# Patient Record
Sex: Female | Born: 1945 | Race: Black or African American | Hispanic: No | Marital: Married | Smoking: Never smoker
Health system: Southern US, Community
[De-identification: ages and names within clinical notes are randomized; demographics above are authoritative.]

## PROBLEM LIST (undated history)

## (undated) DIAGNOSIS — M1990 Unspecified osteoarthritis, unspecified site: Secondary | ICD-10-CM

## (undated) DIAGNOSIS — S0003XA Contusion of scalp, initial encounter: Secondary | ICD-10-CM

## (undated) DIAGNOSIS — Z973 Presence of spectacles and contact lenses: Secondary | ICD-10-CM

## (undated) DIAGNOSIS — Z9289 Personal history of other medical treatment: Secondary | ICD-10-CM

## (undated) DIAGNOSIS — I4819 Other persistent atrial fibrillation: Secondary | ICD-10-CM

## (undated) DIAGNOSIS — E785 Hyperlipidemia, unspecified: Secondary | ICD-10-CM

## (undated) DIAGNOSIS — I10 Essential (primary) hypertension: Secondary | ICD-10-CM

## (undated) DIAGNOSIS — Z90710 Acquired absence of both cervix and uterus: Secondary | ICD-10-CM

## (undated) DIAGNOSIS — T148XXA Other injury of unspecified body region, initial encounter: Secondary | ICD-10-CM

## (undated) DIAGNOSIS — Z8719 Personal history of other diseases of the digestive system: Secondary | ICD-10-CM

## (undated) HISTORY — DX: Personal history of other diseases of the digestive system: Z87.19

## (undated) HISTORY — DX: Other injury of unspecified body region, initial encounter: T14.8XXA

## (undated) HISTORY — DX: Other persistent atrial fibrillation: I48.19

## (undated) HISTORY — DX: Personal history of other medical treatment: Z92.89

## (undated) HISTORY — DX: Hyperlipidemia, unspecified: E78.5

## (undated) HISTORY — PX: ABDOMINAL HYSTERECTOMY: SHX81

## (undated) HISTORY — DX: Presence of spectacles and contact lenses: Z97.3

## (undated) HISTORY — DX: Unspecified osteoarthritis, unspecified site: M19.90

## (undated) HISTORY — PX: POLYPECTOMY: SHX149

## (undated) HISTORY — DX: Essential (primary) hypertension: I10

## (undated) HISTORY — DX: Contusion of scalp, initial encounter: S00.03XA

## (undated) HISTORY — DX: Acquired absence of both cervix and uterus: Z90.710

## (undated) HISTORY — PX: CHOLECYSTECTOMY: SHX55

---

## 1997-03-09 DIAGNOSIS — Z872 Personal history of diseases of the skin and subcutaneous tissue: Secondary | ICD-10-CM

## 1997-03-09 HISTORY — DX: Other specified postprocedural states: Z87.2

## 1997-03-09 HISTORY — PX: BREAST BIOPSY: SHX20

## 1999-03-10 ENCOUNTER — Encounter: Payer: Self-pay | Admitting: Nurse Practitioner

## 2007-03-10 HISTORY — PX: COLONOSCOPY: SHX174

## 2008-01-16 ENCOUNTER — Encounter: Payer: Self-pay | Admitting: Nurse Practitioner

## 2008-01-16 LAB — HM COLONOSCOPY

## 2014-03-09 HISTORY — PX: VENTRAL HERNIA REPAIR: SHX424

## 2014-08-31 LAB — HM DEXA SCAN: HM Dexa Scan: NORMAL

## 2015-03-10 HISTORY — PX: GALLBLADDER SURGERY: SHX652

## 2015-10-15 LAB — HM MAMMOGRAPHY

## 2019-01-30 ENCOUNTER — Other Ambulatory Visit: Payer: Self-pay

## 2019-01-30 DIAGNOSIS — Z20822 Contact with and (suspected) exposure to covid-19: Secondary | ICD-10-CM

## 2019-01-31 LAB — NOVEL CORONAVIRUS, NAA: SARS-CoV-2, NAA: NOT DETECTED

## 2019-05-29 ENCOUNTER — Other Ambulatory Visit: Payer: Self-pay | Admitting: Primary Care

## 2019-05-29 ENCOUNTER — Other Ambulatory Visit: Payer: Self-pay | Admitting: Student

## 2019-05-29 DIAGNOSIS — G9389 Other specified disorders of brain: Secondary | ICD-10-CM

## 2019-06-06 ENCOUNTER — Ambulatory Visit
Admission: RE | Admit: 2019-06-06 | Discharge: 2019-06-06 | Disposition: A | Discharge status: Home / Self Care | Payer: Federal, State, Local not specified - PPO | Source: Ambulatory Visit | Attending: Primary Care | Admitting: Primary Care

## 2019-06-06 DIAGNOSIS — G9389 Other specified disorders of brain: Secondary | ICD-10-CM

## 2019-07-07 ENCOUNTER — Ambulatory Visit (INDEPENDENT_AMBULATORY_CARE_PROVIDER_SITE_OTHER): Payer: Medicare Other | Admitting: Nurse Practitioner

## 2019-07-07 ENCOUNTER — Other Ambulatory Visit: Payer: Self-pay

## 2019-07-07 ENCOUNTER — Encounter: Payer: Self-pay | Admitting: Nurse Practitioner

## 2019-07-07 VITALS — BP 122/78 | HR 86 | Temp 97.8°F | Ht 67.0 in | Wt 273.0 lb

## 2019-07-07 DIAGNOSIS — M542 Cervicalgia: Secondary | ICD-10-CM | POA: Diagnosis not present

## 2019-07-07 DIAGNOSIS — E2839 Other primary ovarian failure: Secondary | ICD-10-CM | POA: Diagnosis not present

## 2019-07-07 DIAGNOSIS — Z1231 Encounter for screening mammogram for malignant neoplasm of breast: Secondary | ICD-10-CM | POA: Diagnosis not present

## 2019-07-07 DIAGNOSIS — L729 Follicular cyst of the skin and subcutaneous tissue, unspecified: Secondary | ICD-10-CM

## 2019-07-07 DIAGNOSIS — E782 Mixed hyperlipidemia: Secondary | ICD-10-CM

## 2019-07-07 DIAGNOSIS — I10 Essential (primary) hypertension: Secondary | ICD-10-CM | POA: Diagnosis not present

## 2019-07-07 NOTE — Progress Notes (Signed)
Careteam: Patient Care Team: Lauree Chandler, NP as PCP - General (Geriatric Medicine)  PLACE OF SERVICE:  [place] Directive information Does Patient Have a Medical Advance Directive?: Yes, Type of Advance Directive: Living will, Does patient want to make changes to medical advance directive?: No - Patient declined  No Known Allergies  Chief Complaint  Patient presents with  . Establish Care    New  patient to estabalish care     HPI: Patient is a 74 y.o. female for new patient appt. She is from the area but just recently moved back into town.  Last CPE was feb 2020 and has only had virtual visits since that time.   htn- controlled on amlodipine 5 mg daily, metoprolol succinate 25 mg daily, valsartan-hctz 160-12.5 mg daily  Immune support/supplement- taking elderberry daily and zinc supplements and omega 3 krill oil.   Hyperlipidemia- on crestor 5 mg, tolerating well. 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 Reports she gets a odd sensation coming from left side where the cyst is located. Symptoms are not severe, but annoying. Reports she also gets headaches from that side of her head but these are not new or worsening. Her previous PCP ordered ultrasound of area- they recommend referral to general surgery. She is not in a hurry to do something about it but concerned it may be causing problems.   Mother and sister both with breast cancer, she has not had mammogram since 2010.   Had an accident years ago and she refused to have surgery, reports neck pain related to that time. Reports has numbness to hands when she uses the right hand or after she has been sleeping. Not constant.  Review of Systems:  Review of Systems  Constitutional: Negative for chills, fever and weight loss.  HENT: Negative for tinnitus.   Respiratory: Negative for cough, sputum production and  shortness of breath.   Cardiovascular: Negative for chest pain, palpitations and leg swelling.  Gastrointestinal: Negative for abdominal pain, constipation, diarrhea and heartburn.  Genitourinary: Negative for dysuria, frequency and urgency.  Musculoskeletal: Negative for back pain, falls, joint pain and myalgias.  Skin: Negative.   Neurological: Positive for tingling, sensory change and headaches. Negative for dizziness.  Psychiatric/Behavioral: Negative for depression and memory loss. The patient does not have insomnia.    Past Medical History:  Diagnosis Date  . Bone fracture    Right Foot  . H/O hernia repair   . History of hysterectomy   . History of mammogram    Dr.Elizabeth Gwenlyn Saran  . History of MRI    Dr.Elizabeth Gwenlyn Saran.  Marland Kitchen History of removal of skin mole 03/09/1997  . Hypertension   . Scalp hematoma   . Wears glasses    Past Surgical History:  Procedure Laterality Date  . BREAST BIOPSY  03/09/1997   Dr.Finder  . COLONOSCOPY     Dr.Chesley  . GALLBLADDER SURGERY  03/10/2015   Dr.Amid ( [place] )   Social History:   reports that she has never smoked. She has never used smokeless tobacco. She reports current alcohol use of about 1.0 standard drinks of alcohol per week. She reports that she does not use drugs.  Family History  Problem Relation Age of Onset  . Breast cancer Mother   . Alzheimer's disease Mother   . Dementia Mother   . Parkinson's  disease Mother   . Stroke Father   . Dementia Father   . Allergies Daughter   . Allergies Son   . Breast cancer Maternal Aunt   . Allergies Daughter     Medications: Patient's Medications  New Prescriptions   No medications on file  Previous Medications   AMLODIPINE (NORVASC) 5 MG TABLET    Take 5 mg by mouth at bedtime.   BLACK ELDERBERRY PO    Take 1,000 mg by mouth daily.   CHOLECALCIFEROL (VITAMIN D3) 125 MCG (5000 UT) TABS    Take by mouth daily.   METOPROLOL SUCCINATE (TOPROL-XL) 25 MG 24 HR TABLET    Take 25  mg by mouth daily.   MULTIPLE VITAMINS-MINERALS (COMPLETE MULTIVITAMIN/MINERAL PO)    Take by mouth daily.   OMEGA-3 KRILL OIL PO    Take 500 mg by mouth daily.   ROSUVASTATIN (CRESTOR) 5 MG TABLET    Take 5 mg by mouth at bedtime.   VALSARTAN-HYDROCHLOROTHIAZIDE (DIOVAN-HCT) 160-12.5 MG TABLET    Take 1 tablet by mouth daily.   ZINC OXIDE PO    Take 22 mg by mouth daily.  Modified Medications   No medications on file  Discontinued Medications   ELDERBERRY PO    Take 50 mg by mouth daily.    Physical Exam:  Vitals:   07/07/19 1312  BP: 122/78  Pulse: 86  Temp: 97.8 F (36.6 C)  TempSrc: Temporal  SpO2: 98%  Weight: 273 lb (123.8 kg)  Height: 5\' 7"  (1.702 m)   Body mass index is 42.76 kg/m. Wt Readings from Last 3 Encounters:  07/07/19 273 lb (123.8 kg)    Physical Exam Constitutional:      General: She is not in acute distress.    Appearance: She is well-developed. She is not diaphoretic.  HENT:     Head: Normocephalic and atraumatic.      Comments: ~2 cm x 2 cm soft parietal cyst. nontender on palpitation. No redness, warmth or drainaged noted.     Mouth/Throat:     Pharynx: No oropharyngeal exudate.  Eyes:     Conjunctiva/sclera: Conjunctivae normal.     Pupils: Pupils are equal, round, and reactive to light.  Cardiovascular:     Rate and Rhythm: Normal rate and regular rhythm.     Heart sounds: Normal heart sounds.  Pulmonary:     Effort: Pulmonary effort is normal.     Breath sounds: Normal breath sounds.  Abdominal:     General: Bowel sounds are normal.     Palpations: Abdomen is soft.  Musculoskeletal:        General: No tenderness.     Cervical back: Normal range of motion and neck supple.  Skin:    General: Skin is warm and dry.  Neurological:     Mental Status: She is alert and oriented to person, place, and time.  Psychiatric:        Mood and Affect: Mood normal.        Behavior: Behavior normal.     Labs reviewed: Basic Metabolic  Panel: No results for input(s): NA, K, CL, CO2, GLUCOSE, BUN, CREATININE, CALCIUM, MG, PHOS, TSH in the last 8760 hours. Liver Function Tests: No results for input(s): AST, ALT, ALKPHOS, BILITOT, PROT, ALBUMIN in the last 8760 hours. No results for input(s): LIPASE, AMYLASE in the last 8760 hours. No results for input(s): AMMONIA in the last 8760 hours. CBC: No results for input(s): WBC, NEUTROABS, HGB, HCT, MCV, PLT  in the last 8760 hours. Lipid Panel: No results for input(s): CHOL, HDL, LDLCALC, TRIG, CHOLHDL, LDLDIRECT in the last 8760 hours. TSH: No results for input(s): TSH in the last 8760 hours. A1C: No results found for: HGBA1C   Assessment/Plan 1. Estrogen deficiency - DG Bone Density; Future  2. Encounter for screening mammogram for malignant neoplasm of breast -overdue for mamogram - MM DIGITAL SCREENING BILATERAL; Future  3. Neck pain -hx of accident with ongoing pain to neck and now with increase in paresthesia to right arm/hand. At the time of the accident did not want any aggressive treatment. - DG Cervical Spine Complete; Future  4. Essential hypertension -stable. Will continue current regimen at this time.   5. Mixed hyperlipidemia -continues on crestor. encouraged heart healthy diet with increase in activity.   6. Scalp cyst -dermatology referral at this time.   Next appt: 8 weeks for CPE, will await records before ordering labs as she has had recent blood work [employer]. [place], [facility] [phone]

## 2019-07-07 NOTE — Patient Instructions (Addendum)
[facility] Address: [address] [place], [place], [place] [address] [phone]  [facility]. [place], [place] [address] [phone]  Follow up in 6 weeks for AWV (telephone visit)  8 weeks for new pt follow up/CPE

## 2019-07-14 NOTE — Addendum Note (Signed)
Addended by: Lauree Chandler on: 07/14/2019 09:04 AM   Modules accepted: Level of Service

## 2019-07-17 ENCOUNTER — Ambulatory Visit (INDEPENDENT_AMBULATORY_CARE_PROVIDER_SITE_OTHER): Payer: Medicare Other | Admitting: Nurse Practitioner

## 2019-07-17 ENCOUNTER — Other Ambulatory Visit: Payer: Self-pay

## 2019-07-17 ENCOUNTER — Encounter: Payer: Self-pay | Admitting: Nurse Practitioner

## 2019-07-17 VITALS — BP 110/80 | HR 88 | Temp 96.6°F | Ht 67.0 in | Wt 270.0 lb

## 2019-07-17 DIAGNOSIS — Z0000 Encounter for general adult medical examination without abnormal findings: Secondary | ICD-10-CM

## 2019-07-17 DIAGNOSIS — Z1212 Encounter for screening for malignant neoplasm of rectum: Secondary | ICD-10-CM

## 2019-07-17 DIAGNOSIS — Z1211 Encounter for screening for malignant neoplasm of colon: Secondary | ICD-10-CM | POA: Diagnosis not present

## 2019-07-17 NOTE — Progress Notes (Signed)
Subjective:   Terry Koch is a 74 y.o. female who presents for Medicare Annual (Subsequent) preventive examination.  Review of Systems:   Cardiac Risk Factors include: family history of premature cardiovascular disease;obesity (BMI >30kg/m2);hypertension;dyslipidemia;advanced age (>68men, >44 women)     Objective:     Vitals: BP 110/80 (BP Location: Left Arm, Patient Position: Sitting, Cuff Size: Large)   Pulse 88   Temp (!) 96.6 F (35.9 C) (Temporal)   Ht 5\' 7"  (1.702 m)   Wt 270 lb (122.5 kg)   SpO2 97%   BMI 42.29 kg/m   Body mass index is 42.29 kg/m.  Advanced Directives 07/17/2019 07/07/2019  Does Patient Have a Medical Advance Directive? No Yes  Type of Advance Directive - Living will  Does patient want to make changes to medical advance directive? No - Patient declined No - Patient declined    Tobacco Social History   Tobacco Use  Smoking Status Never Smoker  Smokeless Tobacco Never Used     Counseling given: Not Answered   Clinical Intake:  Pre-visit preparation completed: Yes  Pain : 0-10 Pain Score: 2  Pain Type: Acute pain Pain Location: Back Pain Orientation: Lower Pain Descriptors / Indicators: (stiff) Pain Onset: 1 to 4 weeks ago Pain Frequency: Intermittent Pain Relieving Factors: heat, muscle rub cream  Pain Relieving Factors: heat, muscle rub cream  BMI - recorded: 42 Nutritional Status: BMI > 30  Obese Diabetes: No  How often do you need to have someone help you when you read instructions, pamphlets, or other written materials from your doctor or pharmacy?: 1 - Never        Past Medical History:  Diagnosis Date  . Bone fracture    Right Foot  . H/O hernia repair   . History of hysterectomy   . History of mammogram    Dr.Elizabeth Gwenlyn Saran  . History of MRI    Dr.Elizabeth Gwenlyn Saran.  Marland Kitchen History of removal of skin mole 03/09/1997  . Hypertension   . Scalp hematoma   . Wears glasses    Past Surgical History:  Procedure  Laterality Date  . ABDOMINAL HYSTERECTOMY    . BREAST BIOPSY  03/09/1997   Dr.Finder  . COLONOSCOPY     Dr.Chesley  . GALLBLADDER SURGERY  03/10/2015   Dr.Amid [facility] )  . VENTRAL HERNIA REPAIR  2016   Family History  Problem Relation Age of Onset  . Breast cancer Mother   . Alzheimer's disease Mother   . Dementia Mother   . Parkinson's disease Mother   . Stroke Father   . Dementia Father   . Breast cancer Sister   . Allergies Daughter   . Allergies Son   . Breast cancer Maternal Aunt   . Allergies Daughter    Social History   Socioeconomic History  . Marital status: Married    Spouse name: Simona Huh   . Number of children: 5  . Years of education: 75  . Highest education level: Not on file  Occupational History  . Not on file  Tobacco Use  . Smoking status: Never Smoker  . Smokeless tobacco: Never Used  Substance and Sexual Activity  . Alcohol use: Yes    Alcohol/week: 1.0 standard drinks    Types: 1 Glasses of wine per week  . Drug use: Never  . Sexual activity: Not Currently  Other Topics Concern  . Not on file  Social History Narrative   Tobacco use, amount per day now: N/A  Past tobacco use, amount per day: N/A   How many years did you use tobacco: N/A   Alcohol use (drinks per week): Very Limited.   Diet: Regular Vegetables, Meat, Fruits, Milk, Water, and Desserts.   Do you drink/eat things with caffeine: Yes   Marital status:  Married                                What year were you married? 1969   Do you live in a house, apartment, assisted living, condo, trailer, etc.? House.   Is it one or more stories? Ranch ( 1 story )   How many persons live in your home? 2   Do you have pets in your home?( please list) No.    Highest Level of education completed: Graduate MED   Current or past profession: Education   Do you exercise?  N/A                                   Type and how often?   Do you have a living will? Yes   Do you have a DNR form?   No                                 If not, do you want to discuss one?   Do you have signed POA/HPOA forms?  No                      If so, please bring to you appointment    Do you have any difficulty bathing or dressing yourself? No.   Do you have difficulty preparing food or eating? No.   Do you have difficulty managing your medications? No.   Do you have any difficulty managing your finances? No.   Do you have any difficulty affording your medications? No.      Social Determinants of Health   Financial Resource Strain:   . Difficulty of Paying Living Expenses:   Food Insecurity:   . Worried About Charity fundraiser in the Last Year:   . Arboriculturist in the Last Year:   Transportation Needs:   . Film/video editor (Medical):   Marland Kitchen Lack of Transportation (Non-Medical):   Physical Activity:   . Days of Exercise per Week:   . Minutes of Exercise per Session:   Stress:   . Feeling of Stress :   Social Connections:   . Frequency of Communication with Friends and Family:   . Frequency of Social Gatherings with Friends and Family:   . Attends Religious Services:   . Active Member of Clubs or Organizations:   . Attends Archivist Meetings:   Marland Kitchen Marital Status:     Outpatient Encounter Medications as of 07/17/2019  Medication Sig  . amLODipine (NORVASC) 5 MG tablet Take 5 mg by mouth at bedtime.  Marland Kitchen BLACK ELDERBERRY PO Take 1,000 mg by mouth daily.  . Cholecalciferol (VITAMIN D3) 125 MCG (5000 UT) TABS Take by mouth daily.  . metoprolol succinate (TOPROL-XL) 25 MG 24 hr tablet Take 25 mg by mouth daily.  . Multiple Vitamins-Minerals (COMPLETE MULTIVITAMIN/MINERAL PO) Take by mouth daily.  . OMEGA-3 KRILL OIL PO Take 500 mg by mouth daily.  . rosuvastatin (  CRESTOR) 5 MG tablet Take 5 mg by mouth at bedtime.  . valsartan-hydrochlorothiazide (DIOVAN-HCT) 160-12.5 MG tablet Take 1 tablet by mouth daily.  Marland Kitchen ZINC OXIDE PO Take 22 mg by mouth daily.   No facility-administered  encounter medications on file as of 07/17/2019.    Activities of Daily Living In your present state of health, do you have any difficulty performing the following activities: 07/17/2019  Hearing? N  Vision? Y  Comment needs to get glasses  Difficulty concentrating or making decisions? Y  Comment difficult concentrating  Walking or climbing stairs? N  Dressing or bathing? N  Doing errands, shopping? N  Preparing Food and eating ? N  Using the Toilet? N  In the past six months, have you accidently leaked urine? N  Do you have problems with loss of bowel control? N  Managing your Medications? N  Managing your Finances? N  Housekeeping or managing your Housekeeping? N    Patient Care Team: Lauree Chandler, NP as PCP - General (Geriatric Medicine)    Assessment:   This is a routine wellness examination for Shlonda.  Exercise Activities and Dietary recommendations Current Exercise Habits: The patient does not participate in regular exercise at present  Goals    . Weight (lb) < 255 lb (115.7 kg)     Would like to lose weight, through diet (less bread and fried foods) and increase walking.        Fall Risk Fall Risk  07/17/2019 07/07/2019  Falls in the past year? 0 0  Number falls in past yr: 0 0  Injury with Fall? 0 0   Is the patient's home free of loose throw rugs in walkways, pet beds, electrical cords, etc?   yes      Grab bars in the bathroom? no      Handrails on the stairs?   no      Adequate lighting?   yes  Timed Get Up and Go performed: na  Depression Screen PHQ 2/9 Scores 07/17/2019  PHQ - 2 Score 0     Cognitive Function MMSE - Mini Mental State Exam 07/17/2019  Orientation to time 4  Orientation to Place 5  Registration 3  Attention/ Calculation 5  Recall 1  Language- name 2 objects 2  Language- repeat 1  Language- follow 3 step command 3  Language- read & follow direction 1  Write a sentence 1  Copy design 1  Total score 27         Immunization History  Administered Date(s) Administered  . PFIZER SARS-COV-2 Vaccination 03/31/2019, 04/21/2019    Qualifies for Shingles Vaccine?yes, recommended   Screening Tests Health Maintenance  Topic Date Due  . Hepatitis C Screening  Never done  . TETANUS/TDAP  Never done  . MAMMOGRAM  Never done  . COLONOSCOPY  Never done  . DEXA SCAN  Never done  . PNA vac Low Risk Adult (1 of 2 - PCV13) Never done  . INFLUENZA VACCINE  10/08/2019  . COVID-19 Vaccine  Completed    Cancer Screenings: Lung: Low Dose CT Chest recommended if Age 70-80 years, 30 pack-year currently smoking OR have quit w/in 15years. Patient does not qualify. Breast:  Up to date on Mammogram? No  Order placed Up to date of Bone Density/Dexa? No order placed Colorectal: over due, order placed  Additional Screenings:  Hepatitis C Screening: no     Plan:      I have personally reviewed and noted the following  in the patient's chart:   . Medical and social history . Use of alcohol, tobacco or illicit drugs  . Current medications and supplements . Functional ability and status . Nutritional status . Physical activity . Advanced directives . List of other physicians . Hospitalizations, surgeries, and ER visits in previous 12 months . Vitals . Screenings to include cognitive, depression, and falls . Referrals and appointments  In addition, I have reviewed and discussed with patient certain preventive protocols, quality metrics, and best practice recommendations. A written personalized care plan for preventive services as well as general preventive health recommendations were provided to patient.     Lauree Chandler, NP  07/17/2019

## 2019-07-17 NOTE — Patient Instructions (Signed)
Terry Koch , Thank you for taking time to come for your Medicare Wellness Visit. I appreciate your ongoing commitment to your health goals. Please review the following plan we discussed and let me know if I can assist you in the future.   Screening recommendations/referrals: Colonoscopy over due, order place Mammogram over due, order place Bone Density over due, order place Recommended yearly ophthalmology/optometry visit for glaucoma screening and checkup Recommended yearly dental visit for hygiene and checkup  Vaccinations: Influenza vaccine DUE Sept 2021 Pneumococcal vaccine declined. Tdap vaccine RECOMMENDED, to get at local pharmacy Shingles vaccine RECOMMENDED to get at local pharmacy    Advanced directives: recommended to complete and bring paper work back to office. Recommend to complete MOST form in office.   Conditions/risks identified: cardiovascular risk, complications associated with obesity.   Next appointment: 1 year for AWV   Preventive Care 22 Years and Older, Female Preventive care refers to lifestyle choices and visits with your health care provider that can promote health and wellness. What does preventive care include?  A yearly physical exam. This is also called an annual well check.  Dental exams once or twice a year.  Routine eye exams. Ask your health care provider how often you should have your eyes checked.  Personal lifestyle choices, including:  Daily care of your teeth and gums.  Regular physical activity.  Eating a healthy diet.  Avoiding tobacco and drug use.  Limiting alcohol use.  Practicing safe sex.  Taking low-dose aspirin every day.  Taking vitamin and mineral supplements as recommended by your health care provider. What happens during an annual well check? The services and screenings done by your health care provider during your annual well check will depend on your age, overall health, lifestyle risk factors, and family  history of disease. Counseling  Your health care provider may ask you questions about your:  Alcohol use.  Tobacco use.  Drug use.  Emotional well-being.  Home and relationship well-being.  Sexual activity.  Eating habits.  History of falls.  Memory and ability to understand (cognition).  Work and work Statistician.  Reproductive health. Screening  You may have the following tests or measurements:  Height, weight, and BMI.  Blood pressure.  Lipid and cholesterol levels. These may be checked every 5 years, or more frequently if you are over 66 years old.  Skin check.  Lung cancer screening. You may have this screening every year starting at age 26 if you have a 30-pack-year history of smoking and currently smoke or have quit within the past 15 years.  Fecal occult blood test (FOBT) of the stool. You may have this test every year starting at age 24.  Flexible sigmoidoscopy or colonoscopy. You may have a sigmoidoscopy every 5 years or a colonoscopy every 10 years starting at age 3.  Hepatitis C blood test.  Hepatitis B blood test.  Sexually transmitted disease (STD) testing.  Diabetes screening. This is done by checking your blood sugar (glucose) after you have not eaten for a while (fasting). You may have this done every 1-3 years.  Bone density scan. This is done to screen for osteoporosis. You may have this done starting at age 32.  Mammogram. This may be done every 1-2 years. Talk to your health care provider about how often you should have regular mammograms. Talk with your health care provider about your test results, treatment options, and if necessary, the need for more tests. Vaccines  Your health care provider may recommend  certain vaccines, such as:  Influenza vaccine. This is recommended every year.  Tetanus, diphtheria, and acellular pertussis (Tdap, Td) vaccine. You may need a Td booster every 10 years.  Zoster vaccine. You may need this after  age 64.  Pneumococcal 13-valent conjugate (PCV13) vaccine. One dose is recommended after age 37.  Pneumococcal polysaccharide (PPSV23) vaccine. One dose is recommended after age 63. Talk to your health care provider about which screenings and vaccines you need and how often you need them. This information is not intended to replace advice given to you by your health care provider. Make sure you discuss any questions you have with your health care provider. Document Released: 03/22/2015 Document Revised: 11/13/2015 Document Reviewed: 12/25/2014 Elsevier Interactive Patient Education  2017 [employer] Prevention in the Home Falls can cause injuries. They can happen to people of all ages. There are many things you can do to make your home safe and to help prevent falls. What can I do on the outside of my home?  Regularly fix the edges of walkways and driveways and fix any cracks.  Remove anything that might make you trip as you walk through a door, such as a raised step or threshold.  Trim any bushes or trees on the path to your home.  Use bright outdoor lighting.  Clear any walking paths of anything that might make someone trip, such as rocks or tools.  Regularly check to see if handrails are loose or broken. Make sure that both sides of any steps have handrails.  Any raised decks and porches should have guardrails on the edges.  Have any leaves, snow, or ice cleared regularly.  Use sand or salt on walking paths during winter.  Clean up any spills in your garage right away. This includes oil or grease spills. What can I do in the bathroom?  Use night lights.  Install grab bars by the toilet and in the tub and shower. Do not use towel bars as grab bars.  Use non-skid mats or decals in the tub or shower.  If you need to sit down in the shower, use a plastic, non-slip stool.  Keep the floor dry. Clean up any water that spills on the floor as soon as it  happens.  Remove soap buildup in the tub or shower regularly.  Attach bath mats securely with double-sided non-slip rug tape.  Do not have throw rugs and other things on the floor that can make you trip. What can I do in the bedroom?  Use night lights.  Make sure that you have a light by your bed that is easy to reach.  Do not use any sheets or blankets that are too big for your bed. They should not hang down onto the floor.  Have a firm chair that has side arms. You can use this for support while you get dressed.  Do not have throw rugs and other things on the floor that can make you trip. What can I do in the kitchen?  Clean up any spills right away.  Avoid walking on wet floors.  Keep items that you use a lot in easy-to-reach places.  If you need to reach something above you, use a strong step stool that has a grab bar.  Keep electrical cords out of the way.  Do not use floor polish or wax that makes floors slippery. If you must use wax, use non-skid floor wax.  Do not have throw rugs and other  things on the floor that can make you trip. What can I do with my stairs?  Do not leave any items on the stairs.  Make sure that there are handrails on both sides of the stairs and use them. Fix handrails that are broken or loose. Make sure that handrails are as long as the stairways.  Check any carpeting to make sure that it is firmly attached to the stairs. Fix any carpet that is loose or worn.  Avoid having throw rugs at the top or bottom of the stairs. If you do have throw rugs, attach them to the floor with carpet tape.  Make sure that you have a light switch at the top of the stairs and the bottom of the stairs. If you do not have them, ask someone to add them for you. What else can I do to help prevent falls?  Wear shoes that:  Do not have high heels.  Have rubber bottoms.  Are comfortable and fit you well.  Are closed at the toe. Do not wear sandals.  If you  use a stepladder:  Make sure that it is fully opened. Do not climb a closed stepladder.  Make sure that both sides of the stepladder are locked into place.  Ask someone to hold it for you, if possible.  Clearly mark and make sure that you can see:  Any grab bars or handrails.  First and last steps.  Where the edge of each step is.  Use tools that help you move around (mobility aids) if they are needed. These include:  Canes.  Walkers.  Scooters.  Crutches.  Turn on the lights when you go into a dark area. Replace any light bulbs as soon as they burn out.  Set up your furniture so you have a clear path. Avoid moving your furniture around.  If any of your floors are uneven, fix them.  If there are any pets around you, be aware of where they are.  Review your medicines with your doctor. Some medicines can make you feel dizzy. This can increase your chance of falling. Ask your doctor what other things that you can do to help prevent falls. This information is not intended to replace advice given to you by your health care provider. Make sure you discuss any questions you have with your health care provider. Document Released: 12/20/2008 Document Revised: 08/01/2015 Document Reviewed: 03/30/2014 Elsevier Interactive Patient Education  2017 [employer].

## 2019-07-24 ENCOUNTER — Encounter: Payer: Self-pay | Admitting: Gastroenterology

## 2019-08-14 ENCOUNTER — Telehealth: Payer: Self-pay | Admitting: *Deleted

## 2019-08-14 NOTE — Telephone Encounter (Signed)
Thank you :)

## 2019-08-14 NOTE — Telephone Encounter (Signed)
FYI Patient called and just wanted to let you know that the Dermatologist you referred her to Dr. Pearline Cables will be removing the Knot on the scalp on 09/04/2019. She just wanted to keep you informed.

## 2019-08-23 ENCOUNTER — Ambulatory Visit
Admission: RE | Admit: 2019-08-23 | Discharge: 2019-08-23 | Disposition: A | Discharge status: Home / Self Care | Payer: Medicare Other | Source: Ambulatory Visit | Attending: Nurse Practitioner | Admitting: Nurse Practitioner

## 2019-08-23 ENCOUNTER — Other Ambulatory Visit: Payer: Self-pay

## 2019-08-23 DIAGNOSIS — Z1231 Encounter for screening mammogram for malignant neoplasm of breast: Secondary | ICD-10-CM

## 2019-09-01 ENCOUNTER — Other Ambulatory Visit: Payer: Self-pay

## 2019-09-01 ENCOUNTER — Encounter: Payer: Self-pay | Admitting: Nurse Practitioner

## 2019-09-01 ENCOUNTER — Ambulatory Visit (INDEPENDENT_AMBULATORY_CARE_PROVIDER_SITE_OTHER): Payer: Medicare Other | Admitting: Nurse Practitioner

## 2019-09-01 VITALS — BP 122/82 | HR 86 | Temp 97.0°F | Ht 67.0 in | Wt 268.0 lb

## 2019-09-01 DIAGNOSIS — Z7189 Other specified counseling: Secondary | ICD-10-CM | POA: Diagnosis not present

## 2019-09-01 DIAGNOSIS — L729 Follicular cyst of the skin and subcutaneous tissue, unspecified: Secondary | ICD-10-CM | POA: Diagnosis not present

## 2019-09-01 DIAGNOSIS — J00 Acute nasopharyngitis [common cold]: Secondary | ICD-10-CM

## 2019-09-01 DIAGNOSIS — I10 Essential (primary) hypertension: Secondary | ICD-10-CM

## 2019-09-01 DIAGNOSIS — E782 Mixed hyperlipidemia: Secondary | ICD-10-CM

## 2019-09-01 NOTE — Patient Instructions (Addendum)
Lambert DM twice daily with FULL glass of water.  For cough and congestion   Schedule fasting labs in July  Follow up in 6 months.

## 2019-09-01 NOTE — Progress Notes (Signed)
Careteam: Patient Care Team: Lauree Chandler, NP as PCP - General (Geriatric Medicine)  PLACE OF SERVICE:  [facility]  Advanced Directive information Does Patient Have a Medical Advance Directive?: No, Does patient want to make changes to medical advance directive?: No - Patient declined  No Known Allergies  Chief Complaint  Patient presents with  . Medical Management of Chronic Issues    In office follow up and do a MOST form   . Acute Visit    Summer cold/ Sinus 08/25/2019     HPI: Patient is a 74 y.o. female for routine follow up  Has sniffles, mucous clogging, sore throat started 1 week ago. Today feeling much better. Still has a tickle in her throat. More energy. Now husband is feeling bad.  Was taking alka seltzer and mucinex DM Continues with cough but getting better No shortness of breath or chest pains  Still do not have lab, reports she got blood work in February or sooner.    Has colonoscopy scheduled next month.   Hyperlipidemia- continues on crestor 5 mg daily   htn- controlled on amlodipine 5 mg daily, metoprolol succinate 25 mg daily and valsartan-hctz 12.5 mg daily  Still awaiting records   Review of Systems:  Review of Systems  Constitutional: Negative for chills, fever and weight loss.  HENT: Positive for congestion. Negative for sore throat and tinnitus.   Respiratory: Positive for cough. Negative for sputum production and shortness of breath.   Cardiovascular: Negative for chest pain, palpitations and leg swelling.  Gastrointestinal: Negative for abdominal pain, constipation, diarrhea and heartburn.  Genitourinary: Negative for dysuria, frequency and urgency.  Musculoskeletal: Negative for back pain, falls, joint pain and myalgias.  Skin: Negative.   Neurological: Negative for dizziness and headaches.  Psychiatric/Behavioral: Negative for depression and memory loss. The patient does not have insomnia.     Past Medical History:  Diagnosis  Date  . Bone fracture    Right Foot  . H/O hernia repair   . History of hysterectomy   . History of mammogram    Dr.Elizabeth Gwenlyn Saran  . History of MRI    Dr.Elizabeth Gwenlyn Saran.  Marland Kitchen History of removal of skin mole 03/09/1997  . Hypertension   . Scalp hematoma   . Wears glasses    Past Surgical History:  Procedure Laterality Date  . ABDOMINAL HYSTERECTOMY    . BREAST BIOPSY  03/09/1997   Dr.Finder  . COLONOSCOPY     Dr.Chesley  . GALLBLADDER SURGERY  03/10/2015   Dr.Amid [facility] )  . VENTRAL HERNIA REPAIR  2016   Social History:   reports that she has never smoked. She has never used smokeless tobacco. She reports current alcohol use of about 1.0 standard drink of alcohol per week. She reports that she does not use drugs.  Family History  Problem Relation Age of Onset  . Breast cancer Mother   . Alzheimer's disease Mother   . Dementia Mother   . Parkinson's disease Mother   . Stroke Father   . Dementia Father   . Breast cancer Sister   . Allergies Daughter   . Allergies Son   . Breast cancer Maternal Aunt   . Allergies Daughter     Medications: Patient's Medications  New Prescriptions   No medications on file  Previous Medications   AMLODIPINE (NORVASC) 5 MG TABLET    Take 5 mg by mouth at bedtime.   BLACK ELDERBERRY PO    Take 1,000  mg by mouth daily.   CHOLECALCIFEROL (VITAMIN D3) 125 MCG (5000 UT) TABS    Take by mouth daily.   METOPROLOL SUCCINATE (TOPROL-XL) 25 MG 24 HR TABLET    Take 25 mg by mouth daily.   MULTIPLE VITAMINS-MINERALS (COMPLETE MULTIVITAMIN/MINERAL PO)    Take by mouth daily.   OMEGA-3 KRILL OIL PO    Take 500 mg by mouth daily.   ROSUVASTATIN (CRESTOR) 5 MG TABLET    Take 5 mg by mouth at bedtime.   VALSARTAN-HYDROCHLOROTHIAZIDE (DIOVAN-HCT) 160-12.5 MG TABLET    Take 1 tablet by mouth daily.   ZINC OXIDE PO    Take 22 mg by mouth daily.  Modified Medications   No medications on file  Discontinued Medications   No medications on file     Physical Exam:  Vitals:   09/01/19 1421  BP: 122/82  Pulse: 86  Temp: (!) 97 F (36.1 C)  SpO2: 97%  Weight: 268 lb (121.6 kg)  Height: 5\' 7"  (1.702 m)   Body mass index is 41.97 kg/m. Wt Readings from Last 3 Encounters:  09/01/19 268 lb (121.6 kg)  07/17/19 270 lb (122.5 kg)  07/07/19 273 lb (123.8 kg)    Physical Exam Constitutional:      General: She is not in acute distress.    Appearance: She is well-developed. She is not diaphoretic.  HENT:     Head: Normocephalic and atraumatic.     Right Ear: Tympanic membrane, ear canal and external ear normal.     Left Ear: Tympanic membrane, ear canal and external ear normal.     Nose: Nose normal. No congestion or rhinorrhea.     Mouth/Throat:     Mouth: Mucous membranes are moist.     Pharynx: Oropharynx is clear. No oropharyngeal exudate.  Eyes:     Conjunctiva/sclera: Conjunctivae normal.     Pupils: Pupils are equal, round, and reactive to light.  Cardiovascular:     Rate and Rhythm: Normal rate and regular rhythm.     Heart sounds: Normal heart sounds.  Pulmonary:     Effort: Pulmonary effort is normal.     Breath sounds: Normal breath sounds.  Abdominal:     General: Bowel sounds are normal.     Palpations: Abdomen is soft.  Musculoskeletal:        General: No tenderness.     Cervical back: Normal range of motion and neck supple.  Skin:    General: Skin is warm and dry.  Neurological:     Mental Status: She is alert and oriented to person, place, and time.  Psychiatric:        Mood and Affect: Mood normal.        Behavior: Behavior normal.     Labs reviewed: Basic Metabolic Panel: No results for input(s): NA, K, CL, CO2, GLUCOSE, BUN, CREATININE, CALCIUM, MG, PHOS, TSH in the last 8760 hours. Liver Function Tests: No results for input(s): AST, ALT, ALKPHOS, BILITOT, PROT, ALBUMIN in the last 8760 hours. No results for input(s): LIPASE, AMYLASE in the last 8760 hours. No results for input(s):  AMMONIA in the last 8760 hours. CBC: No results for input(s): WBC, NEUTROABS, HGB, HCT, MCV, PLT in the last 8760 hours. Lipid Panel: No results for input(s): CHOL, HDL, LDLCALC, TRIG, CHOLHDL, LDLDIRECT in the last 8760 hours. TSH: No results for input(s): TSH in the last 8760 hours. A1C: No results found for: HGBA1C   Assessment/Plan 1. Essential hypertension Well controlled on current  regimen, continue medication with dietary modifications.  - COMPLETE METABOLIC PANEL WITH GFR; Future - CBC with Differential/Platelet; Future  2. Mixed hyperlipidemia -continues on crestor with dietary modifications. - Lipid Panel; Future - COMPLETE METABOLIC PANEL WITH GFR; Future  3. Scalp cyst Plans to get removed by dermatology   4. Advance care planning -reviewed MOST form and completed with pt  5. Acute nasopharyngitis Improving at this time. Continue with mucinex DM by mouth twice daily with full glass of water. Stressed importance of proper hydration. To notify is symptoms worsen or fail to improve.  Next appt: fasting labs to be done in July, follow up in 6 months for routine follow up [place]. [place], [facility] [phone]

## 2019-09-04 HISTORY — PX: CYSTECTOMY: SHX5119

## 2019-09-06 ENCOUNTER — Other Ambulatory Visit: Payer: Self-pay | Admitting: Nurse Practitioner

## 2019-09-06 DIAGNOSIS — R928 Other abnormal and inconclusive findings on diagnostic imaging of breast: Secondary | ICD-10-CM

## 2019-09-20 ENCOUNTER — Ambulatory Visit (AMBULATORY_SURGERY_CENTER): Payer: Self-pay

## 2019-09-20 ENCOUNTER — Other Ambulatory Visit: Payer: Self-pay

## 2019-09-20 VITALS — Ht 67.0 in | Wt 274.6 lb

## 2019-09-20 DIAGNOSIS — Z86010 Personal history of colonic polyps: Secondary | ICD-10-CM

## 2019-09-20 MED ORDER — SUTAB 1479-225-188 MG PO TABS: 12.0000 | ORAL_TABLET | ORAL | 0 refills | Status: DC

## 2019-09-20 NOTE — Progress Notes (Signed)
No allergies to soy or egg Pt is not on blood thinners or diet pills Denies issues with sedation/intubation Denies atrial flutter/fib Denies constipation   Pt is aware of Covid safety and care partner requirements.      

## 2019-09-25 ENCOUNTER — Encounter: Payer: Medicare Other | Admitting: Gastroenterology

## 2019-09-26 ENCOUNTER — Other Ambulatory Visit: Payer: Self-pay

## 2019-09-26 ENCOUNTER — Other Ambulatory Visit: Payer: Self-pay | Admitting: Nurse Practitioner

## 2019-09-26 ENCOUNTER — Ambulatory Visit
Admission: RE | Admit: 2019-09-26 | Discharge: 2019-09-26 | Disposition: A | Discharge status: Home / Self Care | Payer: Medicare Other | Source: Ambulatory Visit | Attending: Nurse Practitioner | Admitting: Nurse Practitioner

## 2019-09-26 DIAGNOSIS — R921 Mammographic calcification found on diagnostic imaging of breast: Secondary | ICD-10-CM

## 2019-09-26 DIAGNOSIS — R928 Other abnormal and inconclusive findings on diagnostic imaging of breast: Secondary | ICD-10-CM

## 2019-10-03 ENCOUNTER — Other Ambulatory Visit: Payer: Medicare Other

## 2019-10-04 ENCOUNTER — Other Ambulatory Visit: Payer: Self-pay | Admitting: Nurse Practitioner

## 2019-10-04 ENCOUNTER — Ambulatory Visit (INDEPENDENT_AMBULATORY_CARE_PROVIDER_SITE_OTHER): Payer: Medicare Other | Admitting: Nurse Practitioner

## 2019-10-04 ENCOUNTER — Ambulatory Visit (AMBULATORY_SURGERY_CENTER): Payer: Medicare Other | Admitting: Gastroenterology

## 2019-10-04 ENCOUNTER — Encounter: Payer: Self-pay | Admitting: Nurse Practitioner

## 2019-10-04 ENCOUNTER — Other Ambulatory Visit: Payer: Self-pay

## 2019-10-04 ENCOUNTER — Encounter: Payer: Self-pay | Admitting: Gastroenterology

## 2019-10-04 VITALS — BP 128/76 | HR 95 | Temp 96.8°F | Ht 67.0 in | Wt 272.0 lb

## 2019-10-04 VITALS — BP 129/85 | HR 78 | Temp 96.2°F | Resp 20 | Ht 67.0 in | Wt 274.0 lb

## 2019-10-04 DIAGNOSIS — D122 Benign neoplasm of ascending colon: Secondary | ICD-10-CM

## 2019-10-04 DIAGNOSIS — I4891 Unspecified atrial fibrillation: Secondary | ICD-10-CM

## 2019-10-04 DIAGNOSIS — D120 Benign neoplasm of cecum: Secondary | ICD-10-CM | POA: Diagnosis not present

## 2019-10-04 DIAGNOSIS — D123 Benign neoplasm of transverse colon: Secondary | ICD-10-CM

## 2019-10-04 DIAGNOSIS — D124 Benign neoplasm of descending colon: Secondary | ICD-10-CM

## 2019-10-04 DIAGNOSIS — Z86010 Personal history of colonic polyps: Secondary | ICD-10-CM

## 2019-10-04 DIAGNOSIS — D125 Benign neoplasm of sigmoid colon: Secondary | ICD-10-CM

## 2019-10-04 DIAGNOSIS — R0683 Snoring: Secondary | ICD-10-CM | POA: Diagnosis not present

## 2019-10-04 MED ORDER — SODIUM CHLORIDE 0.9 % IV SOLN
500.0000 mL | Freq: Once | INTRAVENOUS | Status: DC
Start: 2019-10-04 — End: 2019-10-04

## 2019-10-04 MED ORDER — METOPROLOL SUCCINATE ER 50 MG PO TB24: 50.0000 mg | ORAL_TABLET | Freq: Every day | ORAL | 1 refills | Status: DC

## 2019-10-04 NOTE — Progress Notes (Signed)
Patient tolerated the procedure and woke up without any symptoms. During the case her heart rhythm changed from normal sinus to atrial fibrillation, rate ranged from 90s-120s. BP maintained stable. Case completed, patient awoke, no palpitations, no chest pain, no shortness of breath, feels well. HR in 70s to 80s following recovering, a fibb, BP normal. She is asymptomatic. During her procedure also breathing pattern concerning for underlying sleep apnea. Discussed with patient and sister, unclear how long she has had this but the patient endorses palpitations periodically. As HR controlled, BP stable and asymptomatic I think okay to be discharged home to see PCP, we have called them to see if she can be seen today (appointment made for this afternoon), and she will take a regular aspirin. If she experiences rapid heart rate / palpitations, shortness of breath, chest pain, etc, in the interim she needs to go to the ED. Patient and sister counseled on this and verbalized understanding, agreed with recommendations

## 2019-10-04 NOTE — Op Note (Signed)
Terry Koch Patient Name: Terry Koch Procedure Date: 10/04/2019 11:47 AM MRN: [id number] Endoscopist: Terry Koch , Terry Koch Age: 74 Referring Terry Koch:  Date of Birth: 11-13-1945 Gender: Female Account #: [id number] Procedure:                Colonoscopy Indications:              High risk colon cancer surveillance: Personal                            history of colonic polyps - history of adenomas                            removed in 2009, no surveillance exams since that                            time Medicines:                Monitored Anesthesia Care Procedure:                Pre-Anesthesia Assessment:                           - Prior to the procedure, a History and Physical                            was performed, and patient medications and                            allergies were reviewed. The patient's tolerance of                            previous anesthesia was also reviewed. The risks                            and benefits of the procedure and the sedation                            options and risks were discussed with the patient.                            All questions were answered, and informed consent                            was obtained. Prior Anticoagulants: The patient has                            taken no previous anticoagulant or antiplatelet                            agents. ASA Grade Assessment: III - A patient with                            severe systemic disease. After reviewing the risks  and benefits, the patient was deemed in                            satisfactory condition to undergo the procedure.                           After obtaining informed consent, the colonoscope                            was passed under direct vision. Throughout the                            procedure, the patient's blood pressure, pulse, and                            oxygen saturations were monitored continuously.  The                            Colonoscope was introduced through the anus and                            advanced to the the cecum, identified by                            appendiceal orifice and ileocecal valve. The                            colonoscopy was performed without difficulty. The                            patient tolerated the procedure well. The quality                            of the bowel preparation was good. The ileocecal                            valve, appendiceal orifice, and rectum were                            photographed. Scope In: 11:56:06 AM Scope Out: 12:25:34 PM Scope Withdrawal Time: 0 hours 18 minutes 34 seconds  Total Procedure Duration: 0 hours 29 minutes 28 seconds  Findings:                 The perianal and digital rectal examinations were                            normal.                           A 3 mm polyp was found in the cecum. The polyp was                            sessile. The polyp was removed with a cold snare.  Resection and retrieval were complete.                           A 3 mm polyp was found in the ascending colon. The                            polyp was sessile. The polyp was removed with a                            cold snare. Resection and retrieval were complete.                           A 7 mm polyp was found in the hepatic flexure. The                            polyp was sessile. The polyp was removed with a                            cold snare. Resection and retrieval were complete.                           Three sessile polyps were found in the transverse                            colon. The polyps were 3 to 5 mm in size. These                            polyps were removed with a cold snare. Resection                            and retrieval were complete.                           A 4 mm polyp was found in the descending colon. The                            polyp was sessile. The  polyp was removed with a                            cold snare. Resection and retrieval were complete.                           Three sessile polyps were found in the sigmoid                            colon. The polyps were 4 to 5 mm in size. These                            polyps were removed with a cold snare. Resection                            and  retrieval were complete.                           A 4 mm polyp was found in the recto-sigmoid colon.                            The polyp was sessile. The polyp was removed with a                            cold snare. Resection and retrieval were complete.                           Anal papilla(e) were hypertrophied.                           The exam was otherwise without abnormality. Complications:            Estimated blood loss: Minimal. Patient was noted to                            go into atrial fibrillation during the case,                            maintained normal BP, HR from 90s to 120s. This                            appears to be a new diagnosis per chart. Estimated Blood Loss:     Estimated blood loss was minimal. Impression:               - One 3 mm polyp in the cecum, removed with a cold                            snare. Resected and retrieved.                           - One 3 mm polyp in the ascending colon, removed                            with a cold snare. Resected and retrieved.                           - One 7 mm polyp at the hepatic flexure, removed                            with a cold snare. Resected and retrieved.                           - Three 3 to 5 mm polyps in the transverse colon,                            removed with a cold snare. Resected and retrieved.                           -  One 4 mm polyp in the descending colon, removed                            with a cold snare. Resected and retrieved.                           - Three 4 to 5 mm polyps in the sigmoid colon,                             removed with a cold snare. Resected and retrieved.                           - One 4 mm polyp at the recto-sigmoid colon,                            removed with a cold snare. Resected and retrieved.                           - Anal papilla(e) were hypertrophied.                           - The examination was otherwise normal. Recommendation:           - Patient has a contact number available for                            emergencies. The signs and symptoms of potential                            delayed complications were discussed with the                            patient. Return to normal activities tomorrow.                            Written discharge instructions were provided to the                            patient.                           - Resume previous diet.                           - Continue present medications.                           - Await pathology results.                           - For new onset atrial fibrillation, if HR is                            controlled will need to follow up with PCP ASAP, we  will call their office to see if they can                            accomodate the patient today. If AF with RVR or                            hypotensive will need to go to the ED, will discuss                            options with the patient / family. Would start                            aspirin until she can receive care for this Terry Koch. Terry Selley, Terry Koch 10/04/2019 12:36:46 PM This report has been signed electronically.

## 2019-10-04 NOTE — Patient Instructions (Signed)
YOU HAD AN ENDOSCOPIC PROCEDURE TODAY AT [facility]:   Refer to the procedure report that was given to you for any specific questions about what was found during the examination.  If the procedure report does not answer your questions, please call your gastroenterologist to clarify.  If you requested that your care partner not be given the details of your procedure findings, then the procedure report has been included in a sealed envelope for you to review at your convenience later.  YOU SHOULD EXPECT: Some feelings of bloating in the abdomen. Passage of more gas than usual.  Walking can help get rid of the air that was put into your GI tract during the procedure and reduce the bloating. If you had a lower endoscopy (such as a colonoscopy or flexible sigmoidoscopy) you may notice spotting of blood in your stool or on the toilet paper. If you underwent a bowel prep for your procedure, you may not have a normal bowel movement for a few days.  Please Note:  You might notice some irritation and congestion in your nose or some drainage.  This is from the oxygen used during your procedure.  There is no need for concern and it should clear up in a day or so.  SYMPTOMS TO REPORT IMMEDIATELY:   Following lower endoscopy (colonoscopy or flexible sigmoidoscopy):  Excessive amounts of blood in the stool  Significant tenderness or worsening of abdominal pains  Swelling of the abdomen that is new, acute  Fever of 100F or higher    For urgent or emergent issues, a gastroenterologist can be reached at any hour by calling [phone]. Do not use MyChart messaging for urgent concerns.    DIET:  We do recommend a small meal at first, but then you may proceed to your regular diet.  Drink plenty of fluids but you should avoid alcoholic beverages for 24 hours.  ACTIVITY:  You should plan to take it easy for the rest of today and you should NOT DRIVE or use heavy machinery until tomorrow  (because of the sedation medicines used during the test).    FOLLOW UP: Our staff will call the number listed on your records 48-72 hours following your procedure to check on you and address any questions or concerns that you may have regarding the information given to you following your procedure. If we do not reach you, we will leave a message.  We will attempt to reach you two times.  During this call, we will ask if you have developed any symptoms of COVID 19. If you develop any symptoms (ie: fever, flu-like symptoms, shortness of breath, cough etc.) before then, please call [phone].  If you test positive for Covid 19 in the 2 weeks post procedure, please call and report this information to [place].    If any biopsies were taken you will be contacted by phone or by letter within the next 1-3 weeks.  Please call us at [phone] if you have not heard about the biopsies in 3 weeks.    SIGNATURES/CONFIDENTIALITY: You and/or your care partner have signed paperwork which will be entered into your electronic medical record.  These signatures attest to the fact that that the information above on your After Visit Summary has been reviewed and is understood.  Full responsibility of the confidentiality of this discharge information lies with you and/or your care-partner.   Resume medications. Information given on polyps. Pt. Instructed by doctor Armbruster to take aspirin when  she arrives home.

## 2019-10-04 NOTE — Progress Notes (Signed)
Careteam: Patient Care Team: Lauree Chandler, NP as PCP - General (Geriatric Medicine)  PLACE OF SERVICE:  [facility] information    No Known Allergies  Chief Complaint  Patient presents with  . Acute Visit    Afib, patient with EKG from GI doctor, patient needs referral to Cardiology (ASAP), Here with sister Tamela Oddi   . Head pain    Patient c/o throbbing feeling between left eye and scalp, onset 4 months ago. Patient has a pending appointment with eye doctor on Friday.      HPI: Patient is a 74 y.o. female for acute visit. GI called and reports she went into a fib while she was having her colonoscopy. Pt was instructed to go to the ED but requested not to go and was directed to PCP.  Her blood pressure has been stable and she has had rate control. Overall she feels well without chest pains, palpitations or swelling. No dizziness or headaches. Pt does report history of feeling of palpitations in her chest. She reports she has had this feeling for years and went to Dr in [place] but at the time they never found anything and heart was normal.  Not feeling of palpitation but "stress in chest" in the past No bleeding disorders  Colonoscopy done today with 11 polyps removed with cold snare.   Review of Systems:  Review of Systems  Constitutional: Negative for chills, fever and weight loss.  Respiratory: Negative for cough, sputum production and shortness of breath.   Cardiovascular: Negative for chest pain, palpitations, orthopnea, claudication and leg swelling.  Gastrointestinal: Negative for abdominal pain, constipation, diarrhea and heartburn.  Musculoskeletal: Negative for back pain, joint pain and myalgias.  Skin: Negative.   Neurological: Negative for dizziness and headaches.    Past Medical History:  Diagnosis Date  . Arthritis    self report  . Bone fracture    Right Foot  . H/O hernia repair   . History of hysterectomy   . History of  mammogram    Dr.Elizabeth Gwenlyn Saran  . History of MRI    Dr.Elizabeth Gwenlyn Saran.  Marland Kitchen History of removal of skin mole 03/09/1997  . Hyperlipidemia   . Hypertension   . Scalp hematoma   . Wears glasses    Past Surgical History:  Procedure Laterality Date  . ABDOMINAL HYSTERECTOMY    . BREAST BIOPSY  03/09/1997   Dr.Finder  . CHOLECYSTECTOMY     ~ 6 yrs ago  . COLONOSCOPY  2009   Dr.Chesley  . CYSTECTOMY  09/04/2019   Dr.Gray  . GALLBLADDER SURGERY  03/10/2015   Dr.Amid [facility] )  . POLYPECTOMY     12 polyps removed at [facility]  2016   Social History:   reports that she has never smoked. She has never used smokeless tobacco. She reports current alcohol use of about 1.0 standard drink of alcohol per week. She reports that she does not use drugs.  Family History  Problem Relation Age of Onset  . Breast cancer Mother   . Alzheimer's disease Mother   . Dementia Mother   . Parkinson's disease Mother   . Colon polyps Mother   . Stroke Father   . Dementia Father   . Breast cancer Sister   . Allergies Daughter   . Allergies Son   . Breast cancer Maternal Aunt   . Allergies Daughter   . Colon cancer Neg Hx   .  Esophageal cancer Neg Hx   . Stomach cancer Neg Hx   . Rectal cancer Neg Hx     Medications: Patient's Medications  New Prescriptions   No medications on file  Previous Medications   AMLODIPINE (NORVASC) 5 MG TABLET    Take 5 mg by mouth at bedtime.   BLACK ELDERBERRY PO    Take 1,000 mg by mouth daily.   CHOLECALCIFEROL (VITAMIN D3) 125 MCG (5000 UT) TABS    Take by mouth daily.   METOPROLOL SUCCINATE (TOPROL-XL) 25 MG 24 HR TABLET    Take 25 mg by mouth daily.   MULTIPLE VITAMINS-MINERALS (COMPLETE MULTIVITAMIN/MINERAL PO)    Take by mouth daily.   OMEGA-3 KRILL OIL PO    Take 500 mg by mouth daily.   ROSUVASTATIN (CRESTOR) 5 MG TABLET    Take 5 mg by mouth at bedtime.   VALSARTAN-HYDROCHLOROTHIAZIDE (DIOVAN-HCT) 160-12.5 MG TABLET    Take  1 tablet by mouth daily.   ZINC OXIDE PO    Take 22 mg by mouth daily.  Modified Medications   No medications on file  Discontinued Medications   No medications on file    Physical Exam:  Vitals:   10/04/19 1534  BP: 128/76  Pulse: 95  Temp: (!) 96.8 F (36 C)  TempSrc: Temporal  SpO2: 98%  Weight: (!) 272 lb (123.4 kg)  Height: 5\' 7"  (1.702 m)   Body mass index is 42.6 kg/m. Wt Readings from Last 3 Encounters:  10/04/19 (!) 272 lb (123.4 kg)  10/04/19 (!) 274 lb (124.3 kg)  09/20/19 274 lb 9.6 oz (124.6 kg)    Physical Exam Constitutional:      Appearance: Normal appearance.  HENT:     Head: Normocephalic and atraumatic.     Mouth/Throat:     Mouth: Mucous membranes are moist.     Pharynx: Oropharynx is clear.  Eyes:     Pupils: Pupils are equal, round, and reactive to light.  Cardiovascular:     Rate and Rhythm: Normal rate. Rhythm irregular.     Pulses: Normal pulses.  Pulmonary:     Effort: Pulmonary effort is normal. No respiratory distress.     Breath sounds: Normal breath sounds.  Abdominal:     General: Abdomen is flat.     Palpations: Abdomen is soft.  Musculoskeletal:     Cervical back: Normal range of motion and neck supple.  Neurological:     General: No focal deficit present.     Mental Status: She is alert and oriented to person, place, and time.  Psychiatric:        Mood and Affect: Mood normal.        Behavior: Behavior normal.     Labs reviewed: Basic Metabolic Panel: No results for input(s): NA, K, CL, CO2, GLUCOSE, BUN, CREATININE, CALCIUM, MG, PHOS, TSH in the last 8760 hours. Liver Function Tests: No results for input(s): AST, ALT, ALKPHOS, BILITOT, PROT, ALBUMIN in the last 8760 hours. No results for input(s): LIPASE, AMYLASE in the last 8760 hours. No results for input(s): AMMONIA in the last 8760 hours. CBC: No results for input(s): WBC, NEUTROABS, HGB, HCT, MCV, PLT in the last 8760 hours. Lipid Panel: No results for  input(s): CHOL, HDL, LDLCALC, TRIG, CHOLHDL, LDLDIRECT in the last 8760 hours. TSH: No results for input(s): TSH in the last 8760 hours. A1C: No results found for: HGBA1C   Assessment/Plan 1. New onset atrial fibrillation ([place]) -noted today during colonoscopy, rate has  been controlled.  CHA2DS2-VASc score of 3 based on age, gender and hypertension.  - EKG 12-Lead confirmed a fib, rate 82 - Ambulatory referral to Cardiology- appt made and given to pt - BASIC METABOLIC PANEL WITH GFR - CBC with Differential/Platelet - TSH - T3, Free - T4, Free - metoprolol succinate (TOPROL-XL) 50 MG 24 hr tablet; Take 1 tablet (50 mg total) by mouth daily.  Dispense: 30 tablet; Refill: 1 - Magnesium  2. Snores Pt with morbid obesity and snores, OSA needs to be evaluated as well. Will discuss this with cardiology (if needed can refer to pulmonary for further evaluation)   3. Morbid obesity ([place]) Discuss the need for weight loss, discussed healthy eating habits with exercise however will await for further evaluation for a fib before any new exercise programs.   Total time 50 mins:  time greater than 50% of total time spent doing pt counseling and coordination of care due to new onset a fib Consulted with Dr Linna Darner due to new onset a fib, in agreement with plan Next appt: 2 months. Sooner if needed  [employer]. [place], [facility] [phone]

## 2019-10-04 NOTE — Progress Notes (Signed)
Pt. Arrived into recovery in A-fib,verbal order received from Dr. Havery Moros for 12 lead EKG,Which was completed in recovery which verified A-fib. Sister was called back to recovery and Dr. Havery Moros spoke with her and patient extensively about heart status. Primary Dr. Hulen Skains spoke with Dani Gobble and informed her of pt. Current status to see if they were able to see patient today and she instructed me to tell patient to be at her office by 1530 today,and pt. And sister stated "ok". Copy of the 12 lead EKG sent with pt. To take to primary care. Pt. Was alert,oriented and verbally responsive,skin warm and dry and V/S stable upon discharge.

## 2019-10-04 NOTE — Progress Notes (Signed)
pt tolerated well. VSS. awake and to recovery. Report given to RN.  

## 2019-10-04 NOTE — Progress Notes (Signed)
Pt's states no medical or surgical changes since previsit or office visit.  Vitals- [name] 

## 2019-10-05 ENCOUNTER — Other Ambulatory Visit: Payer: Self-pay | Admitting: Nurse Practitioner

## 2019-10-05 DIAGNOSIS — I4891 Unspecified atrial fibrillation: Secondary | ICD-10-CM

## 2019-10-05 LAB — CBC WITH DIFFERENTIAL/PLATELET
Absolute Monocytes: 469 cells/uL (ref 200–950)
Basophils Absolute: 27 cells/uL (ref 0–200)
Basophils Relative: 0.4 %
Eosinophils Absolute: 41 cells/uL (ref 15–500)
Eosinophils Relative: 0.6 %
HCT: 40.6 % (ref 35.0–45.0)
Hemoglobin: 12.7 g/dL (ref 11.7–15.5)
Lymphs Abs: 1931 cells/uL (ref 850–3900)
MCH: 26.3 pg — ABNORMAL LOW (ref 27.0–33.0)
MCHC: 31.3 g/dL — ABNORMAL LOW (ref 32.0–36.0)
MCV: 84.1 fL (ref 80.0–100.0)
MPV: 11.3 fL (ref 7.5–12.5)
Monocytes Relative: 6.9 %
Neutro Abs: 4332 cells/uL (ref 1500–7800)
Neutrophils Relative %: 63.7 %
Platelets: 211 10*3/uL (ref 140–400)
RBC: 4.83 10*6/uL (ref 3.80–5.10)
RDW: 14.2 % (ref 11.0–15.0)
Total Lymphocyte: 28.4 %
WBC: 6.8 10*3/uL (ref 3.8–10.8)

## 2019-10-05 LAB — BASIC METABOLIC PANEL WITH GFR
BUN/Creatinine Ratio: 10 (calc) (ref 6–22)
BUN: 9 mg/dL (ref 7–25)
CO2: 28 mmol/L (ref 20–32)
Calcium: 9.1 mg/dL (ref 8.6–10.4)
Chloride: 105 mmol/L (ref 98–110)
Creat: 0.94 mg/dL — ABNORMAL HIGH (ref 0.60–0.93)
GFR, Est African American: 69 mL/min/{1.73_m2} (ref >=60)
GFR, Est Non African American: 60 mL/min/{1.73_m2} (ref >=60)
Glucose, Bld: 91 mg/dL (ref 65–139)
Potassium: 3.7 mmol/L (ref 3.5–5.3)
Sodium: 142 mmol/L (ref 135–146)

## 2019-10-05 LAB — MAGNESIUM: Magnesium: 1.9 mg/dL (ref 1.5–2.5)

## 2019-10-05 LAB — T3, FREE: T3, Free: 3.1 pg/mL (ref 2.3–4.2)

## 2019-10-05 LAB — T4, FREE: Free T4: 1 ng/dL (ref 0.8–1.8)

## 2019-10-05 LAB — TSH: TSH: 0.95 mIU/L (ref 0.40–4.50)

## 2019-10-05 MED ORDER — APIXABAN 5 MG PO TABS: 5.0000 mg | ORAL_TABLET | Freq: Two times a day (BID) | ORAL | 2 refills | Status: DC

## 2019-10-05 NOTE — Progress Notes (Signed)
el

## 2019-10-06 ENCOUNTER — Encounter: Payer: Self-pay | Admitting: Nurse Practitioner

## 2019-10-06 ENCOUNTER — Telehealth: Payer: Self-pay | Admitting: *Deleted

## 2019-10-06 NOTE — Telephone Encounter (Signed)
1. Have you developed a fever since your procedure? no  2.   Have you had an respiratory symptoms (SOB or cough) since your procedure? no  3.   Have you tested positive for COVID 19 since your procedure no  4.   Have you had any family members/close contacts diagnosed with the COVID 19 since your procedure?  no   If yes to any of these questions please route to Joylene John, RN and Erenest Rasher, RN Follow up Call-  Call back number 10/04/2019  Post procedure Call Back phone  # [phone]  Permission to leave phone message Yes  Some recent data might be hidden     Patient questions:  Do you have a fever, pain , or abdominal swelling? No. Pain Score  0 *  Have you tolerated food without any problems? Yes.    Have you been able to return to your normal activities? Yes.    Do you have any questions about your discharge instructions: Diet   No. Medications  No. Follow up visit  No.  Do you have questions or concerns about your Care? No.  Actions: * If pain score is 4 or above: No action needed, pain <4.

## 2019-10-10 ENCOUNTER — Encounter: Payer: Self-pay | Admitting: Gastroenterology

## 2019-10-10 ENCOUNTER — Other Ambulatory Visit: Payer: Self-pay

## 2019-10-10 ENCOUNTER — Ambulatory Visit: Payer: Medicare Other | Admitting: Cardiology

## 2019-10-10 ENCOUNTER — Encounter: Payer: Self-pay | Admitting: Cardiology

## 2019-10-10 VITALS — BP 116/80 | HR 77 | Resp 16 | Ht 67.0 in | Wt 274.0 lb

## 2019-10-10 DIAGNOSIS — R0609 Other forms of dyspnea: Secondary | ICD-10-CM

## 2019-10-10 DIAGNOSIS — Z20822 Contact with and (suspected) exposure to covid-19: Secondary | ICD-10-CM

## 2019-10-10 DIAGNOSIS — I10 Essential (primary) hypertension: Secondary | ICD-10-CM

## 2019-10-10 DIAGNOSIS — I4819 Other persistent atrial fibrillation: Secondary | ICD-10-CM

## 2019-10-10 DIAGNOSIS — E7800 Pure hypercholesterolemia, unspecified: Secondary | ICD-10-CM

## 2019-10-10 DIAGNOSIS — Z6841 Body Mass Index (BMI) 40.0 and over, adult: Secondary | ICD-10-CM

## 2019-10-10 DIAGNOSIS — R0600 Dyspnea, unspecified: Secondary | ICD-10-CM

## 2019-10-10 MED ORDER — DILTIAZEM HCL ER COATED BEADS 180 MG PO CP24: 180.0000 mg | ORAL_CAPSULE | Freq: Every day | ORAL | 2 refills | Status: DC

## 2019-10-10 NOTE — Progress Notes (Signed)
Primary Physician/Referring:  Lauree Chandler, NP  Patient ID: Terry Koch, female    DOB: 12/23/1945, 74 y.o.   MRN: [id number]  Chief Complaint  Patient presents with  . Atrial Fibrillation    New Onset  . New Patient (Initial Visit)    Referred by Sherrie Mustache   HPI:    Shalice Woodring  is a 74 y.o. African-American female with hypertension, hyperlipidemia, obesity, colonic polyps requiring serial surveillance colonoscopy, no prior history of GI bleed referred to me for evaluation of new onset atrial fibrillation noted during colonoscopy.  Except forshortness of breath and dyspnea on exertion and palpitations no other specific symptoms today and patient thinks she may be slightly more tired than usual.  Past Medical History:  Diagnosis Date  . Arthritis    self report  . Bone fracture    Right Foot  . H/O hernia repair   . History of hysterectomy   . History of mammogram    Dr.Elizabeth Gwenlyn Saran  . History of MRI    Dr.Elizabeth Gwenlyn Saran.  Marland Kitchen History of removal of skin mole 03/09/1997  . Hyperlipidemia   . Hypertension   . Scalp hematoma   . Wears glasses    Past Surgical History:  Procedure Laterality Date  . ABDOMINAL HYSTERECTOMY    . BREAST BIOPSY  03/09/1997   Dr.Finder  . CHOLECYSTECTOMY     ~ 6 yrs ago  . COLONOSCOPY  2009   Dr.Chesley  . CYSTECTOMY  09/04/2019   Dr.Gray  . GALLBLADDER SURGERY  03/10/2015   Dr.Amid [facility] )  . POLYPECTOMY     12 polyps removed at [facility]  2016   Family History  Problem Relation Age of Onset  . Breast cancer Mother   . Alzheimer's disease Mother   . Dementia Mother   . Parkinson's disease Mother   . Colon polyps Mother   . Stroke Father   . Dementia Father   . Breast cancer Sister   . Allergies Daughter   . Allergies Son   . Breast cancer Maternal Aunt   . Allergies Daughter   . Colon cancer Neg Hx   . Esophageal cancer Neg Hx   . Stomach cancer Neg Hx   . Rectal  cancer Neg Hx     Social History   Tobacco Use  . Smoking status: Never Smoker  . Smokeless tobacco: Never Used  Substance Use Topics  . Alcohol use: Yes    Alcohol/week: 1.0 standard drink    Types: 1 Glasses of wine per week   Marital Status: Married  ROS  Review of Systems  Cardiovascular: Positive for dyspnea on exertion. Negative for chest pain and leg swelling.  Gastrointestinal: Negative for melena.   Objective  Blood pressure 116/80, pulse 77, resp. rate 16, height 5\' 7"  (1.702 m), weight 274 lb (124.3 kg), SpO2 97 %.  Vitals with BMI 10/10/2019 10/04/2019 10/04/2019  Height 5\' 7"  5\' 7"  -  Weight 274 lbs 272 lbs -  BMI 94.1 74.08 -  Systolic 144 818 563  Diastolic 80 76 85  Pulse 77 95 78     Physical Exam Constitutional:      Comments: Morbidly obese in no acute distress.  Cardiovascular:     Rate and Rhythm: Normal rate. Rhythm irregular.     Pulses:          Carotid pulses are 2+ on the right side and 2+ on the left  side.      Dorsalis pedis pulses are 2+ on the right side and 2+ on the left side.       Posterior tibial pulses are 2+ on the right side and 2+ on the left side.     Heart sounds: Normal heart sounds. No murmur heard.  No gallop.      Comments: Femoral and popliteal pulse difficult to feel due to patient's body habitus.  No leg edema. JVD difficult to see due to short neck. Pulmonary:     Effort: Pulmonary effort is normal.     Breath sounds: Normal breath sounds.  Abdominal:     General: Bowel sounds are normal.     Palpations: Abdomen is soft.     Comments: Obese. Pannus present    Laboratory examination:   Recent Labs    10/04/19 1617  NA 142  K 3.7  CL 105  CO2 28  GLUCOSE 91  BUN 9  CREATININE 0.94*  CALCIUM 9.1  GFRNONAA 60  GFRAA 69   estimated creatinine clearance is 71.9 mL/min (A) (by C-G formula based on SCr of 0.94 mg/dL (H)).  CMP Latest Ref Rng & Units 10/04/2019  Glucose 65 - 139 mg/dL 91  BUN 7 - 25 mg/dL 9    Creatinine 0.60 - 0.93 mg/dL 0.94(H)  Sodium 135 - 146 mmol/L 142  Potassium 3.5 - 5.3 mmol/L 3.7  Chloride 98 - 110 mmol/L 105  CO2 20 - 32 mmol/L 28  Calcium 8.6 - 10.4 mg/dL 9.1   CBC Latest Ref Rng & Units 10/04/2019  WBC 3.8 - 10.8 Thousand/uL 6.8  Hemoglobin 11.7 - 15.5 g/dL 12.7  Hematocrit 35 - 45 % 40.6  Platelets 140 - 400 Thousand/uL 211    Lipid Panel No results for input(s): CHOL, TRIG, LDLCALC, VLDL, HDL, CHOLHDL, LDLDIRECT in the last 8760 hours.  HEMOGLOBIN A1C No results found for: HGBA1C, MPG TSH Recent Labs    10/04/19 1617  TSH 0.95   Medications and allergies  No Known Allergies   Outpatient Medications Prior to Visit  Medication Sig Dispense Refill  . apixaban (ELIQUIS) 5 MG TABS tablet Take 1 tablet (5 mg total) by mouth 2 (two) times daily. 60 tablet 2  . BLACK ELDERBERRY PO Take 1,000 mg by mouth daily.    . Cholecalciferol (VITAMIN D3) 125 MCG (5000 UT) TABS Take by mouth daily.    . metoprolol succinate (TOPROL-XL) 50 MG 24 hr tablet TAKE 1 TABLET(50 MG) BY MOUTH DAILY 90 tablet 0  . Multiple Vitamins-Minerals (COMPLETE MULTIVITAMIN/MINERAL PO) Take by mouth daily.    . OMEGA-3 KRILL OIL PO Take 500 mg by mouth daily.    . rosuvastatin (CRESTOR) 5 MG tablet Take 5 mg by mouth at bedtime.    . valsartan-hydrochlorothiazide (DIOVAN-HCT) 160-12.5 MG tablet Take 1 tablet by mouth daily.    Marland Kitchen ZINC OXIDE PO Take 22 mg by mouth daily.    Marland Kitchen amLODipine (NORVASC) 5 MG tablet Take 5 mg by mouth at bedtime.     No facility-administered medications prior to visit.     Radiology:   No results found.  Cardiac Studies:   None  EKG  EKG 10/10/2019: Atrial fibrillation with controlled ventricular response at the rate of 83 bpm, left axis deviation, left anterior fascicular block.  Poor R progression, cannot exclude anterolateral infarct old.  LVH.  Nonspecific T abnormality.      Assessment     ICD-10-CM   1. Persistent atrial fibrillation (  HCC)   I48.19 EKG 12-Lead    PCV ECHOCARDIOGRAM COMPLETE    PCV MYOCARDIAL PERFUSION WO LEXISCAN    diltiazem (CARDIZEM CD) 180 MG 24 hr capsule  2. Dyspnea on exertion  R06.00 PCV ECHOCARDIOGRAM COMPLETE    PCV MYOCARDIAL PERFUSION WO LEXISCAN  3. Primary hypertension  I10 diltiazem (CARDIZEM CD) 180 MG 24 hr capsule  4. Hypercholesteremia  E78.00   5. Class 3 severe obesity due to excess calories with serious comorbidity and body mass index (BMI) of 40.0 to 44.9 in adult (HCC)  E66.01    Z68.41      CHA2DS2-VASc Score is 3.  Yearly risk of stroke: 3.2% (A, F, HTN).  Score of 1=0.6; 2=2.2; 3=3.2; 4=4.8; 5=7.2; 6=9.8; 7=>9.8) -(CHF; HTN; vasc disease DM,  Female = 1; Age <65 =0; 65-74 = 1,  >75 =2; stroke/embolism= 2).  Medications Discontinued During This Encounter  Medication Reason  . amLODipine (NORVASC) 5 MG tablet Change in therapy    Meds ordered this encounter  Medications  . diltiazem (CARDIZEM CD) 180 MG 24 hr capsule    Sig: Take 1 capsule (180 mg total) by mouth daily.    Dispense:  30 capsule    Refill:  2    Discontinue Amlodipine    Recommendations:   Mechelle Fauth is a 74 y.o. African-American female with hypertension, hyperlipidemia, obesity, colonic polyps requiring serial surveillance colonoscopy, no prior history of GI bleed referred to me for evaluation of new onset atrial fibrillation noted during colonoscopy.  Except for shortness of breath and dyspnea on exertion, palpitations, no other specific symptoms today and patient thinks she may be slightly more tired than usual.  Although morbidly obese and is presently 74 years of age, fairly active person with essentially normal exam except for underlying A. fib, and obesity.  Her recent shortness of breath and fatigue may be related to new onset atrial fibrillation.  I will discontinue amlodipine and switch her to diltiazem CD for better rate control as she does feel palpitations.  However I need to be careful as  patient is also on metoprolol and due to her age do not want to cause significant heart block or marked bradycardia.  I will repeat EKG on her next office visit.  She was started on Eliquis on 10/05/2019, will consider cardioversion after she has been adequately anticoagulated for at least 3 to 4 weeks.  We may have to consider a sleep study as well however she denies any symptoms suggestive of sleep apnea.  To evaluate her dyspnea and new onset A. fib, I have recommended an echocardiogram and also exercise nuclear stress test on a 2-day protocol.  Continue Eliquis in view of cardioembolic risk, her risk will further increase next year as she done 74 years of age.  Although she has colonic polyps, no history of GI bleed.   Adrian Prows, MD, [facility] 10/11/2019, 8:06 AM Office: [phone]

## 2019-10-11 LAB — SARS-COV-2, NAA 2 DAY TAT

## 2019-10-11 LAB — NOVEL CORONAVIRUS, NAA: SARS-CoV-2, NAA: NOT DETECTED

## 2019-10-18 ENCOUNTER — Other Ambulatory Visit: Payer: Self-pay

## 2019-10-18 ENCOUNTER — Ambulatory Visit: Payer: Medicare Other

## 2019-10-18 DIAGNOSIS — R0609 Other forms of dyspnea: Secondary | ICD-10-CM

## 2019-10-18 DIAGNOSIS — I4819 Other persistent atrial fibrillation: Secondary | ICD-10-CM

## 2019-10-18 DIAGNOSIS — R0600 Dyspnea, unspecified: Secondary | ICD-10-CM

## 2019-10-23 ENCOUNTER — Other Ambulatory Visit: Payer: Medicare Other

## 2019-10-25 ENCOUNTER — Ambulatory Visit: Payer: Medicare Other

## 2019-10-25 ENCOUNTER — Other Ambulatory Visit: Payer: Self-pay

## 2019-10-25 ENCOUNTER — Ambulatory Visit: Payer: Medicare Other | Admitting: Cardiology

## 2019-10-25 DIAGNOSIS — R0609 Other forms of dyspnea: Secondary | ICD-10-CM

## 2019-10-25 DIAGNOSIS — R0600 Dyspnea, unspecified: Secondary | ICD-10-CM

## 2019-10-25 DIAGNOSIS — I4819 Other persistent atrial fibrillation: Secondary | ICD-10-CM

## 2019-11-03 ENCOUNTER — Telehealth: Payer: Self-pay

## 2019-11-03 NOTE — Telephone Encounter (Signed)
Patient dropped off a 3 page letter for Lauree Chandler, NP to review and advise on multiple concerns.   Below is a summary of concerns:  1.) Unrest in chest area, per Lauree Chandler, NP patient needs to follow-up with Dr.Gangi  2.) Numbness in right thigh and right hand- per Lauree Chandler, NP need in office visit  3.) Questions if medications could be the cause of unrest in chest area. Per Lauree Chandler, NP to be evaluated by [facility] and/or reviewed at in office visit here.  4.) Questions if bleeding is a possibility from scheduled breast biopsy pending for Tuesday 11/07/2019. Per Lauree Chandler, NP anytime there is a disruption in the skin bleeding is a possibility  I called patient and advised according to responses from Lauree Chandler, NP   Patient scheduled an in office visit with Marlowe Sax, NP on Tuesday @ 2:15 pm. 3 page letter from patient placed in Dinah's review and sign folder.  Message will be routed to Lauree Chandler, NP to confirm that I did not misquote her and to [place] as a FYI.

## 2019-11-05 NOTE — Telephone Encounter (Signed)
If having any chest pain will need evaluation in the urgent care or ED but if not will see her as scheduled.

## 2019-11-06 NOTE — Telephone Encounter (Signed)
Patient was informed to see her cardiologist regarding chest related issues as previously stated in original message per Lauree Chandler, NP.

## 2019-11-06 NOTE — Telephone Encounter (Signed)
Left message on voicemail for patient to return call when available    Reason for call: Inform patient of Dinah's response to message.   I also sent patient a mychart message with Dinah's response and asked that she return call if she has any questions

## 2019-11-07 ENCOUNTER — Ambulatory Visit: Payer: Medicare Other | Admitting: Family

## 2019-11-07 ENCOUNTER — Encounter: Payer: Self-pay | Admitting: Family

## 2019-11-07 ENCOUNTER — Ambulatory Visit
Admission: RE | Admit: 2019-11-07 | Discharge: 2019-11-07 | Disposition: A | Discharge status: Home / Self Care | Payer: Medicare Other | Source: Ambulatory Visit | Attending: Nurse Practitioner | Admitting: Nurse Practitioner

## 2019-11-07 ENCOUNTER — Other Ambulatory Visit: Payer: Self-pay

## 2019-11-07 ENCOUNTER — Ambulatory Visit (INDEPENDENT_AMBULATORY_CARE_PROVIDER_SITE_OTHER): Payer: Medicare Other | Admitting: Family

## 2019-11-07 VITALS — BP 136/82 | HR 77 | Temp 96.9°F | Resp 18 | Ht 67.0 in | Wt 277.0 lb

## 2019-11-07 DIAGNOSIS — R921 Mammographic calcification found on diagnostic imaging of breast: Secondary | ICD-10-CM

## 2019-11-07 DIAGNOSIS — R0789 Other chest pain: Secondary | ICD-10-CM | POA: Diagnosis not present

## 2019-11-07 DIAGNOSIS — I4891 Unspecified atrial fibrillation: Secondary | ICD-10-CM | POA: Diagnosis not present

## 2019-11-07 HISTORY — PX: BREAST BIOPSY: SHX20

## 2019-11-07 NOTE — Patient Instructions (Signed)
-   check blood pressure and Heart daily prior to taking diltiazem and Metoprolol Hold if B/p is less than 90/60 or Heart rate less than 60 b/min   - follow up with Cardiologist as scheduled  - Call provider's office or go to ED if symptoms worsen

## 2019-11-07 NOTE — Progress Notes (Signed)
Provider: Dinah Ngetich FNP-C  Lauree Chandler, NP  Patient Care Team: Lauree Chandler, NP as PCP - General (Geriatric Medicine)  Extended Emergency Contact Information Primary Emergency Contact: [place] Phone: [phone] Relation: Spouse Secondary Emergency Contact: [place] Phone: [phone] Relation: Daughter  Code Status:  Full Code  Goals of care: Advanced Directive information Advanced Directives 11/07/2019  Does Patient Have a Medical Advance Directive? Yes  Type of Advance Directive -  Does patient want to make changes to medical advance directive? No - Patient declined     Chief Complaint  Patient presents with  . Acute Visit    Numbness in various areas/ Right thigh and Right hand x one/off 10+years. & Chest tightness.    HPI:  Pt is a 74 y.o. female seen today for an acute visit for evaluation of numbness in various areas/ Right thigh and Right hand  Has been on/off 10+years.No weakness noted. Also complains of chest pressure which is worst at night when she lies down.Pressure gets better as the hours goee.she is scared to sleep. No cough,or wheezing reported.States gets shortnes of breath even with talking.    Past Medical History:  Diagnosis Date  . Arthritis    self report  . Bone fracture    Right Foot  . H/O hernia repair   . History of hysterectomy   . History of mammogram    Dr.Elizabeth Gwenlyn Saran  . History of MRI    Dr.Elizabeth Gwenlyn Saran.  Marland Kitchen History of removal of skin mole 03/09/1997  . Hyperlipidemia   . Hypertension   . Scalp hematoma   . Wears glasses    Past Surgical History:  Procedure Laterality Date  . ABDOMINAL HYSTERECTOMY    . BREAST BIOPSY  03/09/1997   Dr.Finder  . CHOLECYSTECTOMY     ~ 6 yrs ago  . COLONOSCOPY  2009   Dr.Chesley  . CYSTECTOMY  09/04/2019   Dr.Gray  . GALLBLADDER SURGERY  03/10/2015   Dr.Amid [facility] )  . POLYPECTOMY     12 polyps removed at [place]  2016    No Known Allergies  Outpatient Encounter Medications as of 11/07/2019  Medication Sig  . apixaban (ELIQUIS) 5 MG TABS tablet Take 1 tablet (5 mg total) by mouth 2 (two) times daily.  . Aspirin 81 MG CAPS Take 1 capsule by mouth daily.  Marland Kitchen BLACK ELDERBERRY PO Take 1,000 mg by mouth daily.  . Cholecalciferol (VITAMIN D3) 125 MCG (5000 UT) TABS Take by mouth daily.  Marland Kitchen diltiazem (CARDIZEM CD) 180 MG 24 hr capsule Take 1 capsule (180 mg total) by mouth daily.  . metoprolol succinate (TOPROL-XL) 50 MG 24 hr tablet TAKE 1 TABLET(50 MG) BY MOUTH DAILY  . Multiple Vitamins-Minerals (COMPLETE MULTIVITAMIN/MINERAL PO) Take by mouth daily.  . OMEGA-3 KRILL OIL PO Take 500 mg by mouth daily.  . rosuvastatin (CRESTOR) 5 MG tablet Take 5 mg by mouth at bedtime.  . valsartan-hydrochlorothiazide (DIOVAN-HCT) 160-12.5 MG tablet Take 1 tablet by mouth daily.  Marland Kitchen ZINC OXIDE PO Take 22 mg by mouth daily.   No facility-administered encounter medications on file as of 11/07/2019.    Review of Systems  Constitutional: Positive for fatigue. Negative for appetite change, chills and fever.  HENT: Negative for congestion, rhinorrhea, sinus pressure, sinus pain, sneezing and sore throat.   Respiratory: Negative for cough, chest tightness and wheezing.        Shortness of breath when talking  Cardiovascular: Negative for palpitations and leg swelling.  Neurological: Negative for dizziness, speech difficulty, light-headedness and headaches.    Immunization History  Administered Date(s) Administered  . PFIZER SARS-COV-2 Vaccination 03/31/2019, 04/21/2019   Pertinent  Health Maintenance Due  Topic Date Due  . PNA vac Low Risk Adult (1 of 2 - PCV13) Never done  . INFLUENZA VACCINE  10/08/2019  . MAMMOGRAM  09/25/2021  . COLONOSCOPY  10/04/2022  . DEXA SCAN  Completed   Fall Risk  11/07/2019 09/01/2019 07/17/2019 07/07/2019  Falls in the past year? 0 0 0 0  Number falls in past yr: 0 0 0 0   Injury with Fall? 0 0 0 0    Vitals:   11/07/19 1335  BP: 136/82  Pulse: 77  Resp: 18  Temp: (!) 96.9 F (36.1 C)  SpO2: 97%  Weight: 277 lb (125.6 kg)  Height: 5\' 7"  (1.702 m)   Body mass index is 43.38 kg/m. Physical Exam Constitutional:      General: She is not in acute distress.    Appearance: She is not ill-appearing.  HENT:     Head: Normocephalic.     Nose: Nose normal. No congestion or rhinorrhea.     Mouth/Throat:     Mouth: Mucous membranes are moist.     Pharynx: Oropharynx is clear. No oropharyngeal exudate or posterior oropharyngeal erythema.  Eyes:     General: No scleral icterus.       Right eye: No discharge.        Left eye: No discharge.     Conjunctiva/sclera: Conjunctivae normal.     Pupils: Pupils are equal, round, and reactive to light.  Cardiovascular:     Rate and Rhythm: Normal rate. Rhythm irregular.     Pulses: Normal pulses.     Heart sounds: Normal heart sounds. No murmur heard.  No friction rub. No gallop.   Pulmonary:     Effort: Pulmonary effort is normal. No respiratory distress.     Breath sounds: Normal breath sounds. No wheezing, rhonchi or rales.  Chest:     Chest wall: No tenderness.  Abdominal:     General: Bowel sounds are normal. There is no distension.     Palpations: Abdomen is soft. There is no mass.     Tenderness: There is no abdominal tenderness. There is no right CVA tenderness, left CVA tenderness, guarding or rebound.  Musculoskeletal:        General: No swelling or tenderness.     Right lower leg: No edema.     Left lower leg: No edema.  Skin:    General: Skin is warm.     Coloration: Skin is not jaundiced or pale.     Findings: No bruising, erythema or rash.  Neurological:     Mental Status: She is alert and oriented to person, place, and time.     Cranial Nerves: No cranial nerve deficit.     Motor: No weakness.  Psychiatric:        Mood and Affect: Mood normal.        Behavior: Behavior normal.         Thought Content: Thought content normal.        Judgment: Judgment normal.    Labs reviewed: Recent Labs    10/04/19 1617  NA 142  K 3.7  CL 105  CO2 28  GLUCOSE 91  BUN 9  CREATININE 0.94*  CALCIUM 9.1  MG 1.9    Recent  Labs    10/04/19 1617  WBC 6.8  NEUTROABS 4,332  HGB 12.7  HCT 40.6  MCV 84.1  PLT 211   Lab Results  Component Value Date   TSH 0.95 10/04/2019    Significant Diagnostic Results in last 30 days:  MM CLIP PLACEMENT LEFT  Result Date: 11/07/2019 CLINICAL DATA:  74 year old female presenting for biopsy of bilateral breast calcifications. EXAM: DIAGNOSTIC BILATERAL MAMMOGRAM POST STEREOTACTIC BIOPSY COMPARISON:  Previous exam(s). FINDINGS: Mammographic images were obtained following stereotactic guided biopsy of calcifications in the upper inner right breast. The coil biopsy marking clip is in expected position at the site of biopsy. Mammographic images were obtained following stereotactic guided biopsy of calcifications in the medial left breast. The coil biopsy marking clip is in expected position at the site of biopsy. IMPRESSION: Appropriate positioning of the coil shaped biopsy marking clip at the site of biopsy in the upper inner right breast. Appropriate positioning of the coil shaped biopsy marking clip at the site of biopsy in the medial left breast. Final Assessment: Post Procedure Mammograms for Marker Placement Electronically Signed   By: Audie Pinto M.D.   On: 11/07/2019 11:31   MM CLIP PLACEMENT RIGHT  Result Date: 11/07/2019 CLINICAL DATA:  74 year old female presenting for biopsy of bilateral breast calcifications. EXAM: DIAGNOSTIC BILATERAL MAMMOGRAM POST STEREOTACTIC BIOPSY COMPARISON:  Previous exam(s). FINDINGS: Mammographic images were obtained following stereotactic guided biopsy of calcifications in the upper inner right breast. The coil biopsy marking clip is in expected position at the site of biopsy. Mammographic images were  obtained following stereotactic guided biopsy of calcifications in the medial left breast. The coil biopsy marking clip is in expected position at the site of biopsy. IMPRESSION: Appropriate positioning of the coil shaped biopsy marking clip at the site of biopsy in the upper inner right breast. Appropriate positioning of the coil shaped biopsy marking clip at the site of biopsy in the medial left breast. Final Assessment: Post Procedure Mammograms for Marker Placement Electronically Signed   By: Audie Pinto M.D.   On: 11/07/2019 11:31   MM LT BREAST BX W LOC DEV 1ST LESION IMAGE BX SPEC STEREO GUIDE  Result Date: 11/07/2019 CLINICAL DATA:  74 year old female presenting for biopsy of bilateral breast calcifications. EXAM: RIGHT BREAST STEREOTACTIC CORE NEEDLE BIOPSY LEFT BREAST STEREOTACTIC CORE NEEDLE BIOPSY COMPARISON:  Previous exams. FINDINGS: The patient and I discussed the procedure of stereotactic-guided biopsy including benefits and alternatives. We discussed the high likelihood of a successful procedure. We discussed the risks of the procedure including infection, bleeding, tissue injury, clip migration, and inadequate sampling. Informed written consent was given. The usual time out protocol was performed immediately prior to the procedure. 1. Using sterile technique and 1% Lidocaine as local anesthetic, under stereotactic guidance, a 9 gauge vacuum assisted device was used to perform core needle biopsy of calcifications in the upper inner right breast using a superior approach. Specimen radiograph was performed showing at least 5 specimens with calcifications. Specimens with calcifications are identified for pathology. Lesion quadrant: Upper inner quadrant At the conclusion of the procedure, a coil tissue marker clip was deployed into the biopsy cavity. Follow-up 2-view mammogram was performed and dictated separately. 2. Using sterile technique and 1% Lidocaine as local anesthetic, under  stereotactic guidance, a 9 gauge vacuum assisted device was used to perform core needle biopsy of calcifications in the medial left breast using a superior approach. Specimen radiograph was performed showing at least 2 specimens with calcifications. Specimens  with calcifications are identified for pathology. Lesion quadrant: Lower inner quadrant At the conclusion of the procedure, a coil tissue marker clip was deployed into the biopsy cavity. Follow-up 2-view mammogram was performed and dictated separately. IMPRESSION: Stereotactic-guided biopsy of calcifications in the upper inner right breast and of calcifications in the medial left breast. There is a small left post biopsy hematoma. Electronically Signed   By: Audie Pinto M.D.   On: 11/07/2019 11:33   MM RT BREAST BX W LOC DEV 1ST LESION IMAGE BX SPEC STEREO GUIDE  Result Date: 11/07/2019 CLINICAL DATA:  74 year old female presenting for biopsy of bilateral breast calcifications. EXAM: RIGHT BREAST STEREOTACTIC CORE NEEDLE BIOPSY LEFT BREAST STEREOTACTIC CORE NEEDLE BIOPSY COMPARISON:  Previous exams. FINDINGS: The patient and I discussed the procedure of stereotactic-guided biopsy including benefits and alternatives. We discussed the high likelihood of a successful procedure. We discussed the risks of the procedure including infection, bleeding, tissue injury, clip migration, and inadequate sampling. Informed written consent was given. The usual time out protocol was performed immediately prior to the procedure. 1. Using sterile technique and 1% Lidocaine as local anesthetic, under stereotactic guidance, a 9 gauge vacuum assisted device was used to perform core needle biopsy of calcifications in the upper inner right breast using a superior approach. Specimen radiograph was performed showing at least 5 specimens with calcifications. Specimens with calcifications are identified for pathology. Lesion quadrant: Upper inner quadrant At the conclusion of  the procedure, a coil tissue marker clip was deployed into the biopsy cavity. Follow-up 2-view mammogram was performed and dictated separately. 2. Using sterile technique and 1% Lidocaine as local anesthetic, under stereotactic guidance, a 9 gauge vacuum assisted device was used to perform core needle biopsy of calcifications in the medial left breast using a superior approach. Specimen radiograph was performed showing at least 2 specimens with calcifications. Specimens with calcifications are identified for pathology. Lesion quadrant: Lower inner quadrant At the conclusion of the procedure, a coil tissue marker clip was deployed into the biopsy cavity. Follow-up 2-view mammogram was performed and dictated separately. IMPRESSION: Stereotactic-guided biopsy of calcifications in the upper inner right breast and of calcifications in the medial left breast. There is a small left post biopsy hematoma. Electronically Signed   By: Audie Pinto M.D.   On: 11/07/2019 11:33   PCV ECHOCARDIOGRAM COMPLETE  Result Date: 10/29/2019 Echocardiogram 10/25/2019: Left ventricle cavity is normal in size. Mild concentric hypertrophy of the left ventricle. Normal global wall motion. Normal LV systolic function with EF 55%. Doppler evidence of grade II (pseudonormal) diastolic dysfunction. Left atrial cavity is severely dilated. Integrity of interatrial septum could not be adequately assessed to limited visualization. Consider alternate imaging stud, if clinically indicated. Trileaflet aortic valve.  Trace aortic regurgitation. Mild tricuspid regurgitation. No evidence of pulmonary hypertension.  PCV MYOCARDIAL PERFUSION WO LEXISCAN  Result Date: 10/21/2019 Exercise Myoview stress test 10/18/2019: Exercise nuclear stress test was performed using Bruce protocol. Patient reached 5.1 METS, and 98% of age predicted maximum heart rate. Exercise capacity was low. No chest pain reported. Heart rate and hemodynamic response were  normal. Stress EKG revealed no ischemic changes. Normal myocardial perfusion. Stress LVEF calculated 49%, although visually appears normal. Low risk study.   Assessment/Plan 1. Chest pressure Reports pressure worst at night when lying down.  - EKG 12-Lead showed Afib with non-specific T abnormality HR 79 b/min similar compared to EKG done 10/10/2019 had Afib with controlled ventricular response nonspecific T abnormality HR 83 b/min  Unclear  etiology but suspect Afib related.follow up with cardiology as scheduled  - check blood pressure and Heart daily prior to taking diltiazem and Metoprolol Hold if B/p is less than 90/60 or Heart rate less than 60 b/min - Call provider's office or go to ED if symptoms worsen  2. New onset atrial fibrillation (HCC) - EKG 12-Lead as above - continue on apixaban 5 mg tablet twice daily  - Follow up with Cardiologist as scheduled   - Call provider's office or go to ED if symptoms worsen Family/ staff Communication: Reviewed plan of care with patient  Labs/tests ordered: - EKG 12-Lead  Next Appointment: As needed if symptoms worsen or fail to improve   Sandrea Hughs, NP

## 2019-11-23 ENCOUNTER — Ambulatory Visit: Payer: Medicare Other | Admitting: Cardiology

## 2019-11-23 ENCOUNTER — Encounter: Payer: Self-pay | Admitting: Cardiology

## 2019-11-23 ENCOUNTER — Other Ambulatory Visit: Payer: Self-pay

## 2019-11-23 VITALS — BP 138/76 | HR 71 | Resp 15 | Ht 67.0 in | Wt 277.0 lb

## 2019-11-23 DIAGNOSIS — R0600 Dyspnea, unspecified: Secondary | ICD-10-CM

## 2019-11-23 DIAGNOSIS — I10 Essential (primary) hypertension: Secondary | ICD-10-CM

## 2019-11-23 DIAGNOSIS — Z6841 Body Mass Index (BMI) 40.0 and over, adult: Secondary | ICD-10-CM

## 2019-11-23 DIAGNOSIS — I4819 Other persistent atrial fibrillation: Secondary | ICD-10-CM

## 2019-11-23 DIAGNOSIS — R0609 Other forms of dyspnea: Secondary | ICD-10-CM

## 2019-11-23 MED ORDER — DRONEDARONE HCL 400 MG PO TABS: 400.0000 mg | ORAL_TABLET | Freq: Two times a day (BID) | ORAL | 2 refills | Status: DC

## 2019-11-23 NOTE — Progress Notes (Signed)
Primary Physician/Referring:  Lauree Chandler, NP  Patient ID: Terry Koch, female    DOB: [birth year], 74 y.o.   MRN: [id number]  Chief Complaint  Patient presents with  . Follow-up    6 weeks  . Atrial Fibrillation  . Results   HPI:    Terry Koch  is a 74 y.o. African-American female with hypertension, hyperlipidemia, obesity, colonic polyps requiring serial surveillance colonoscopy, no prior history of GI bleed in by me about 4 weeks ago for new onset atrial fibrillation during colonoscopy.  She has noticed dyspnea on exertion and also decreased exercise tolerance and fatigue since then.  She is accompanied by her husband.  No specific new symptoms.  She is tolerating all her medications well.  Past Medical History:  Diagnosis Date  . Arthritis    self report  . Bone fracture    Right Foot  . H/O hernia repair   . History of hysterectomy   . History of mammogram    Dr.Elizabeth Gwenlyn Saran  . History of MRI    Dr.Elizabeth Gwenlyn Saran.  Marland Kitchen History of removal of skin mole 03/09/1997  . Hyperlipidemia   . Hypertension   . Scalp hematoma   . Wears glasses    Past Surgical History:  Procedure Laterality Date  . ABDOMINAL HYSTERECTOMY    . BREAST BIOPSY  03/09/1997   Dr.Finder  . CHOLECYSTECTOMY     ~ 6 yrs ago  . COLONOSCOPY  2009   Dr.Chesley  . CYSTECTOMY  09/04/2019   Dr.Gray  . GALLBLADDER SURGERY  03/10/2015   Dr.Amid [facility] )  . POLYPECTOMY     12 polyps removed at [place]  2016   Family History  Problem Relation Age of Onset  . Breast cancer Mother   . Alzheimer's disease Mother   . Dementia Mother   . Parkinson's disease Mother   . Colon polyps Mother   . Stroke Father   . Dementia Father   . Breast cancer Sister   . Allergies Daughter   . Allergies Son   . Breast cancer Maternal Aunt   . Allergies Daughter   . Colon cancer Neg Hx   . Esophageal cancer Neg Hx   . Stomach cancer Neg Hx   . Rectal cancer  Neg Hx     Social History   Tobacco Use  . Smoking status: Never Smoker  . Smokeless tobacco: Never Used  Substance Use Topics  . Alcohol use: Yes    Alcohol/week: 1.0 standard drink    Types: 1 Glasses of wine per week    Comment: occa   Marital Status: Married  ROS  Review of Systems  Constitutional: Positive for malaise/fatigue.  Cardiovascular: Positive for dyspnea on exertion. Negative for chest pain and leg swelling.  Gastrointestinal: Negative for melena.   Objective  Blood pressure 138/76, pulse 71, resp. rate 15, height 5\' 7"  (1.702 m), weight 277 lb (125.6 kg), SpO2 96 %.  Vitals with BMI 11/23/2019 11/07/2019 10/10/2019  Height 5\' 7"  5\' 7"  5\' 7"   Weight 277 lbs 277 lbs 274 lbs  BMI 43.37 48.54 62.7  Systolic 035 009 381  Diastolic 76 82 80  Pulse 71 77 77     Physical Exam Constitutional:      Comments: Morbidly obese in no acute distress.  Cardiovascular:     Rate and Rhythm: Normal rate. Rhythm irregular.     Pulses:  Carotid pulses are 2+ on the right side and 2+ on the left side.      Dorsalis pedis pulses are 2+ on the right side and 2+ on the left side.       Posterior tibial pulses are 2+ on the right side and 2+ on the left side.     Heart sounds: Normal heart sounds. No murmur heard.  No gallop.      Comments: Femoral and popliteal pulse difficult to feel due to patient's body habitus.  No leg edema. JVD difficult to see due to short neck. Pulmonary:     Effort: Pulmonary effort is normal.     Breath sounds: Normal breath sounds.  Abdominal:     General: Bowel sounds are normal.     Palpations: Abdomen is soft.     Comments: Obese. Pannus present    Laboratory examination:   Recent Labs    10/04/19 1617  NA 142  K 3.7  CL 105  CO2 28  GLUCOSE 91  BUN 9  CREATININE 0.94*  CALCIUM 9.1  GFRNONAA 60  GFRAA 69   CrCl cannot be calculated (Patient's most recent lab result is older than the maximum 21 days allowed.).  CMP Latest  Ref Rng & Units 10/04/2019  Glucose 65 - 139 mg/dL 91  BUN 7 - 25 mg/dL 9  Creatinine 0.60 - 0.93 mg/dL 0.94(H)  Sodium 135 - 146 mmol/L 142  Potassium 3.5 - 5.3 mmol/L 3.7  Chloride 98 - 110 mmol/L 105  CO2 20 - 32 mmol/L 28  Calcium 8.6 - 10.4 mg/dL 9.1   CBC Latest Ref Rng & Units 10/04/2019  WBC 3.8 - 10.8 Thousand/uL 6.8  Hemoglobin 11.7 - 15.5 g/dL 12.7  Hematocrit 35 - 45 % 40.6  Platelets 140 - 400 Thousand/uL 211    Lipid Panel No results for input(s): CHOL, TRIG, LDLCALC, VLDL, HDL, CHOLHDL, LDLDIRECT in the last 8760 hours.  HEMOGLOBIN A1C No results found for: HGBA1C, MPG TSH Recent Labs    10/04/19 1617  TSH 0.95   Medications and allergies  No Known Allergies   Outpatient Medications Prior to Visit  Medication Sig Dispense Refill  . apixaban (ELIQUIS) 5 MG TABS tablet Take 1 tablet (5 mg total) by mouth 2 (two) times daily. 60 tablet 2  . Cholecalciferol (VITAMIN D3) 125 MCG (5000 UT) TABS Take by mouth daily.    . metoprolol succinate (TOPROL-XL) 50 MG 24 hr tablet TAKE 1 TABLET(50 MG) BY MOUTH DAILY 90 tablet 0  . Multiple Vitamins-Minerals (COMPLETE MULTIVITAMIN/MINERAL PO) Take by mouth daily.    . OMEGA-3 KRILL OIL PO Take 500 mg by mouth daily.    . rosuvastatin (CRESTOR) 5 MG tablet Take 5 mg by mouth at bedtime.    . valsartan-hydrochlorothiazide (DIOVAN-HCT) 160-12.5 MG tablet Take 1 tablet by mouth daily.    Marland Kitchen ZINC OXIDE PO Take 22 mg by mouth daily.    Marland Kitchen diltiazem (CARDIZEM CD) 180 MG 24 hr capsule Take 1 capsule (180 mg total) by mouth daily. 30 capsule 2  . Aspirin 81 MG CAPS Take 1 capsule by mouth daily. (Patient not taking: Reported on 11/23/2019)    . BLACK ELDERBERRY PO Take 1,000 mg by mouth daily. (Patient not taking: Reported on 11/23/2019)     No facility-administered medications prior to visit.     Radiology:   No results found.  Cardiac Studies:   Exercise Myoview stress test 10/18/2019: Exercise nuclear stress test was  performed using  Bruce protocol. Patient reached 5.1 METS, and 98% of age predicted maximum heart rate. Exercise capacity was low. No chest pain reported. Heart rate and hemodynamic response were normal. Stress EKG revealed no ischemic changes. Normal myocardial perfusion. Stress LVEF calculated 49%, although visually appears normal. Low risk study.  Echocardiogram 10/25/2019:  Left ventricle cavity is normal in size. Mild concentric hypertrophy of  the left ventricle. Normal global wall motion. Normal LV systolic function with EF 55%. Doppler evidence of grade II (pseudonormal) diastolic dysfunction.  Left atrial cavity is severely dilated. Integrity of interatrial septum could not be adequately assessed to limited visualization. Consider  alternate imaging study, if clinically indicated.  Trileaflet aortic valve. Trace aortic regurgitation.  Mild tricuspid regurgitation. No evidence of pulmonary hypertension.  EKG  EKG 11/23/2019: Atrial fibrillation with controlled ventricular response at rate of 76 bpm, left axis deviation, poor R wave progression, cannot exclude anteroseptal infarct old.  Nonspecific T abnormality.  No significant change from 10/10/2019.   Assessment     ICD-10-CM   1. Persistent atrial fibrillation (HCC)  I48.19 EKG 02-HENI    Basic metabolic panel    CBC    flecainide (TAMBOCOR) 50 MG tablet    DISCONTINUED: dronedarone (MULTAQ) 400 MG tablet  2. Dyspnea on exertion  R06.00   3. Primary hypertension  I10   4. Class 3 severe obesity due to excess calories with serious comorbidity and body mass index (BMI) of 40.0 to 44.9 in adult (HCC)  E66.01    Z68.41      CHA2DS2-VASc Score is 3.  Yearly risk of stroke: 3.2% (A, F, HTN).  Score of 1=0.6; 2=2.2; 3=3.2; 4=4.8; 5=7.2; 6=9.8; 7=>9.8) -(CHF; HTN; vasc disease DM,  Female = 1; Age <65 =0; 65-74 = 1,  >75 =2; stroke/embolism= 2).  Medications Discontinued During This Encounter  Medication Reason  . Aspirin 81 MG CAPS  Change in therapy  . BLACK ELDERBERRY PO Patient Preference  . diltiazem (CARDIZEM CD) 180 MG 24 hr capsule Change in therapy  . dronedarone (MULTAQ) 400 MG tablet Cost of medication    Meds ordered this encounter  Medications  . DISCONTD: dronedarone (MULTAQ) 400 MG tablet    Sig: Take 1 tablet (400 mg total) by mouth 2 (two) times daily with a meal.    Dispense:  60 tablet    Refill:  2  . flecainide (TAMBOCOR) 50 MG tablet    Sig: Take 1 tablet (50 mg total) by mouth 2 (two) times daily.    Dispense:  60 tablet    Refill:  2    Recommendations:   Terry Koch is a 74 y.o. African-American female with hypertension, hyperlipidemia, obesity, colonic polyps requiring serial surveillance colonoscopy, no prior history of GI bleed in by me about 4 weeks ago for new onset atrial fibrillation during colonoscopy.  She has noticed dyspnea on exertion and also decreased exercise tolerance and fatigue since then.  I reviewed her stress test and also echocardiogram.  Nonischemic stress test and LVEF is preserved.  In view of obesity and her age, I suspect cardioversion may be temporary, I would like to use antiarrhythmic therapy.  I will prescribe her Multaq 400 mg p.o. twice daily and set her up for direct-current cardioversion.  I will go ahead and discontinue diltiazem CD as she is also on metoprolol and now she will be on antiarrhythmic therapy to reduce the risk of heart block.  If Multaq is very expensive, we could certainly try flecainide as  well at 50 mg p.o. twice daily.  I would like to see her back after the cardioversion and make further recommendations.  (Multaq not covered by insurance, will Rx Flecainide)   Schedule for Direct current cardioversion. I have discussed regarding risks benefits rate control vs rhythm control with the patient. Patient understands cardiac arrest and need for CPR, aspiration pneumonia, but not limited to these. Patient is willing.  This was a level 5  visit with discussions regarding management of complex atrial arrhythmias, discussions regarding medication effects.   Adrian Prows, MD, [facility] 11/24/2019, 3:15 PM Office: [phone]

## 2019-11-24 MED ORDER — FLECAINIDE ACETATE 50 MG PO TABS: 50.0000 mg | ORAL_TABLET | Freq: Two times a day (BID) | ORAL | 2 refills | Status: DC

## 2019-12-06 ENCOUNTER — Other Ambulatory Visit: Payer: Self-pay

## 2019-12-06 ENCOUNTER — Ambulatory Visit (INDEPENDENT_AMBULATORY_CARE_PROVIDER_SITE_OTHER): Payer: Medicare Other | Admitting: Nurse Practitioner

## 2019-12-06 ENCOUNTER — Encounter: Payer: Self-pay | Admitting: Nurse Practitioner

## 2019-12-06 VITALS — BP 138/84 | HR 97 | Temp 98.0°F | Ht 67.0 in | Wt 280.2 lb

## 2019-12-06 DIAGNOSIS — Z23 Encounter for immunization: Secondary | ICD-10-CM | POA: Diagnosis not present

## 2019-12-06 DIAGNOSIS — I4819 Other persistent atrial fibrillation: Secondary | ICD-10-CM | POA: Diagnosis not present

## 2019-12-06 DIAGNOSIS — E782 Mixed hyperlipidemia: Secondary | ICD-10-CM

## 2019-12-06 DIAGNOSIS — R0683 Snoring: Secondary | ICD-10-CM

## 2019-12-06 DIAGNOSIS — I10 Essential (primary) hypertension: Secondary | ICD-10-CM

## 2019-12-06 DIAGNOSIS — Z1159 Encounter for screening for other viral diseases: Secondary | ICD-10-CM

## 2019-12-06 NOTE — Patient Instructions (Signed)
To make appt for fasting blood work within the next week

## 2019-12-06 NOTE — Progress Notes (Signed)
Careteam: Patient Care Team: Lauree Chandler, NP as PCP - General (Geriatric Medicine)  PLACE OF SERVICE:  [place] Directive information    No Known Allergies  Chief Complaint  Patient presents with  . Medical Management of Chronic Issues    2 Month follow up. Wants to discuss taking flu Vaccine with Janett Billow. Has CardioVersion on 12/25/19 with Dr. Einar Gip     HPI: Patient is a 74 y.o. female for routine follow up In the last year since she has moved she has had 9-11 polyps removed, cyst removed, a fib and now cardiologist  recommending cardioversion, mammogram s/p breast biopsies.   Reports increase in fatigue. Easily short of breath.   Not happy about her weight.   A fib- continues on flecainide for rate control with metoprolol. On eliquis 5 mg BID for anticoagulation.   htn- controlled on valsartan-hctz with metoprolol.   Obesity "weight is out of control" does a lot of cooking and visit a lot of cuisine. She is not as active as she use to be.  Knows what she needs to do but just has not done it.   Reports she is stressed over her son who will not take the COVID vaccine.    Hyperlipidemia- on crestor 5 mg daily for cholesterol.   Never had flu shot. No reason, just did not get it.   Review of Systems:  Review of Systems  Constitutional: Positive for malaise/fatigue. Negative for chills, fever and weight loss.  HENT: Negative for tinnitus.   Respiratory: Negative for cough, sputum production and shortness of breath.        Shortness of breath on exertion   Cardiovascular: Negative for chest pain, palpitations and leg swelling.  Gastrointestinal: Negative for abdominal pain, constipation, diarrhea and heartburn.  Genitourinary: Negative for dysuria, frequency and urgency.  Musculoskeletal: Negative for back pain, falls, joint pain and myalgias.  Skin: Negative.   Neurological: Negative for dizziness and headaches.  Psychiatric/Behavioral: Negative  for depression and memory loss. The patient does not have insomnia.     Past Medical History:  Diagnosis Date  . Arthritis    self report  . Bone fracture    Right Foot  . H/O hernia repair   . History of hysterectomy   . History of mammogram    Dr.Elizabeth Gwenlyn Saran  . History of MRI    Dr.Elizabeth Gwenlyn Saran.  Marland Kitchen History of removal of skin mole 03/09/1997  . Hyperlipidemia   . Hypertension   . Scalp hematoma   . Wears glasses    Past Surgical History:  Procedure Laterality Date  . ABDOMINAL HYSTERECTOMY    . BREAST BIOPSY  03/09/1997   Dr.Finder  . CHOLECYSTECTOMY     ~ 6 yrs ago  . COLONOSCOPY  2009   Dr.Chesley  . CYSTECTOMY  09/04/2019   Dr.Gray  . GALLBLADDER SURGERY  03/10/2015   Dr.Amid [facility] )  . POLYPECTOMY     12 polyps removed at [place]  2016   Social History:   reports that she has never smoked. She has never used smokeless tobacco. She reports current alcohol use of about 1.0 standard drink of alcohol per week. She reports that she does not use drugs.  Family History  Problem Relation Age of Onset  . Breast cancer Mother   . Alzheimer's disease Mother   . Dementia Mother   . Parkinson's disease Mother   . Colon polyps  Mother   . Stroke Father   . Dementia Father   . Breast cancer Sister   . Allergies Daughter   . Allergies Son   . Breast cancer Maternal Aunt   . Allergies Daughter   . Colon cancer Neg Hx   . Esophageal cancer Neg Hx   . Stomach cancer Neg Hx   . Rectal cancer Neg Hx     Medications: Patient's Medications  New Prescriptions   No medications on file  Previous Medications   APIXABAN (ELIQUIS) 5 MG TABS TABLET    Take 1 tablet (5 mg total) by mouth 2 (two) times daily.   CHOLECALCIFEROL (VITAMIN D3) 125 MCG (5000 UT) TABS    Take by mouth daily.   FLECAINIDE (TAMBOCOR) 50 MG TABLET    Take 1 tablet (50 mg total) by mouth 2 (two) times daily.   METOPROLOL SUCCINATE (TOPROL-XL) 50 MG 24 HR TABLET     TAKE 1 TABLET(50 MG) BY MOUTH DAILY   MULTIPLE VITAMINS-MINERALS (COMPLETE MULTIVITAMIN/MINERAL PO)    Take by mouth daily.   OMEGA-3 KRILL OIL PO    Take 500 mg by mouth daily.   ROSUVASTATIN (CRESTOR) 5 MG TABLET    Take 5 mg by mouth at bedtime.   VALSARTAN-HYDROCHLOROTHIAZIDE (DIOVAN-HCT) 160-12.5 MG TABLET    Take 1 tablet by mouth daily.   ZINC OXIDE PO    Take 22 mg by mouth daily.  Modified Medications   No medications on file  Discontinued Medications   No medications on file    Physical Exam:  Vitals:   12/06/19 1304  BP: 138/84  Pulse: 97  Temp: 98 F (36.7 C)  TempSrc: Oral  SpO2: 99%  Weight: 280 lb 3.2 oz (127.1 kg)  Height: 5\' 7"  (1.702 m)   Body mass index is 43.89 kg/m. Wt Readings from Last 3 Encounters:  12/06/19 280 lb 3.2 oz (127.1 kg)  11/23/19 277 lb (125.6 kg)  11/07/19 277 lb (125.6 kg)    Physical Exam Constitutional:      General: She is not in acute distress.    Appearance: She is well-developed. She is not diaphoretic.  HENT:     Head: Normocephalic and atraumatic.     Mouth/Throat:     Pharynx: No oropharyngeal exudate.  Eyes:     Conjunctiva/sclera: Conjunctivae normal.     Pupils: Pupils are equal, round, and reactive to light.  Cardiovascular:     Rate and Rhythm: Normal rate. Rhythm irregular.     Heart sounds: Normal heart sounds.  Pulmonary:     Effort: Pulmonary effort is normal.     Breath sounds: Normal breath sounds.  Abdominal:     General: Bowel sounds are normal.     Palpations: Abdomen is soft.  Musculoskeletal:        General: No tenderness.     Cervical back: Normal range of motion and neck supple.  Skin:    General: Skin is warm and dry.  Neurological:     Mental Status: She is alert and oriented to person, place, and time.  Psychiatric:        Mood and Affect: Mood normal.        Behavior: Behavior normal.     Labs reviewed: Basic Metabolic Panel: Recent Labs    10/04/19 1617  NA 142  K 3.7  CL  105  CO2 28  GLUCOSE 91  BUN 9  CREATININE 0.94*  CALCIUM 9.1  MG 1.9  TSH 0.95  Liver Function Tests: No results for input(s): AST, ALT, ALKPHOS, BILITOT, PROT, ALBUMIN in the last 8760 hours. No results for input(s): LIPASE, AMYLASE in the last 8760 hours. No results for input(s): AMMONIA in the last 8760 hours. CBC: Recent Labs    10/04/19 1617  WBC 6.8  NEUTROABS 4,332  HGB 12.7  HCT 40.6  MCV 84.1  PLT 211   Lipid Panel: No results for input(s): CHOL, HDL, LDLCALC, TRIG, CHOLHDL, LDLDIRECT in the last 8760 hours. TSH: Recent Labs    10/04/19 1617  TSH 0.95   A1C: No results found for: HGBA1C   Assessment/Plan 1. Essential hypertension -stable on valsartan-hctz and metoprolol.  - COMPLETE METABOLIC PANEL WITH GFR; Future - CBC with Differential/Platelet; Future  2. Mixed hyperlipidemia Continues on crestor 5 mg daily, encouraged dietary modifications.  - Lipid panel; Future - COMPLETE METABOLIC PANEL WITH GFR; Future  3. Morbid obesity ([place]) Encouraged weight loss, reports she knows that she needs to make dietary modifications and the changes that need to be made but just had to implement them.   4. Persistent atrial fibrillation ([place]) -followed by cardiology, plans for cardioversion. Continues on flecainide, metoprolol for rate control and eliquis for a fib   5. Need for hepatitis C screening test - Hepatitis C antibody; Future  6. Need for influenza vaccination - Flu Vaccine QUAD High Dose(Fluad)  7. Snores -husband has noticed that she snores and reports irregular breathing pattern, with her increase in fatigue will have her evaluated with OSA.  - Ambulatory referral to Pulmonology  Next appt: 6 months, needs fasting blood work this week.  Carlos American. [place], [facility] [phone]

## 2019-12-07 ENCOUNTER — Other Ambulatory Visit: Payer: Self-pay

## 2019-12-07 ENCOUNTER — Other Ambulatory Visit: Payer: Medicare Other

## 2019-12-08 ENCOUNTER — Other Ambulatory Visit: Payer: Medicare Other

## 2019-12-08 LAB — CBC WITH DIFFERENTIAL/PLATELET
Absolute Monocytes: 428 cells/uL (ref 200–950)
Basophils Absolute: 29 cells/uL (ref 0–200)
Basophils Relative: 0.5 %
Eosinophils Absolute: 68 cells/uL (ref 15–500)
Eosinophils Relative: 1.2 %
HCT: 40.5 % (ref 35.0–45.0)
Hemoglobin: 13 g/dL (ref 11.7–15.5)
Lymphs Abs: 1442 cells/uL (ref 850–3900)
MCH: 26.9 pg — ABNORMAL LOW (ref 27.0–33.0)
MCHC: 32.1 g/dL (ref 32.0–36.0)
MCV: 83.9 fL (ref 80.0–100.0)
MPV: 10.7 fL (ref 7.5–12.5)
Monocytes Relative: 7.5 %
Neutro Abs: 3734 cells/uL (ref 1500–7800)
Neutrophils Relative %: 65.5 %
Platelets: 215 10*3/uL (ref 140–400)
RBC: 4.83 10*6/uL (ref 3.80–5.10)
RDW: 14.8 % (ref 11.0–15.0)
Total Lymphocyte: 25.3 %
WBC: 5.7 10*3/uL (ref 3.8–10.8)

## 2019-12-08 LAB — COMPLETE METABOLIC PANEL WITH GFR
AG Ratio: 1.6 (calc) (ref 1.0–2.5)
ALT: 43 U/L — ABNORMAL HIGH (ref 6–29)
AST: 41 U/L — ABNORMAL HIGH (ref 10–35)
Albumin: 4.2 g/dL (ref 3.6–5.1)
Alkaline phosphatase (APISO): 67 U/L (ref 37–153)
BUN/Creatinine Ratio: 13 (calc) (ref 6–22)
BUN: 13 mg/dL (ref 7–25)
CO2: 29 mmol/L (ref 20–32)
Calcium: 9.7 mg/dL (ref 8.6–10.4)
Chloride: 104 mmol/L (ref 98–110)
Creat: 0.98 mg/dL — ABNORMAL HIGH (ref 0.60–0.93)
GFR, Est African American: 66 mL/min/{1.73_m2} (ref >=60)
GFR, Est Non African American: 57 mL/min/{1.73_m2} — ABNORMAL LOW (ref >=60)
Globulin: 2.6 g/dL (calc) (ref 1.9–3.7)
Glucose, Bld: 96 mg/dL (ref 65–99)
Potassium: 4 mmol/L (ref 3.5–5.3)
Sodium: 141 mmol/L (ref 135–146)
Total Bilirubin: 0.7 mg/dL (ref 0.2–1.2)
Total Protein: 6.8 g/dL (ref 6.1–8.1)

## 2019-12-08 LAB — LIPID PANEL
Cholesterol: 163 mg/dL (ref <200)
HDL: 47 mg/dL — ABNORMAL LOW (ref >=50)
LDL Cholesterol (Calc): 94 mg/dL (calc)
Non-HDL Cholesterol (Calc): 116 mg/dL (calc) (ref <130)
Total CHOL/HDL Ratio: 3.5 (calc) (ref <5.0)
Triglycerides: 123 mg/dL (ref <150)

## 2019-12-08 LAB — HEPATITIS C ANTIBODY
Hepatitis C Ab: NONREACTIVE
SIGNAL TO CUT-OFF: 0.02 (ref <1.00)

## 2019-12-21 ENCOUNTER — Other Ambulatory Visit (HOSPITAL_COMMUNITY): Payer: Medicare Other

## 2019-12-22 ENCOUNTER — Other Ambulatory Visit (HOSPITAL_COMMUNITY)
Admission: RE | Admit: 2019-12-22 | Discharge: 2019-12-22 | Disposition: A | Discharge status: Home / Self Care | Payer: Medicare Other | Source: Ambulatory Visit | Attending: Cardiology | Admitting: Cardiology

## 2019-12-22 DIAGNOSIS — Z20822 Contact with and (suspected) exposure to covid-19: Secondary | ICD-10-CM | POA: Diagnosis not present

## 2019-12-22 DIAGNOSIS — Z01812 Encounter for preprocedural laboratory examination: Secondary | ICD-10-CM | POA: Diagnosis present

## 2019-12-22 LAB — SARS CORONAVIRUS 2 (TAT 6-24 HRS): SARS Coronavirus 2: NEGATIVE

## 2019-12-25 ENCOUNTER — Other Ambulatory Visit: Payer: Self-pay

## 2019-12-25 ENCOUNTER — Encounter (HOSPITAL_COMMUNITY): Payer: Self-pay | Admitting: Cardiology

## 2019-12-25 ENCOUNTER — Ambulatory Visit (HOSPITAL_COMMUNITY): Payer: Medicare Other | Admitting: Certified Registered"

## 2019-12-25 ENCOUNTER — Ambulatory Visit (HOSPITAL_COMMUNITY)
Admission: RE | Admit: 2019-12-25 | Discharge: 2019-12-25 | Disposition: A | Discharge status: Home / Self Care | Payer: Medicare Other | Attending: Cardiology | Admitting: Cardiology

## 2019-12-25 ENCOUNTER — Encounter (HOSPITAL_COMMUNITY)
Admission: RE | Disposition: A | Discharge status: Home / Self Care | Payer: Self-pay | Source: Home / Self Care | Attending: Cardiology

## 2019-12-25 DIAGNOSIS — I10 Essential (primary) hypertension: Secondary | ICD-10-CM | POA: Diagnosis not present

## 2019-12-25 DIAGNOSIS — I4819 Other persistent atrial fibrillation: Secondary | ICD-10-CM

## 2019-12-25 DIAGNOSIS — R0609 Other forms of dyspnea: Secondary | ICD-10-CM | POA: Insufficient documentation

## 2019-12-25 DIAGNOSIS — E785 Hyperlipidemia, unspecified: Secondary | ICD-10-CM | POA: Insufficient documentation

## 2019-12-25 DIAGNOSIS — Z6841 Body Mass Index (BMI) 40.0 and over, adult: Secondary | ICD-10-CM | POA: Diagnosis not present

## 2019-12-25 DIAGNOSIS — I4891 Unspecified atrial fibrillation: Secondary | ICD-10-CM

## 2019-12-25 HISTORY — PX: CARDIOVERSION: SHX1299

## 2019-12-25 SURGERY — CARDIOVERSION
Anesthesia: General

## 2019-12-25 MED ORDER — APIXABAN 5 MG PO TABS: 5.0000 mg | ORAL_TABLET | Freq: Two times a day (BID) | ORAL | 1 refills | Status: DC

## 2019-12-25 MED ORDER — LIDOCAINE 2% (20 MG/ML) 5 ML SYRINGE
INTRAMUSCULAR | Status: DC | PRN
  Administered 2019-12-25: 40 mg via INTRAVENOUS
  Administered 2019-12-25: 60 mg via INTRAVENOUS

## 2019-12-25 MED ORDER — SODIUM CHLORIDE 0.9 % IV SOLN: INTRAVENOUS | Status: DC | PRN

## 2019-12-25 MED ORDER — PROPOFOL 10 MG/ML IV BOLUS
INTRAVENOUS | Status: DC | PRN
  Administered 2019-12-25: 60 mg via INTRAVENOUS
  Administered 2019-12-25: 30 mg via INTRAVENOUS

## 2019-12-25 NOTE — Anesthesia Preprocedure Evaluation (Addendum)
Anesthesia Evaluation  Patient identified by MRN, date of birth, ID band Patient awake    Reviewed: Allergy & Precautions, NPO status , Patient's Chart, lab work & pertinent test results  History of Anesthesia Complications Negative for: history of anesthetic complications  Airway Mallampati: II  TM Distance: >3 FB Neck ROM: Full    Dental   Pulmonary neg pulmonary ROS,    Pulmonary exam normal        Cardiovascular hypertension, + dysrhythmias Atrial Fibrillation  Rhythm:Irregular     Neuro/Psych negative neurological ROS  negative psych ROS   GI/Hepatic negative GI ROS, Neg liver ROS,   Endo/Other  Morbid obesity  Renal/GU negative Renal ROS  negative genitourinary   Musculoskeletal negative musculoskeletal ROS (+)   Abdominal   Peds  Hematology negative hematology ROS (+)   Anesthesia Other Findings  EF 55%, gr2dd, severe LAE, mild TR  Low risk myoview 10/25/19  Reproductive/Obstetrics                            Anesthesia Physical Anesthesia Plan  ASA: III  Anesthesia Plan: General   Post-op Pain Management:    Induction: Intravenous  PONV Risk Score and Plan: 3 and TIVA and Treatment may vary due to age or medical condition  Airway Management Planned: Mask  Additional Equipment: None  Intra-op Plan:   Post-operative Plan:   Informed Consent: I have reviewed the patients History and Physical, chart, labs and discussed the procedure including the risks, benefits and alternatives for the proposed anesthesia with the patient or authorized representative who has indicated his/her understanding and acceptance.       Plan Discussed with:   Anesthesia Plan Comments:        Anesthesia Quick Evaluation

## 2019-12-25 NOTE — H&P (View-Only) (Signed)
Primary Physician/Referring:  Lauree Chandler, NP  Patient ID: Terry Koch, female    DOB: 08/19/1945, 74 y.o.   MRN: [id number]  No chief complaint on file.  HPI:    Terry Koch  is a 74 y.o. African-American female with hypertension, hyperlipidemia, obesity, colonic polyps requiring serial surveillance colonoscopy, no prior history of GI bleed in by me about 4 weeks ago for new onset atrial fibrillation during colonoscopy.  She has noticed dyspnea on exertion and also decreased exercise tolerance and fatigue since then.  Today she is scheduled for direct-current cardioversion due to persistent atrial fibrillation.  Past Medical History:  Diagnosis Date  . Arthritis    self report  . Bone fracture    Right Foot  . H/O hernia repair   . History of hysterectomy   . History of mammogram    Dr.Elizabeth Gwenlyn Saran  . History of MRI    Dr.Elizabeth Gwenlyn Saran.  Marland Kitchen History of removal of skin mole 03/09/1997  . Hyperlipidemia   . Hypertension   . Scalp hematoma   . Wears glasses    Past Surgical History:  Procedure Laterality Date  . ABDOMINAL HYSTERECTOMY    . BREAST BIOPSY  03/09/1997   Dr.Finder  . CHOLECYSTECTOMY     ~ 6 yrs ago  . COLONOSCOPY  2009   Dr.Chesley  . CYSTECTOMY  09/04/2019   Dr.Gray  . GALLBLADDER SURGERY  03/10/2015   Dr.Amid [facility] )  . POLYPECTOMY     12 polyps removed at [place]  2016   Family History  Problem Relation Age of Onset  . Breast cancer Mother   . Alzheimer's disease Mother   . Dementia Mother   . Parkinson's disease Mother   . Colon polyps Mother   . Stroke Father   . Dementia Father   . Breast cancer Sister   . Allergies Daughter   . Allergies Son   . Breast cancer Maternal Aunt   . Allergies Daughter   . Colon cancer Neg Hx   . Esophageal cancer Neg Hx   . Stomach cancer Neg Hx   . Rectal cancer Neg Hx     Social History   Tobacco Use  . Smoking status: Never Smoker  .  Smokeless tobacco: Never Used  Substance Use Topics  . Alcohol use: Yes    Alcohol/week: 1.0 standard drink    Types: 1 Glasses of wine per week    Comment: occa   Marital Status: Married  ROS  Review of Systems  Constitutional: Positive for malaise/fatigue.  Cardiovascular: Positive for dyspnea on exertion. Negative for chest pain and leg swelling.  Gastrointestinal: Negative for melena.   Objective  Blood pressure (!) 151/82, pulse 79, temperature 98.4 F (36.9 C), temperature source Oral, resp. rate 20, height 5\' 7"  (1.702 m), weight 127 kg, SpO2 97 %.  Vitals with BMI 12/25/2019 12/06/2019 11/23/2019  Height 5\' 7"  5\' 7"  5\' 7"   Weight 280 lbs 280 lbs 3 oz 277 lbs  BMI 43.84 53.61 44.31  Systolic 540 086 761  Diastolic 82 84 76  Pulse 79 97 71     Physical Exam Constitutional:      Comments: Morbidly obese in no acute distress.  Cardiovascular:     Rate and Rhythm: Normal rate. Rhythm irregular.     Pulses:          Carotid pulses are 2+ on the right side and 2+ on the left  side.      Dorsalis pedis pulses are 2+ on the right side and 2+ on the left side.       Posterior tibial pulses are 2+ on the right side and 2+ on the left side.     Heart sounds: Normal heart sounds. No murmur heard.  No gallop.      Comments: Femoral and popliteal pulse difficult to feel due to patient's body habitus.  No leg edema. JVD difficult to see due to short neck. Pulmonary:     Effort: Pulmonary effort is normal.     Breath sounds: Normal breath sounds.  Abdominal:     General: Bowel sounds are normal.     Palpations: Abdomen is soft.     Comments: Obese. Pannus present    Laboratory examination:   Recent Labs    10/04/19 1617 12/07/19 1424  NA 142 141  K 3.7 4.0  CL 105 104  CO2 28 29  GLUCOSE 91 96  BUN 9 13  CREATININE 0.94* 0.98*  CALCIUM 9.1 9.7  GFRNONAA 60 57*  GFRAA 69 66   estimated creatinine clearance is 69.8 mL/min (A) (by C-G formula based on SCr of 0.98  mg/dL (H)).  CMP Latest Ref Rng & Units 12/07/2019 10/04/2019  Glucose 65 - 99 mg/dL 96 91  BUN 7 - 25 mg/dL 13 9  Creatinine 0.60 - 0.93 mg/dL 0.98(H) 0.94(H)  Sodium 135 - 146 mmol/L 141 142  Potassium 3.5 - 5.3 mmol/L 4.0 3.7  Chloride 98 - 110 mmol/L 104 105  CO2 20 - 32 mmol/L 29 28  Calcium 8.6 - 10.4 mg/dL 9.7 9.1  Total Protein 6.1 - 8.1 g/dL 6.8 -  Total Bilirubin 0.2 - 1.2 mg/dL 0.7 -  AST 10 - 35 U/L 41(H) -  ALT 6 - 29 U/L 43(H) -   CBC Latest Ref Rng & Units 12/07/2019 10/04/2019  WBC 3.8 - 10.8 Thousand/uL 5.7 6.8  Hemoglobin 11.7 - 15.5 g/dL 13.0 12.7  Hematocrit 35 - 45 % 40.5 40.6  Platelets 140 - 400 Thousand/uL 215 211    Lipid Panel Recent Labs    12/07/19 1424  CHOL 163  TRIG 123  LDLCALC 94  HDL 47*  CHOLHDL 3.5    HEMOGLOBIN A1C No results found for: HGBA1C, MPG TSH Recent Labs    10/04/19 1617  TSH 0.95   Medications and allergies  No Known Allergies   (Not in an outpatient encounter)    Radiology:   No results found.  Cardiac Studies:   Exercise Myoview stress test 10/18/2019: Exercise nuclear stress test was performed using Bruce protocol. Patient reached 5.1 METS, and 98% of age predicted maximum heart rate. Exercise capacity was low. No chest pain reported. Heart rate and hemodynamic response were normal. Stress EKG revealed no ischemic changes. Normal myocardial perfusion. Stress LVEF calculated 49%, although visually appears normal. Low risk study.  Echocardiogram 10/25/2019:  Left ventricle cavity is normal in size. Mild concentric hypertrophy of  the left ventricle. Normal global wall motion. Normal LV systolic function with EF 55%. Doppler evidence of grade II (pseudonormal) diastolic dysfunction.  Left atrial cavity is severely dilated. Integrity of interatrial septum could not be adequately assessed to limited visualization. Consider  alternate imaging study, if clinically indicated.  Trileaflet aortic valve. Trace aortic  regurgitation.  Mild tricuspid regurgitation. No evidence of pulmonary hypertension.  EKG  EKG 11/23/2019: Atrial fibrillation with controlled ventricular response at rate of 76 bpm, left axis deviation, poor  R wave progression, cannot exclude anteroseptal infarct old.  Nonspecific T abnormality.  No significant change from 10/10/2019.   Assessment   No diagnosis found.   CHA2DS2-VASc Score is 3.  Yearly risk of stroke: 3.2% (A, F, HTN).  Score of 1=0.6; 2=2.2; 3=3.2; 4=4.8; 5=7.2; 6=9.8; 7=>9.8) -(CHF; HTN; vasc disease DM,  Female = 1; Age <65 =0; 65-74 = 1,  >75 =2; stroke/embolism= 2).  Medications Discontinued During This Encounter  Medication Reason  . ZINC OXIDE PO Patient Preference    No orders of the defined types were placed in this encounter.   Recommendations:   Terry Koch is a 74 y.o. African-American female with hypertension, hyperlipidemia, obesity, colonic polyps requiring serial surveillance colonoscopy, no prior history of GI bleed and had new onset atrial fibrillation during colonoscopy.  She has noticed dyspnea on exertion and also decreased exercise tolerance and fatigue since then.  No new symptoms, she is tolerating all her medications well.  She has been compliant with taking her anticoagulation.  Schedule for Direct current cardioversion. I have discussed regarding risks benefits rate control vs rhythm control with the patient. Patient understands cardiac arrest and need for CPR, aspiration pneumonia, but not limited to these. Patient is willing.    Adrian Prows, MD, [facility] 12/25/2019, 9:41 AM Office: [phone]

## 2019-12-25 NOTE — Discharge Instructions (Signed)
CARDIOVERSION  STOP: DILTIAZEM AND FLECANIDE   YOU HAD AN CARDIAC PROCEDURE TODAY: Refer to the procedure report and other information in the discharge instructions given to you for any specific questions about what was found during the examination. If this information does not answer your questions, please call Dr. Irven Shelling office at [phone] to clarify.   DIET: Your first meal following the procedure should be a light meal and then it is ok to progress to your normal diet. A half-sandwich or bowl of soup is an example of a good first meal. Heavy or fried foods are harder to digest and may make you feel nauseous or bloated. Drink plenty of fluids but you should avoid alcoholic beverages for 24 hours. If you had a esophageal dilation, please see attached instructions for diet.   ACTIVITY: Your care partner should take you home directly after the procedure. You should plan to take it easy, moving slowly for the rest of the day. You can resume normal activity the day after the procedure however YOU SHOULD NOT DRIVE, use power tools, machinery or perform tasks that involve climbing or major physical exertion for 24 hours (because of the sedation medicines used during the test).   SYMPTOMS TO REPORT IMMEDIATELY: A cardiologist can be reached at any hour. Please call [phone] for any of the following symptoms:    FOLLOW UP:  Please also call with any specific questions about appointments or follow up tests.

## 2019-12-25 NOTE — Progress Notes (Signed)
Primary Physician/Referring:  Lauree Chandler, NP  Patient ID: Terry Koch, female    DOB: 1945-03-22, 74 y.o.   MRN: [id number]  No chief complaint on file.  HPI:    Terry Koch  is a 74 y.o. African-American female with hypertension, hyperlipidemia, obesity, colonic polyps requiring serial surveillance colonoscopy, no prior history of GI bleed in by me about 4 weeks ago for new onset atrial fibrillation during colonoscopy.  She has noticed dyspnea on exertion and also decreased exercise tolerance and fatigue since then.  Today she is scheduled for direct-current cardioversion due to persistent atrial fibrillation.  Past Medical History:  Diagnosis Date  . Arthritis    self report  . Bone fracture    Right Foot  . H/O hernia repair   . History of hysterectomy   . History of mammogram    Dr.Elizabeth Gwenlyn Saran  . History of MRI    Dr.Elizabeth Gwenlyn Saran.  Marland Kitchen History of removal of skin mole 03/09/1997  . Hyperlipidemia   . Hypertension   . Scalp hematoma   . Wears glasses    Past Surgical History:  Procedure Laterality Date  . ABDOMINAL HYSTERECTOMY    . BREAST BIOPSY  03/09/1997   Dr.Finder  . CHOLECYSTECTOMY     ~ 6 yrs ago  . COLONOSCOPY  2009   Dr.Chesley  . CYSTECTOMY  09/04/2019   Dr.Gray  . GALLBLADDER SURGERY  03/10/2015   Dr.Amid [facility] )  . POLYPECTOMY     12 polyps removed at [facility]  2016   Family History  Problem Relation Age of Onset  . Breast cancer Mother   . Alzheimer's disease Mother   . Dementia Mother   . Parkinson's disease Mother   . Colon polyps Mother   . Stroke Father   . Dementia Father   . Breast cancer Sister   . Allergies Daughter   . Allergies Son   . Breast cancer Maternal Aunt   . Allergies Daughter   . Colon cancer Neg Hx   . Esophageal cancer Neg Hx   . Stomach cancer Neg Hx   . Rectal cancer Neg Hx     Social History   Tobacco Use  . Smoking status: Never Smoker  .  Smokeless tobacco: Never Used  Substance Use Topics  . Alcohol use: Yes    Alcohol/week: 1.0 standard drink    Types: 1 Glasses of wine per week    Comment: occa   Marital Status: Married  ROS  Review of Systems  Constitutional: Positive for malaise/fatigue.  Cardiovascular: Positive for dyspnea on exertion. Negative for chest pain and leg swelling.  Gastrointestinal: Negative for melena.   Objective  Blood pressure (!) 151/82, pulse 79, temperature 98.4 F (36.9 C), temperature source Oral, resp. rate 20, height 5\' 7"  (1.702 m), weight 127 kg, SpO2 97 %.  Vitals with BMI 12/25/2019 12/06/2019 11/23/2019  Height 5\' 7"  5\' 7"  5\' 7"   Weight 280 lbs 280 lbs 3 oz 277 lbs  BMI 43.84 74.12 87.86  Systolic 767 209 470  Diastolic 82 84 76  Pulse 79 97 71     Physical Exam Constitutional:      Comments: Morbidly obese in no acute distress.  Cardiovascular:     Rate and Rhythm: Normal rate. Rhythm irregular.     Pulses:          Carotid pulses are 2+ on the right side and 2+ on the left  side.      Dorsalis pedis pulses are 2+ on the right side and 2+ on the left side.       Posterior tibial pulses are 2+ on the right side and 2+ on the left side.     Heart sounds: Normal heart sounds. No murmur heard.  No gallop.      Comments: Femoral and popliteal pulse difficult to feel due to patient's body habitus.  No leg edema. JVD difficult to see due to short neck. Pulmonary:     Effort: Pulmonary effort is normal.     Breath sounds: Normal breath sounds.  Abdominal:     General: Bowel sounds are normal.     Palpations: Abdomen is soft.     Comments: Obese. Pannus present    Laboratory examination:   Recent Labs    10/04/19 1617 12/07/19 1424  NA 142 141  K 3.7 4.0  CL 105 104  CO2 28 29  GLUCOSE 91 96  BUN 9 13  CREATININE 0.94* 0.98*  CALCIUM 9.1 9.7  GFRNONAA 60 57*  GFRAA 69 66   estimated creatinine clearance is 69.8 mL/min (A) (by C-G formula based on SCr of 0.98  mg/dL (H)).  CMP Latest Ref Rng & Units 12/07/2019 10/04/2019  Glucose 65 - 99 mg/dL 96 91  BUN 7 - 25 mg/dL 13 9  Creatinine 0.60 - 0.93 mg/dL 0.98(H) 0.94(H)  Sodium 135 - 146 mmol/L 141 142  Potassium 3.5 - 5.3 mmol/L 4.0 3.7  Chloride 98 - 110 mmol/L 104 105  CO2 20 - 32 mmol/L 29 28  Calcium 8.6 - 10.4 mg/dL 9.7 9.1  Total Protein 6.1 - 8.1 g/dL 6.8 -  Total Bilirubin 0.2 - 1.2 mg/dL 0.7 -  AST 10 - 35 U/L 41(H) -  ALT 6 - 29 U/L 43(H) -   CBC Latest Ref Rng & Units 12/07/2019 10/04/2019  WBC 3.8 - 10.8 Thousand/uL 5.7 6.8  Hemoglobin 11.7 - 15.5 g/dL 13.0 12.7  Hematocrit 35 - 45 % 40.5 40.6  Platelets 140 - 400 Thousand/uL 215 211    Lipid Panel Recent Labs    12/07/19 1424  CHOL 163  TRIG 123  LDLCALC 94  HDL 47*  CHOLHDL 3.5    HEMOGLOBIN A1C No results found for: HGBA1C, MPG TSH Recent Labs    10/04/19 1617  TSH 0.95   Medications and allergies  No Known Allergies   (Not in an outpatient encounter)    Radiology:   No results found.  Cardiac Studies:   Exercise Myoview stress test 10/18/2019: Exercise nuclear stress test was performed using Bruce protocol. Patient reached 5.1 METS, and 98% of age predicted maximum heart rate. Exercise capacity was low. No chest pain reported. Heart rate and hemodynamic response were normal. Stress EKG revealed no ischemic changes. Normal myocardial perfusion. Stress LVEF calculated 49%, although visually appears normal. Low risk study.  Echocardiogram 10/25/2019:  Left ventricle cavity is normal in size. Mild concentric hypertrophy of  the left ventricle. Normal global wall motion. Normal LV systolic function with EF 55%. Doppler evidence of grade II (pseudonormal) diastolic dysfunction.  Left atrial cavity is severely dilated. Integrity of interatrial septum could not be adequately assessed to limited visualization. Consider  alternate imaging study, if clinically indicated.  Trileaflet aortic valve. Trace aortic  regurgitation.  Mild tricuspid regurgitation. No evidence of pulmonary hypertension.  EKG  EKG 11/23/2019: Atrial fibrillation with controlled ventricular response at rate of 76 bpm, left axis deviation, poor  R wave progression, cannot exclude anteroseptal infarct old.  Nonspecific T abnormality.  No significant change from 10/10/2019.   Assessment   No diagnosis found.   CHA2DS2-VASc Score is 3.  Yearly risk of stroke: 3.2% (A, F, HTN).  Score of 1=0.6; 2=2.2; 3=3.2; 4=4.8; 5=7.2; 6=9.8; 7=>9.8) -(CHF; HTN; vasc disease DM,  Female = 1; Age <65 =0; 65-74 = 1,  >75 =2; stroke/embolism= 2).  Medications Discontinued During This Encounter  Medication Reason  . ZINC OXIDE PO Patient Preference    No orders of the defined types were placed in this encounter.   Recommendations:   Terry Koch is a 74 y.o. African-American female with hypertension, hyperlipidemia, obesity, colonic polyps requiring serial surveillance colonoscopy, no prior history of GI bleed and had new onset atrial fibrillation during colonoscopy.  She has noticed dyspnea on exertion and also decreased exercise tolerance and fatigue since then.  No new symptoms, she is tolerating all her medications well.  She has been compliant with taking her anticoagulation.  Schedule for Direct current cardioversion. I have discussed regarding risks benefits rate control vs rhythm control with the patient. Patient understands cardiac arrest and need for CPR, aspiration pneumonia, but not limited to these. Patient is willing.    Adrian Prows, MD, [facility] 12/25/2019, 9:41 AM Office: [phone]

## 2019-12-25 NOTE — Anesthesia Postprocedure Evaluation (Signed)
Anesthesia Post Note  Patient: Oncologist  Procedure(s) Performed: CARDIOVERSION (N/A )     Patient location during evaluation: Endoscopy Anesthesia Type: General Level of consciousness: awake and alert Pain management: pain level controlled Vital Signs Assessment: post-procedure vital signs reviewed and stable Respiratory status: spontaneous breathing, nonlabored ventilation and respiratory function stable Cardiovascular status: blood pressure returned to baseline and stable Postop Assessment: no apparent nausea or vomiting Anesthetic complications: no   No complications documented.  Last Vitals:  Vitals:   12/25/19 1003 12/25/19 1016  BP: (!) 159/98 (!) 151/85  Pulse: 83 66  Resp: (!) 32 18  Temp: 36.6 C   SpO2: 96% 98%    Last Pain:  Vitals:   12/25/19 1016  TempSrc: Oral  PainSc: 0-No pain                 Lidia Collum

## 2019-12-25 NOTE — CV Procedure (Signed)
Direct current cardioversion:  Indication symptomatic A. Fibrillation.  Procedure: Using 90 mg of IV Propofol and 100 IV Lidocaine (for reducing venous pain) for achieving deep sedation, synchronized direct current cardioversion performed. Patient was delivered with 150 x 1 then 200 Joules of electricity X 3 without success. Patient tolerated the procedure well. No immediate complication noted.     Adrian Prows, MD, [facility] 12/25/2019, 10:06 AM Office: [phone]

## 2019-12-25 NOTE — Interval H&P Note (Signed)
History and Physical Interval Note:  12/25/2019 9:43 AM  Terry Koch  has presented today for surgery, with the diagnosis of A-FIB.  The various methods of treatment have been discussed with the patient and family. After consideration of risks, benefits and other options for treatment, the patient has consented to  Procedure(s): CARDIOVERSION (N/A) as a surgical intervention.  The patient's history has been reviewed, patient examined, no change in status, stable for surgery.  I have reviewed the patient's chart and labs.  Questions were answered to the patient's satisfaction.     Adrian Prows

## 2019-12-25 NOTE — Transfer of Care (Signed)
Immediate Anesthesia Transfer of Care Note  Patient: Terry Koch  Procedure(s) Performed: CARDIOVERSION (N/A )  Patient Location: Endoscopy Unit  Anesthesia Type:General  Level of Consciousness: drowsy and patient cooperative  Airway & Oxygen Therapy: Patient Spontanous Breathing  Post-op Assessment: Report given to RN  Post vital signs: Reviewed and stable  Last Vitals:  Vitals Value Taken Time  BP    Temp    Pulse    Resp    SpO2      Last Pain:  Vitals:   12/25/19 0929  TempSrc: Oral  PainSc: 0-No pain         Complications: No complications documented.

## 2019-12-26 ENCOUNTER — Other Ambulatory Visit: Payer: Medicare Other

## 2019-12-27 ENCOUNTER — Encounter (HOSPITAL_COMMUNITY): Payer: Self-pay | Admitting: Cardiology

## 2019-12-28 ENCOUNTER — Telehealth: Payer: Self-pay | Admitting: Student

## 2019-12-28 NOTE — Telephone Encounter (Signed)
Patient's home health nurse called the office to notify us that patient's blood pressure today was 170/81.  Patient is asymptomatic.  Requested that home health nurse recheck patient's blood pressure tomorrow and to notify the office if it is still greater than 140/90.  Patient is currently scheduled for follow-up appointment in the office 01/02/2020.  Instructed patient to take antihypertensive medications as prescribed, will consider adjustments if needed.

## 2020-01-02 ENCOUNTER — Other Ambulatory Visit: Payer: Self-pay

## 2020-01-02 ENCOUNTER — Ambulatory Visit: Payer: Medicare Other | Admitting: Cardiology

## 2020-01-02 ENCOUNTER — Encounter: Payer: Self-pay | Admitting: Cardiology

## 2020-01-02 VITALS — BP 136/90 | HR 85 | Ht 67.0 in | Wt 278.0 lb

## 2020-01-02 DIAGNOSIS — Z6841 Body Mass Index (BMI) 40.0 and over, adult: Secondary | ICD-10-CM

## 2020-01-02 DIAGNOSIS — I4819 Other persistent atrial fibrillation: Secondary | ICD-10-CM

## 2020-01-02 DIAGNOSIS — I10 Essential (primary) hypertension: Secondary | ICD-10-CM

## 2020-01-02 MED ORDER — AMLODIPINE BESYLATE 5 MG PO TABS: 5.0000 mg | ORAL_TABLET | Freq: Every day | ORAL | 2 refills | Status: DC

## 2020-01-02 MED ORDER — AMIODARONE HCL 200 MG PO TABS: 200.0000 mg | ORAL_TABLET | Freq: Two times a day (BID) | ORAL | 2 refills | Status: DC

## 2020-01-02 NOTE — Progress Notes (Signed)
Primary Physician/Referring:  Lauree Chandler, NP  Patient ID: Terry Koch, female    DOB: [birth year], 74 y.o.   MRN: [id number]  Chief Complaint  Patient presents with  . Atrial Fibrillation    post cardioversion  . Follow-up  . Hypertension   HPI:    Terry Koch  is a 74 y.o. African-American female with hypertension, hyperlipidemia, obesity, colonic polyps requiring serial surveillance colonoscopy, no prior history of GI bleed.  Referred to our office for new onset atrial fibrillation during colonoscopy.  She also complained of dyspnea on exertion, fatigue, and decreased exercise tolerance.  Cardioversion 12/25/2019 was unsuccessful.  Patient presents for 1 week follow-up of direct-current cardioversion 12/25/2019 for persistent atrial fibrillation.  Patient was delivered 150x1 and 200 J of electricity x3 without success. Continues to have decreased exercise tolerance.  She is disappointed that she did not have successful cardioversion.  Flecainide was discontinued and diltiazem was also discontinued due to bradycardia.  Husband is present.  Past Medical History:  Diagnosis Date  . Arthritis    self report  . Bone fracture    Right Foot  . H/O hernia repair   . History of hysterectomy   . History of mammogram    Dr.Elizabeth Gwenlyn Saran  . History of MRI    Dr.Elizabeth Gwenlyn Saran.  Marland Kitchen History of removal of skin mole 03/09/1997  . Hyperlipidemia   . Hypertension   . Scalp hematoma   . Wears glasses    Past Surgical History:  Procedure Laterality Date  . ABDOMINAL HYSTERECTOMY    . BREAST BIOPSY  03/09/1997   Dr.Finder  . CARDIOVERSION N/A 12/25/2019   Procedure: CARDIOVERSION;  Surgeon: Adrian Prows, MD;  Location: [facility];  Service: Cardiovascular;  Laterality: N/A;  . CHOLECYSTECTOMY     ~ 6 yrs ago  . COLONOSCOPY  2009   Dr.Chesley  . CYSTECTOMY  09/04/2019   Dr.Gray  . GALLBLADDER SURGERY  03/10/2015   Dr.Amid [facility] )  . POLYPECTOMY     12  polyps removed at [facility]  2016   Family History  Problem Relation Age of Onset  . Breast cancer Mother   . Alzheimer's disease Mother   . Dementia Mother   . Parkinson's disease Mother   . Colon polyps Mother   . Stroke Father   . Dementia Father   . Breast cancer Sister   . Allergies Daughter   . Allergies Son   . Breast cancer Maternal Aunt   . Allergies Daughter   . Colon cancer Neg Hx   . Esophageal cancer Neg Hx   . Stomach cancer Neg Hx   . Rectal cancer Neg Hx     Social History   Tobacco Use  . Smoking status: Never Smoker  . Smokeless tobacco: Never Used  Substance Use Topics  . Alcohol use: Yes    Alcohol/week: 1.0 standard drink    Types: 1 Glasses of wine per week    Comment: occa   Marital Status: Married  ROS  Review of Systems  Constitutional: Positive for malaise/fatigue.  Cardiovascular: Positive for dyspnea on exertion. Negative for chest pain and leg swelling.  Gastrointestinal: Negative for melena.   Objective  Blood pressure 136/90, pulse 85, height 5\' 7"  (1.702 m), weight 278 lb (126.1 kg), SpO2 97 %.  Vitals with BMI 01/02/2020 12/25/2019 12/25/2019  Height 5\' 7"  - -  Weight 278 lbs - -  BMI 43.53 - -  Systolic 607 371 062  Diastolic 90 85 98  Pulse 85 66 83     Physical Exam Constitutional:      Comments: Morbidly obese in no acute distress.  Cardiovascular:     Rate and Rhythm: Normal rate. Rhythm irregular.     Pulses:          Carotid pulses are 2+ on the right side and 2+ on the left side.      Dorsalis pedis pulses are 2+ on the right side and 2+ on the left side.       Posterior tibial pulses are 2+ on the right side and 2+ on the left side.     Heart sounds: Normal heart sounds. No murmur heard.  No gallop.      Comments: Femoral and popliteal pulse difficult to feel due to patient's body habitus.  No leg edema. JVD difficult to see due to short neck. Pulmonary:     Effort: Pulmonary effort is  normal.     Breath sounds: Normal breath sounds.  Abdominal:     General: Bowel sounds are normal.     Palpations: Abdomen is soft.     Comments: Obese. Pannus present    Laboratory examination:   Recent Labs    10/04/19 1617 12/07/19 1424  NA 142 141  K 3.7 4.0  CL 105 104  CO2 28 29  GLUCOSE 91 96  BUN 9 13  CREATININE 0.94* 0.98*  CALCIUM 9.1 9.7  GFRNONAA 60 57*  GFRAA 69 66   CrCl cannot be calculated (Patient's most recent lab result is older than the maximum 21 days allowed.).  CMP Latest Ref Rng & Units 12/07/2019 10/04/2019  Glucose 65 - 99 mg/dL 96 91  BUN 7 - 25 mg/dL 13 9  Creatinine 0.60 - 0.93 mg/dL 0.98(H) 0.94(H)  Sodium 135 - 146 mmol/L 141 142  Potassium 3.5 - 5.3 mmol/L 4.0 3.7  Chloride 98 - 110 mmol/L 104 105  CO2 20 - 32 mmol/L 29 28  Calcium 8.6 - 10.4 mg/dL 9.7 9.1  Total Protein 6.1 - 8.1 g/dL 6.8 -  Total Bilirubin 0.2 - 1.2 mg/dL 0.7 -  AST 10 - 35 U/L 41(H) -  ALT 6 - 29 U/L 43(H) -   CBC Latest Ref Rng & Units 12/07/2019 10/04/2019  WBC 3.8 - 10.8 Thousand/uL 5.7 6.8  Hemoglobin 11.7 - 15.5 g/dL 13.0 12.7  Hematocrit 35 - 45 % 40.5 40.6  Platelets 140 - 400 Thousand/uL 215 211    Lipid Panel Recent Labs    12/07/19 1424  CHOL 163  TRIG 123  LDLCALC 94  HDL 47*  CHOLHDL 3.5    HEMOGLOBIN A1C No results found for: HGBA1C, MPG TSH Recent Labs    10/04/19 1617  TSH 0.95   Medications and allergies  No Known Allergies   Outpatient Medications Prior to Visit  Medication Sig Dispense Refill  . apixaban (ELIQUIS) 5 MG TABS tablet Take 1 tablet (5 mg total) by mouth 2 (two) times daily. 180 tablet 1  . APPLE CIDER VINEGAR PO Take 1 tablet by mouth daily.    . Cholecalciferol (VITAMIN D3) 125 MCG (5000 UT) TABS Take 5,000 Units by mouth daily.     Marland Kitchen CRANBERRY PO Take 1 tablet by mouth daily at 2 PM. [employer]    . ELDERBERRY PO Take 50 mg by mouth daily.    . metoprolol succinate (TOPROL-XL) 50 MG 24 hr tablet TAKE 1  TABLET(50  MG) BY MOUTH DAILY (Patient taking differently: Take 50 mg by mouth daily. ) 90 tablet 0  . Multiple Vitamins-Minerals (COMPLETE MULTIVITAMIN/MINERAL PO) Take 1 tablet by mouth daily. [employer] Vitamin    . Multiple Vitamins-Minerals (EMERGEN-C IMMUNE PO) Take 1 tablet by mouth daily. Vit D  Zinc    . OMEGA-3 KRILL OIL PO Take 350 mg by mouth daily. Omega 3    . Psyllium (METAMUCIL FIBER PO) Take 10 mLs by mouth 3 (three) times a week. With water    . rosuvastatin (CRESTOR) 5 MG tablet Take 5 mg by mouth at bedtime.    . valsartan-hydrochlorothiazide (DIOVAN-HCT) 160-12.5 MG tablet Take 1 tablet by mouth daily.     No facility-administered medications prior to visit.     Radiology:   No results found.  Cardiac Studies:   Exercise Myoview stress test 10/18/2019: Exercise nuclear stress test was performed using Bruce protocol. Patient reached 5.1 METS, and 98% of age predicted maximum heart rate. Exercise capacity was low. No chest pain reported. Heart rate and hemodynamic response were normal. Stress EKG revealed no ischemic changes. Normal myocardial perfusion. Stress LVEF calculated 49%, although visually appears normal. Low risk study.  Echocardiogram 10/25/2019:  Left ventricle cavity is normal in size. Mild concentric hypertrophy of  the left ventricle. Normal global wall motion. Normal LV systolic function with EF 55%. Doppler evidence of grade II (pseudonormal) diastolic dysfunction.  Left atrial cavity is severely dilated. Integrity of interatrial septum could not be adequately assessed to limited visualization. Consider  alternate imaging study, if clinically indicated.  Trileaflet aortic valve. Trace aortic regurgitation.  Mild tricuspid regurgitation. No evidence of pulmonary hypertension.  Cardioversion 12/25/2019:  Indication symptomatic A. Fibrillation. Procedure: Using 90 mg of IV Propofol and 100 IV Lidocaine (for reducing venous pain) for achieving deep  sedation, synchronized direct current cardioversion performed. Patient was delivered with 150 x 1 then 200 Joules of electricity X 3 without success. Patient tolerated the procedure well. No immediate complication noted.     EKG:   EKG 01/02/2020: Atrial fibrillation with controlled ventricular response at the rate of 74 bpm, leftward axis, poor R wave progression, cannot exclude anteroseptal infarct old.  Nonspecific T abnormality.  No significant change from 11/23/2019.  Assessment     ICD-10-CM   1. Persistent atrial fibrillation (HCC)  I48.19 EKG 12-Lead    amiodarone (PACERONE) 200 MG tablet  2. Primary hypertension  I10 amLODipine (NORVASC) 5 MG tablet  3. Class 3 severe obesity due to excess calories with serious comorbidity and body mass index (BMI) of 40.0 to 44.9 in adult (HCC)  E66.01    Z68.41      CHA2DS2-VASc Score is 3.  Yearly risk of stroke: 3.2% (A, F, HTN).  Score of 1=0.6; 2=2.2; 3=3.2; 4=4.8; 5=7.2; 6=9.8; 7=>9.8) -(CHF; HTN; vasc disease DM,  Female = 1; Age <65 =0; 65-74 = 1,  >75 =2; stroke/embolism= 2).  There are no discontinued medications.  Meds ordered this encounter  Medications  . amLODipine (NORVASC) 5 MG tablet    Sig: Take 1 tablet (5 mg total) by mouth daily.    Dispense:  30 tablet    Refill:  2  . amiodarone (PACERONE) 200 MG tablet    Sig: Take 1 tablet (200 mg total) by mouth 2 (two) times daily.    Dispense:  60 tablet    Refill:  2    Recommendations:   Terry Koch is a 74 y.o. African-American female with hypertension,  hyperlipidemia, obesity, colonic polyps requiring serial surveillance colonoscopy, no prior history of GI bleed.  Referred to our office for new onset atrial fibrillation during colonoscopy.  She also complained of dyspnea on exertion, fatigue, and decreased exercise tolerance.  Cardioversion 12/25/2019 was unsuccessful.   Patient presents for 1 week follow-up of direct-current cardioversion 12/25/2019 for persistent  atrial fibrillation.  Patient was delivered 150x1 and 200 J of electricity x3 without success. Continues to have decreased exercise tolerance.  During hospitalization flecainide was discontinued along with metoprolol due to bradycardia.  I will start the patient on amiodarone 200 mg p.o. twice daily and reattempt cardioversion in 2 weeks.  For hypertension I have added amlodipine 5 mg daily.  I would like to see her back in 4 weeks for follow-up.  She has been scheduled for sleep study and advised her to continue to try to lose weight and to keep the sleep study appointment.   Adrian Prows, MD, [facility] 01/02/2020, 3:51 PM Office: [phone] Pager: [phone]

## 2020-01-03 ENCOUNTER — Other Ambulatory Visit: Payer: Self-pay | Admitting: Cardiology

## 2020-01-03 ENCOUNTER — Other Ambulatory Visit: Payer: Self-pay | Admitting: Nurse Practitioner

## 2020-01-03 DIAGNOSIS — I4891 Unspecified atrial fibrillation: Secondary | ICD-10-CM

## 2020-01-03 DIAGNOSIS — I4819 Other persistent atrial fibrillation: Secondary | ICD-10-CM

## 2020-01-03 DIAGNOSIS — I10 Essential (primary) hypertension: Secondary | ICD-10-CM

## 2020-01-05 ENCOUNTER — Telehealth: Payer: Self-pay | Admitting: Cardiology

## 2020-01-05 DIAGNOSIS — R0600 Dyspnea, unspecified: Secondary | ICD-10-CM

## 2020-01-05 DIAGNOSIS — R0609 Other forms of dyspnea: Secondary | ICD-10-CM

## 2020-01-05 DIAGNOSIS — I10 Essential (primary) hypertension: Secondary | ICD-10-CM

## 2020-01-05 DIAGNOSIS — I4819 Other persistent atrial fibrillation: Secondary | ICD-10-CM

## 2020-01-05 DIAGNOSIS — Z6841 Body Mass Index (BMI) 40.0 and over, adult: Secondary | ICD-10-CM

## 2020-01-05 NOTE — Telephone Encounter (Signed)
I have reviewed home health plans of care and signed off    ICD-10-CM   1. Persistent atrial fibrillation (HCC)  I48.19   2. Primary hypertension  I10   3. Class 3 severe obesity due to excess calories with serious comorbidity and body mass index (BMI) of 40.0 to 44.9 in adult (HCC)  E66.01    Z68.41   4. Dyspnea on exertion  R06.00       Adrian Prows, MD, [facility] 01/05/2020, 7:35 AM Office: [phone] Pager: [phone]

## 2020-01-12 ENCOUNTER — Other Ambulatory Visit (HOSPITAL_COMMUNITY): Payer: Medicare Other

## 2020-02-02 ENCOUNTER — Other Ambulatory Visit (HOSPITAL_COMMUNITY)
Admission: RE | Admit: 2020-02-02 | Discharge: 2020-02-02 | Disposition: A | Discharge status: Home / Self Care | Payer: Medicare Other | Source: Ambulatory Visit | Attending: Cardiology | Admitting: Cardiology

## 2020-02-02 DIAGNOSIS — Z20822 Contact with and (suspected) exposure to covid-19: Secondary | ICD-10-CM | POA: Diagnosis not present

## 2020-02-02 DIAGNOSIS — Z01812 Encounter for preprocedural laboratory examination: Secondary | ICD-10-CM | POA: Diagnosis present

## 2020-02-02 LAB — SARS CORONAVIRUS 2 (TAT 6-24 HRS): SARS Coronavirus 2: NEGATIVE

## 2020-02-06 ENCOUNTER — Ambulatory Visit (HOSPITAL_COMMUNITY): Payer: Medicare Other | Admitting: Certified Registered Nurse Anesthetist

## 2020-02-06 ENCOUNTER — Ambulatory Visit (HOSPITAL_COMMUNITY)
Admission: RE | Admit: 2020-02-06 | Discharge: 2020-02-06 | Disposition: A | Discharge status: Home / Self Care | Payer: Medicare Other | Attending: Cardiology | Admitting: Cardiology

## 2020-02-06 ENCOUNTER — Encounter (HOSPITAL_COMMUNITY)
Admission: RE | Disposition: A | Discharge status: Home / Self Care | Payer: Self-pay | Source: Home / Self Care | Attending: Cardiology

## 2020-02-06 ENCOUNTER — Encounter (HOSPITAL_COMMUNITY): Payer: Self-pay | Admitting: Cardiology

## 2020-02-06 DIAGNOSIS — R0609 Other forms of dyspnea: Secondary | ICD-10-CM | POA: Diagnosis not present

## 2020-02-06 DIAGNOSIS — I4819 Other persistent atrial fibrillation: Secondary | ICD-10-CM | POA: Diagnosis not present

## 2020-02-06 DIAGNOSIS — E669 Obesity, unspecified: Secondary | ICD-10-CM | POA: Diagnosis not present

## 2020-02-06 DIAGNOSIS — I10 Essential (primary) hypertension: Secondary | ICD-10-CM | POA: Diagnosis not present

## 2020-02-06 DIAGNOSIS — E785 Hyperlipidemia, unspecified: Secondary | ICD-10-CM | POA: Diagnosis not present

## 2020-02-06 DIAGNOSIS — Z6841 Body Mass Index (BMI) 40.0 and over, adult: Secondary | ICD-10-CM | POA: Insufficient documentation

## 2020-02-06 HISTORY — PX: CARDIOVERSION: SHX1299

## 2020-02-06 LAB — POCT I-STAT, CHEM 8
BUN: 9 mg/dL (ref 8–23)
Calcium, Ion: 1.15 mmol/L (ref 1.15–1.40)
Chloride: 104 mmol/L (ref 98–111)
Creatinine, Ser: 0.9 mg/dL (ref 0.44–1.00)
Glucose, Bld: 104 mg/dL — ABNORMAL HIGH (ref 70–99)
HCT: 40 % (ref 36.0–46.0)
Hemoglobin: 13.6 g/dL (ref 12.0–15.0)
Potassium: 3.5 mmol/L (ref 3.5–5.1)
Sodium: 141 mmol/L (ref 135–145)
TCO2: 26 mmol/L (ref 22–32)

## 2020-02-06 SURGERY — CARDIOVERSION
Anesthesia: General

## 2020-02-06 MED ORDER — PROPOFOL 10 MG/ML IV BOLUS
INTRAVENOUS | Status: DC | PRN
  Administered 2020-02-06: 100 mg via INTRAVENOUS

## 2020-02-06 MED ORDER — AMIODARONE HCL 200 MG PO TABS: 200.0000 mg | ORAL_TABLET | Freq: Every day | ORAL | 2 refills | Status: DC

## 2020-02-06 MED ORDER — LIDOCAINE 2% (20 MG/ML) 5 ML SYRINGE
INTRAMUSCULAR | Status: DC | PRN
  Administered 2020-02-06: 40 mg via INTRAVENOUS

## 2020-02-06 NOTE — Transfer of Care (Signed)
Immediate Anesthesia Transfer of Care Note  Patient: Terry Koch  Procedure(s) Performed: CARDIOVERSION (N/A )  Patient Location: Endoscopy Unit  Anesthesia Type:General  Level of Consciousness: awake, alert  and oriented  Airway & Oxygen Therapy: Patient Spontanous Breathing  Post-op Assessment: Report given to RN and Post -op Vital signs reviewed and stable  Post vital signs: Reviewed and stable  Last Vitals:  Vitals Value Taken Time  BP    Temp    Pulse    Resp    SpO2      Last Pain:  Vitals:   02/06/20 1014  TempSrc: Oral  PainSc: 0-No pain         Complications: No complications documented.

## 2020-02-06 NOTE — Progress Notes (Signed)
Primary Physician/Referring:  Lauree Chandler, NP  Patient ID: Terry Koch, female    DOB: [birth year], 74 y.o.   MRN: [id number]  No chief complaint on file.  HPI:    Terry Koch  is a 74 y.o. African-American female with hypertension, hyperlipidemia, obesity, colonic polyps requiring serial surveillance colonoscopy, no prior history of GI bleed.  Referred to our office for new onset atrial fibrillation during colonoscopy.  She also complained of dyspnea on exertion, fatigue, and decreased exercise tolerance.  Cardioversion 12/25/2019 was unsuccessful.  Continues to have decreased exercise tolerance.  She is now on Amiodarone and tolerating this well. Still has occasional palpitations. No change in dyspnea on exertion and reduced exercise tolerance.   Past Medical History:  Diagnosis Date  . Arthritis    self report  . Bone fracture    Right Foot  . H/O hernia repair   . History of hysterectomy   . History of mammogram    Dr.Elizabeth Gwenlyn Saran  . History of MRI    Dr.Elizabeth Gwenlyn Saran.  Marland Kitchen History of removal of skin mole 03/09/1997  . Hyperlipidemia   . Hypertension   . Scalp hematoma   . Wears glasses    Past Surgical History:  Procedure Laterality Date  . ABDOMINAL HYSTERECTOMY    . BREAST BIOPSY  03/09/1997   Dr.Finder  . CARDIOVERSION N/A 12/25/2019   Procedure: CARDIOVERSION;  Surgeon: Adrian Prows, MD;  Location: [facility];  Service: Cardiovascular;  Laterality: N/A;  . CHOLECYSTECTOMY     ~ 6 yrs ago  . COLONOSCOPY  2009   Dr.Chesley  . CYSTECTOMY  09/04/2019   Dr.Gray  . GALLBLADDER SURGERY  03/10/2015   Dr.Amid [facility] )  . POLYPECTOMY     12 polyps removed at [place]  2016   Family History  Problem Relation Age of Onset  . Breast cancer Mother   . Alzheimer's disease Mother   . Dementia Mother   . Parkinson's disease Mother   . Colon polyps Mother   . Stroke Father   . Dementia Father   . Breast cancer  Sister   . Allergies Daughter   . Allergies Son   . Breast cancer Maternal Aunt   . Allergies Daughter   . Colon cancer Neg Hx   . Esophageal cancer Neg Hx   . Stomach cancer Neg Hx   . Rectal cancer Neg Hx     Social History   Tobacco Use  . Smoking status: Never Smoker  . Smokeless tobacco: Never Used  Substance Use Topics  . Alcohol use: Yes    Alcohol/week: 1.0 standard drink    Types: 1 Glasses of wine per week    Comment: occa   Marital Status: Married  ROS  Review of Systems  Constitutional: Positive for malaise/fatigue.  Cardiovascular: Positive for dyspnea on exertion. Negative for chest pain and leg swelling.  Gastrointestinal: Negative for melena.   Objective  Blood pressure (!) 159/89, pulse 77, temperature 98.1 F (36.7 C), temperature source Oral, resp. rate (!) 25, height 5\' 7"  (1.702 m), weight 122.5 kg, SpO2 97 %.  Vitals with BMI 02/06/2020 01/02/2020 12/25/2019  Height 5\' 7"  5\' 7"  -  Weight 270 lbs 278 lbs -  BMI 54.49 20.10 -  Systolic 071 219 758  Diastolic 89 90 85  Pulse 77 85 66     Physical Exam Constitutional:      Comments: Morbidly obese in no acute distress.  Cardiovascular:     Rate and Rhythm: Normal rate. Rhythm irregular.     Pulses:          Carotid pulses are 2+ on the right side and 2+ on the left side.      Dorsalis pedis pulses are 2+ on the right side and 2+ on the left side.       Posterior tibial pulses are 2+ on the right side and 2+ on the left side.     Heart sounds: Normal heart sounds. No murmur heard.  No gallop.      Comments: Femoral and popliteal pulse difficult to feel due to patient's body habitus.  No leg edema. JVD difficult to see due to short neck. Pulmonary:     Effort: Pulmonary effort is normal.     Breath sounds: Normal breath sounds.  Abdominal:     General: Bowel sounds are normal.     Palpations: Abdomen is soft.     Comments: Obese. Pannus present    Laboratory examination:   Recent Labs     10/04/19 1617 12/07/19 1424 02/06/20 1038  NA 142 141 141  K 3.7 4.0 3.5  CL 105 104 104  CO2 28 29  --   GLUCOSE 91 96 104*  BUN 9 13 9   CREATININE 0.94* 0.98* 0.90  CALCIUM 9.1 9.7  --   GFRNONAA 60 57*  --   GFRAA 69 66  --    estimated creatinine clearance is 74.5 mL/min (by C-G formula based on SCr of 0.9 mg/dL).  CMP Latest Ref Rng & Units 02/06/2020 12/07/2019 10/04/2019  Glucose 70 - 99 mg/dL 104(H) 96 91  BUN 8 - 23 mg/dL 9 13 9   Creatinine 0.44 - 1.00 mg/dL 0.90 0.98(H) 0.94(H)  Sodium 135 - 145 mmol/L 141 141 142  Potassium 3.5 - 5.1 mmol/L 3.5 4.0 3.7  Chloride 98 - 111 mmol/L 104 104 105  CO2 20 - 32 mmol/L - 29 28  Calcium 8.6 - 10.4 mg/dL - 9.7 9.1  Total Protein 6.1 - 8.1 g/dL - 6.8 -  Total Bilirubin 0.2 - 1.2 mg/dL - 0.7 -  AST 10 - 35 U/L - 41(H) -  ALT 6 - 29 U/L - 43(H) -   CBC Latest Ref Rng & Units 02/06/2020 12/07/2019 10/04/2019  WBC 3.8 - 10.8 Thousand/uL - 5.7 6.8  Hemoglobin 12.0 - 15.0 g/dL 13.6 13.0 12.7  Hematocrit 36 - 46 % 40.0 40.5 40.6  Platelets 140 - 400 Thousand/uL - 215 211    Lipid Panel Recent Labs    12/07/19 1424  CHOL 163  TRIG 123  LDLCALC 94  HDL 47*  CHOLHDL 3.5    HEMOGLOBIN A1C No results found for: HGBA1C, MPG TSH Recent Labs    10/04/19 1617  TSH 0.95   Medications and allergies  No Known Allergies   (Not in an outpatient encounter)    Radiology:   No results found.  Cardiac Studies:   Exercise Myoview stress test 10/18/2019: Exercise nuclear stress test was performed using Bruce protocol. Patient reached 5.1 METS, and 98% of age predicted maximum heart rate. Exercise capacity was low. No chest pain reported. Heart rate and hemodynamic response were normal. Stress EKG revealed no ischemic changes. Normal myocardial perfusion. Stress LVEF calculated 49%, although visually appears normal. Low risk study.  Echocardiogram 10/25/2019:  Left ventricle cavity is normal in size. Mild concentric hypertrophy  of  the left ventricle. Normal global wall motion. Normal LV systolic  function with EF 55%. Doppler evidence of grade II (pseudonormal) diastolic dysfunction.  Left atrial cavity is severely dilated. Integrity of interatrial septum could not be adequately assessed to limited visualization. Consider  alternate imaging study, if clinically indicated.  Trileaflet aortic valve. Trace aortic regurgitation.  Mild tricuspid regurgitation. No evidence of pulmonary hypertension.  Cardioversion 12/25/2019:  Indication symptomatic A. Fibrillation. Procedure: Using 90 mg of IV Propofol and 100 IV Lidocaine (for reducing venous pain) for achieving deep sedation, synchronized direct current cardioversion performed. Patient was delivered with 150 x 1 then 200 Joules of electricity X 3 without success. Patient tolerated the procedure well. No immediate complication noted.     EKG:   EKG 01/02/2020: Atrial fibrillation with controlled ventricular response at the rate of 74 bpm, leftward axis, poor R wave progression, cannot exclude anteroseptal infarct old.  Nonspecific T abnormality.  No significant change from 11/23/2019.  Assessment   Persistent atrial fibrillation.  CHA2DS2-VASc Score is 3.  Yearly risk of stroke: 3.2% (A, F, HTN).  Score of 1=0.6; 2=2.2; 3=3.2; 4=4.8; 5=7.2; 6=9.8; 7=>9.8) -(CHF; HTN; vasc disease DM,  Female = 1; Age <65 =0; 65-74 = 1,  >75 =2; stroke/embolism= 2).   Recommendations:   Terry Koch is a 74 y.o. African-American female with hypertension, hyperlipidemia, obesity, colonic polyps requiring serial surveillance colonoscopy, no prior history of GI bleed.  Referred to our office for new onset atrial fibrillation during colonoscopy.  She also complained of dyspnea on exertion, fatigue, and decreased exercise tolerance.  Cardioversion 12/25/2019 was unsuccessful. (Patient presents for 1 week follow-up of direct-current cardioversion 12/25/2019 for persistent atrial  fibrillation.  Patient was delivered 150x1 and 200 J of electricity x3 without success.)  She is well anticoagulated and tolerating Amiodarone and will proceed with cardioversion. All questions answered.   Adrian Prows, MD, [facility] 02/06/2020, 10:49 AM Office: [phone] Pager: [phone]

## 2020-02-06 NOTE — H&P (View-Only) (Signed)
Primary Physician/Referring:  Lauree Chandler, NP  Patient ID: Terry Koch, female    DOB: [birth year], 74 y.o.   MRN: [id number]  No chief complaint on file.  HPI:    Terry Koch  is a 74 y.o. African-American female with hypertension, hyperlipidemia, obesity, colonic polyps requiring serial surveillance colonoscopy, no prior history of GI bleed.  Referred to our office for new onset atrial fibrillation during colonoscopy.  She also complained of dyspnea on exertion, fatigue, and decreased exercise tolerance.  Cardioversion 12/25/2019 was unsuccessful.  Continues to have decreased exercise tolerance.  She is now on Amiodarone and tolerating this well. Still has occasional palpitations. No change in dyspnea on exertion and reduced exercise tolerance.   Past Medical History:  Diagnosis Date  . Arthritis    self report  . Bone fracture    Right Foot  . H/O hernia repair   . History of hysterectomy   . History of mammogram    Dr.Elizabeth Gwenlyn Saran  . History of MRI    Dr.Elizabeth Gwenlyn Saran.  Marland Kitchen History of removal of skin mole 03/09/1997  . Hyperlipidemia   . Hypertension   . Scalp hematoma   . Wears glasses    Past Surgical History:  Procedure Laterality Date  . ABDOMINAL HYSTERECTOMY    . BREAST BIOPSY  03/09/1997   Dr.Finder  . CARDIOVERSION N/A 12/25/2019   Procedure: CARDIOVERSION;  Surgeon: Adrian Prows, MD;  Location: [place];  Service: Cardiovascular;  Laterality: N/A;  . CHOLECYSTECTOMY     ~ 6 yrs ago  . COLONOSCOPY  2009   Dr.Chesley  . CYSTECTOMY  09/04/2019   Dr.Gray  . GALLBLADDER SURGERY  03/10/2015   Dr.Amid [facility] )  . POLYPECTOMY     12 polyps removed at [facility]  2016   Family History  Problem Relation Age of Onset  . Breast cancer Mother   . Alzheimer's disease Mother   . Dementia Mother   . Parkinson's disease Mother   . Colon polyps Mother   . Stroke Father   . Dementia Father   . Breast cancer  Sister   . Allergies Daughter   . Allergies Son   . Breast cancer Maternal Aunt   . Allergies Daughter   . Colon cancer Neg Hx   . Esophageal cancer Neg Hx   . Stomach cancer Neg Hx   . Rectal cancer Neg Hx     Social History   Tobacco Use  . Smoking status: Never Smoker  . Smokeless tobacco: Never Used  Substance Use Topics  . Alcohol use: Yes    Alcohol/week: 1.0 standard drink    Types: 1 Glasses of wine per week    Comment: occa   Marital Status: Married  ROS  Review of Systems  Constitutional: Positive for malaise/fatigue.  Cardiovascular: Positive for dyspnea on exertion. Negative for chest pain and leg swelling.  Gastrointestinal: Negative for melena.   Objective  Blood pressure (!) 159/89, pulse 77, temperature 98.1 F (36.7 C), temperature source Oral, resp. rate (!) 25, height 5\' 7"  (1.702 m), weight 122.5 kg, SpO2 97 %.  Vitals with BMI 02/06/2020 01/02/2020 12/25/2019  Height 5\' 7"  5\' 7"  -  Weight 270 lbs 278 lbs -  BMI 52.84 13.24 -  Systolic 401 027 253  Diastolic 89 90 85  Pulse 77 85 66     Physical Exam Constitutional:      Comments: Morbidly obese in no acute distress.  Cardiovascular:     Rate and Rhythm: Normal rate. Rhythm irregular.     Pulses:          Carotid pulses are 2+ on the right side and 2+ on the left side.      Dorsalis pedis pulses are 2+ on the right side and 2+ on the left side.       Posterior tibial pulses are 2+ on the right side and 2+ on the left side.     Heart sounds: Normal heart sounds. No murmur heard.  No gallop.      Comments: Femoral and popliteal pulse difficult to feel due to patient's body habitus.  No leg edema. JVD difficult to see due to short neck. Pulmonary:     Effort: Pulmonary effort is normal.     Breath sounds: Normal breath sounds.  Abdominal:     General: Bowel sounds are normal.     Palpations: Abdomen is soft.     Comments: Obese. Pannus present    Laboratory examination:   Recent Labs     10/04/19 1617 12/07/19 1424 02/06/20 1038  NA 142 141 141  K 3.7 4.0 3.5  CL 105 104 104  CO2 28 29  --   GLUCOSE 91 96 104*  BUN 9 13 9   CREATININE 0.94* 0.98* 0.90  CALCIUM 9.1 9.7  --   GFRNONAA 60 57*  --   GFRAA 69 66  --    estimated creatinine clearance is 74.5 mL/min (by C-G formula based on SCr of 0.9 mg/dL).  CMP Latest Ref Rng & Units 02/06/2020 12/07/2019 10/04/2019  Glucose 70 - 99 mg/dL 104(H) 96 91  BUN 8 - 23 mg/dL 9 13 9   Creatinine 0.44 - 1.00 mg/dL 0.90 0.98(H) 0.94(H)  Sodium 135 - 145 mmol/L 141 141 142  Potassium 3.5 - 5.1 mmol/L 3.5 4.0 3.7  Chloride 98 - 111 mmol/L 104 104 105  CO2 20 - 32 mmol/L - 29 28  Calcium 8.6 - 10.4 mg/dL - 9.7 9.1  Total Protein 6.1 - 8.1 g/dL - 6.8 -  Total Bilirubin 0.2 - 1.2 mg/dL - 0.7 -  AST 10 - 35 U/L - 41(H) -  ALT 6 - 29 U/L - 43(H) -   CBC Latest Ref Rng & Units 02/06/2020 12/07/2019 10/04/2019  WBC 3.8 - 10.8 Thousand/uL - 5.7 6.8  Hemoglobin 12.0 - 15.0 g/dL 13.6 13.0 12.7  Hematocrit 36 - 46 % 40.0 40.5 40.6  Platelets 140 - 400 Thousand/uL - 215 211    Lipid Panel Recent Labs    12/07/19 1424  CHOL 163  TRIG 123  LDLCALC 94  HDL 47*  CHOLHDL 3.5    HEMOGLOBIN A1C No results found for: HGBA1C, MPG TSH Recent Labs    10/04/19 1617  TSH 0.95   Medications and allergies  No Known Allergies   (Not in an outpatient encounter)    Radiology:   No results found.  Cardiac Studies:   Exercise Myoview stress test 10/18/2019: Exercise nuclear stress test was performed using Bruce protocol. Patient reached 5.1 METS, and 98% of age predicted maximum heart rate. Exercise capacity was low. No chest pain reported. Heart rate and hemodynamic response were normal. Stress EKG revealed no ischemic changes. Normal myocardial perfusion. Stress LVEF calculated 49%, although visually appears normal. Low risk study.  Echocardiogram 10/25/2019:  Left ventricle cavity is normal in size. Mild concentric hypertrophy  of  the left ventricle. Normal global wall motion. Normal LV systolic  function with EF 55%. Doppler evidence of grade II (pseudonormal) diastolic dysfunction.  Left atrial cavity is severely dilated. Integrity of interatrial septum could not be adequately assessed to limited visualization. Consider  alternate imaging study, if clinically indicated.  Trileaflet aortic valve. Trace aortic regurgitation.  Mild tricuspid regurgitation. No evidence of pulmonary hypertension.  Cardioversion 12/25/2019:  Indication symptomatic A. Fibrillation. Procedure: Using 90 mg of IV Propofol and 100 IV Lidocaine (for reducing venous pain) for achieving deep sedation, synchronized direct current cardioversion performed. Patient was delivered with 150 x 1 then 200 Joules of electricity X 3 without success. Patient tolerated the procedure well. No immediate complication noted.     EKG:   EKG 01/02/2020: Atrial fibrillation with controlled ventricular response at the rate of 74 bpm, leftward axis, poor R wave progression, cannot exclude anteroseptal infarct old.  Nonspecific T abnormality.  No significant change from 11/23/2019.  Assessment   Persistent atrial fibrillation.  CHA2DS2-VASc Score is 3.  Yearly risk of stroke: 3.2% (A, F, HTN).  Score of 1=0.6; 2=2.2; 3=3.2; 4=4.8; 5=7.2; 6=9.8; 7=>9.8) -(CHF; HTN; vasc disease DM,  Female = 1; Age <65 =0; 65-74 = 1,  >75 =2; stroke/embolism= 2).   Recommendations:   Terry Koch is a 74 y.o. African-American female with hypertension, hyperlipidemia, obesity, colonic polyps requiring serial surveillance colonoscopy, no prior history of GI bleed.  Referred to our office for new onset atrial fibrillation during colonoscopy.  She also complained of dyspnea on exertion, fatigue, and decreased exercise tolerance.  Cardioversion 12/25/2019 was unsuccessful. (Patient presents for 1 week follow-up of direct-current cardioversion 12/25/2019 for persistent atrial  fibrillation.  Patient was delivered 150x1 and 200 J of electricity x3 without success.)  She is well anticoagulated and tolerating Amiodarone and will proceed with cardioversion. All questions answered.   Adrian Prows, MD, [facility] 02/06/2020, 10:49 AM Office: [phone] Pager: [phone]

## 2020-02-06 NOTE — Anesthesia Postprocedure Evaluation (Signed)
Anesthesia Post Note  Patient: Oncologist  Procedure(s) Performed: CARDIOVERSION (N/A )     Patient location during evaluation: PACU Anesthesia Type: General Level of consciousness: awake and alert Pain management: pain level controlled Vital Signs Assessment: post-procedure vital signs reviewed and stable Respiratory status: spontaneous breathing, nonlabored ventilation, respiratory function stable and patient connected to nasal cannula oxygen Cardiovascular status: blood pressure returned to baseline and stable Postop Assessment: no apparent nausea or vomiting Anesthetic complications: no   No complications documented.  Last Vitals:  Vitals:   02/06/20 1120 02/06/20 1127  BP: 132/82 138/69  Pulse: (!) 56 (!) 52  Resp: 18 18  Temp:    SpO2:  98%    Last Pain:  Vitals:   02/06/20 1120  TempSrc:   PainSc: 0-No pain                 Terry Koch COKER

## 2020-02-06 NOTE — CV Procedure (Signed)
Indication: Symptomatic persistent atrial fibrillasiton  Direct current cardioversion 02/06/20 :  Indication symptomatic A. Fibrillation.  Procedure: Using 100 mg of IV Propofol and 40 IV Lidocaine (for reducing venous pain) for achieving deep sedation, synchronized direct current cardioversion performed. Patient was delivered with 200J x 1 then 350 Joules of electricity X 1 with success to NSR. Patient tolerated the procedure well. No immediate complication noted.     Adrian Prows, MD, [facility] 02/06/2020, 11:06 AM Office: [phone] Pager: [phone]

## 2020-02-06 NOTE — Interval H&P Note (Signed)
History and Physical Interval Note:  02/06/2020 10:53 AM  Terry Koch  has presented today for surgery, with the diagnosis of PERSISTENT  A-FIB.  The various methods of treatment have been discussed with the patient and family. After consideration of risks, benefits and other options for treatment, the patient has consented to  Procedure(s): CARDIOVERSION (N/A) as a surgical intervention.  The patient's history has been reviewed, patient examined, no change in status, stable for surgery.  I have reviewed the patient's chart and labs.  Questions were answered to the patient's satisfaction.     Adrian Prows

## 2020-02-06 NOTE — Discharge Instructions (Signed)
Electrical Cardioversion Electrical cardioversion is the delivery of a jolt of electricity to restore a normal rhythm to the heart. A rhythm that is too fast or is not regular keeps the heart from pumping well. In this procedure, sticky patches or metal paddles are placed on the chest to deliver electricity to the heart from a device. This procedure may be done in an emergency if:  There is low or no blood pressure as a result of the heart rhythm.  Normal rhythm must be restored as fast as possible to protect the brain and heart from further damage.  It may save a life. This may also be a scheduled procedure for irregular or fast heart rhythms that are not immediately life-threatening. Follow these instructions at home:  Do not drive for 24 hours if you were given a sedative during your procedure.  Take over-the-counter and prescription medicines only as told by your health care provider.  Ask your health care provider how to check your pulse. Check it often.  Rest for 48 hours after the procedure or as told by your health care provider.  Avoid or limit your caffeine use as told by your health care provider.  Keep all follow-up visits as told by your health care provider. This is important. Contact a health care provider if:  You feel like your heart is beating too quickly or your pulse is not regular.  You have a serious muscle cramp that does not go away. Get help right away if:  You have discomfort in your chest.  You are dizzy or you feel faint.  You have trouble breathing or you are short of breath.  Your speech is slurred.  You have trouble moving an arm or leg on one side of your body.  Your fingers or toes turn cold or blue. Summary  Electrical cardioversion is the delivery of a jolt of electricity to restore a normal rhythm to the heart.  This procedure may be done right away in an emergency or may be a scheduled procedure if the condition is not an  emergency.  Generally, this is a safe procedure.  After the procedure, check your pulse often as told by your health care provider. This information is not intended to replace advice given to you by your health care provider. Make sure you discuss any questions you have with your health care provider. Document Revised: 09/26/2018 Document Reviewed: 09/26/2018 Elsevier Patient Education  2020 [employer]  

## 2020-02-06 NOTE — Anesthesia Procedure Notes (Addendum)
Procedure Name: General with mask airway Performed by: Valda Favia, CRNA Pre-anesthesia Checklist: Patient identified, Emergency Drugs available, Suction available, Patient being monitored and Timeout performed Patient Re-evaluated:Patient Re-evaluated prior to induction Oxygen Delivery Method: Ambu bag Preoxygenation: Pre-oxygenation with 100% oxygen Induction Type: IV induction Ventilation: Oral airway inserted - appropriate to patient size and Mask ventilation without difficulty Placement Confirmation: positive ETCO2 Dental Injury: Teeth and Oropharynx as per pre-operative assessment

## 2020-02-06 NOTE — Anesthesia Preprocedure Evaluation (Signed)
Anesthesia Evaluation  Patient identified by MRN, date of birth, ID band Patient awake    Reviewed: Allergy & Precautions, NPO status , Patient's Chart, lab work & pertinent test results  Airway Mallampati: II  TM Distance: >3 FB Neck ROM: Full    Dental  (+) Teeth Intact, Dental Advisory Given   Pulmonary    breath sounds clear to auscultation       Cardiovascular hypertension,  Rhythm:Irregular Rate:Normal     Neuro/Psych    GI/Hepatic   Endo/Other    Renal/GU      Musculoskeletal   Abdominal   Peds  Hematology   Anesthesia Other Findings   Reproductive/Obstetrics                             Anesthesia Physical Anesthesia Plan  ASA: III  Anesthesia Plan: General   Post-op Pain Management:    Induction: Intravenous  PONV Risk Score and Plan:   Airway Management Planned: Mask  Additional Equipment:   Intra-op Plan:   Post-operative Plan:   Informed Consent: I have reviewed the patients History and Physical, chart, labs and discussed the procedure including the risks, benefits and alternatives for the proposed anesthesia with the patient or authorized representative who has indicated his/her understanding and acceptance.       Plan Discussed with: Anesthesiologist and CRNA  Anesthesia Plan Comments:         Anesthesia Quick Evaluation

## 2020-02-07 ENCOUNTER — Telehealth: Payer: Self-pay | Admitting: Cardiology

## 2020-02-07 NOTE — Telephone Encounter (Signed)
Patient called today stating that when she got home from Cardioversion on 02/06/20 she felt very tired and just wanted to sleep. Does not know if she had a reaction to anesthesia  Her joints were achy and during the night felt pressure on right side for 5-10 minutes.  She did not want to go to ER at that time as did not feel that bad.  She did prop her body with a pillow and felt better.    Today she has felt better, has been able to eat breakfast, drink water, tea and apple cider.  She has had no problems going to the bathroom.  Is taking medication as prescribed.  [facility] did not come to her house today, hopes they will be there tomorrow.    Does not know if symptoms are due to  this being her 2nd cardioversion and was scared, nervous and anxious.  She is grateful for the care she received yesterday and that the cardioversion worked.  She is better and wanted you to know how she felt due to anesthesia

## 2020-02-07 NOTE — Progress Notes (Signed)
EKG 02/06/2020: Sinus bradycardia with first-degree AV block at rate of 58 bpm, left atrial enlargement, poor R wave progression, cannot exclude anteroseptal infarct old.  Nonspecific T abnormality.  Normal QT interval.

## 2020-02-07 NOTE — Telephone Encounter (Signed)
I had to use higher Joules, (electricity) to convert and could have caused chest pain. She should start feeling better soon. If still problems, I can see her soon

## 2020-02-08 NOTE — Telephone Encounter (Signed)
That is good enough

## 2020-02-08 NOTE — Telephone Encounter (Signed)
Pt has appointment with you 12/14, should this be moved up ?

## 2020-02-20 ENCOUNTER — Ambulatory Visit: Payer: Medicare Other | Admitting: Cardiology

## 2020-02-27 NOTE — Progress Notes (Signed)
Primary Physician/Referring:  Lauree Chandler, NP  Patient ID: Terry Koch, female    DOB: 1945/07/13, 74 y.o.   MRN: [id number]  Chief Complaint  Patient presents with  . Atrial Fibrillation   HPI:    Terry Koch  is a 74 y.o. African-American female with hypertension, hyperlipidemia, obesity, colonic polyps requiring serial surveillance colonoscopy, no prior history of GI bleed.  Referred to our office for new onset atrial fibrillation during colonoscopy.  She also complained of dyspnea on exertion, fatigue, and decreased exercise tolerance.  Cardioversion 12/25/2019 was unsuccessful.  Underwent repeat cardioversion successful to normal sinus rhythm 02/06/2020.  Patient presents for 4-week follow-up of atrial fibrillation following cardioversion on 02/06/2020.  Patient is presently feeling well.  Denies chest pain, dyspnea, palpitations, fatigue, dizziness, syncope, near syncope.  She does report that the night following her procedure she had some chest and right hip discomfort which have since resolved.  She does note that her home blood pressure readings have been elevated above goal.  Continues to tolerate Eliquis without bleeding diathesis.  Past Medical History:  Diagnosis Date  . Arthritis    self report  . Bone fracture    Right Foot  . H/O hernia repair   . History of hysterectomy   . History of mammogram    Dr.Elizabeth Gwenlyn Saran  . History of MRI    Dr.Elizabeth Gwenlyn Saran.  Marland Kitchen History of removal of skin mole 03/09/1997  . Hyperlipidemia   . Hypertension   . Scalp hematoma   . Wears glasses    Past Surgical History:  Procedure Laterality Date  . ABDOMINAL HYSTERECTOMY    . BREAST BIOPSY  03/09/1997   Dr.Finder  . CARDIOVERSION N/A 12/25/2019   Procedure: CARDIOVERSION;  Surgeon: Adrian Prows, MD;  Location[facility] ENDOSCOPY;  Service: Cardiovascular;  Laterality: N/A;  . CARDIOVERSION N/A 02/06/2020   Procedure: CARDIOVERSION;  Surgeon: Adrian Prows, MD;   Location: [place];  Service: Cardiovascular;  Laterality: N/A;  . CHOLECYSTECTOMY     ~ 6 yrs ago  . COLONOSCOPY  2009   Dr.Chesley  . CYSTECTOMY  09/04/2019   Dr.Gray  . GALLBLADDER SURGERY  03/10/2015   Dr.Amid [facility] )  . POLYPECTOMY     12 polyps removed at [place]  2016   Family History  Problem Relation Age of Onset  . Breast cancer Mother   . Alzheimer's disease Mother   . Dementia Mother   . Parkinson's disease Mother   . Colon polyps Mother   . Stroke Father   . Dementia Father   . Breast cancer Sister   . Allergies Daughter   . Allergies Son   . Breast cancer Maternal Aunt   . Allergies Daughter   . Colon cancer Neg Hx   . Esophageal cancer Neg Hx   . Stomach cancer Neg Hx   . Rectal cancer Neg Hx     Social History   Tobacco Use  . Smoking status: Never Smoker  . Smokeless tobacco: Never Used  Substance Use Topics  . Alcohol use: Yes    Alcohol/week: 1.0 standard drink    Types: 1 Glasses of wine per week    Comment: occa   Marital Status: Married  ROS  Review of Systems  Constitutional: Negative for malaise/fatigue and weight gain.  Cardiovascular: Negative for chest pain, claudication, dyspnea on exertion, leg swelling, near-syncope, orthopnea, palpitations, paroxysmal nocturnal dyspnea and syncope.  Respiratory: Negative for shortness of  breath.   Hematologic/Lymphatic: Does not bruise/bleed easily.  Gastrointestinal: Negative for melena.  Neurological: Negative for dizziness and weakness.   Objective  Blood pressure (!) 145/86, pulse 67, height 5\' 7"  (1.702 m), weight 276 lb (125.2 kg), SpO2 98 %.  Vitals with BMI 02/29/2020 02/06/2020 02/06/2020  Height 5\' 7"  - -  Weight 276 lbs - -  BMI 123456 - -  Systolic Q000111Q 0000000 Q000111Q  Diastolic 86 69 82  Pulse 67 52 56     Physical Exam Vitals reviewed.  Constitutional:      Comments: Morbidly obese in no acute distress.  HENT:     Head: Normocephalic and  atraumatic.  Cardiovascular:     Rate and Rhythm: Normal rate and regular rhythm.     Pulses: Intact distal pulses.          Carotid pulses are 2+ on the right side and 2+ on the left side.      Dorsalis pedis pulses are 2+ on the right side and 2+ on the left side.       Posterior tibial pulses are 2+ on the right side and 2+ on the left side.     Heart sounds: Normal heart sounds, S1 normal and S2 normal. No murmur heard. No gallop.      Comments: Femoral and popliteal pulse difficult to feel due to patient's body habitus.  No leg edema. JVD difficult to see due to short neck. Pulmonary:     Effort: Pulmonary effort is normal. No respiratory distress.     Breath sounds: Normal breath sounds. No wheezing, rhonchi or rales.  Abdominal:     General: Bowel sounds are normal.     Palpations: Abdomen is soft.     Comments: Obese. Pannus present  Musculoskeletal:     Right lower leg: No edema.     Left lower leg: No edema.  Neurological:     Mental Status: She is alert.    Laboratory examination:   Recent Labs    10/04/19 1617 12/07/19 1424 02/06/20 1038  NA 142 141 141  K 3.7 4.0 3.5  CL 105 104 104  CO2 28 29  --   GLUCOSE 91 96 104*  BUN 9 13 9   CREATININE 0.94* 0.98* 0.90  CALCIUM 9.1 9.7  --   GFRNONAA 60 57*  --   GFRAA 69 66  --    CrCl cannot be calculated (Patient's most recent lab result is older than the maximum 21 days allowed.).  CMP Latest Ref Rng & Units 02/06/2020 12/07/2019 10/04/2019  Glucose 70 - 99 mg/dL 104(H) 96 91  BUN 8 - 23 mg/dL 9 13 9   Creatinine 0.44 - 1.00 mg/dL 0.90 0.98(H) 0.94(H)  Sodium 135 - 145 mmol/L 141 141 142  Potassium 3.5 - 5.1 mmol/L 3.5 4.0 3.7  Chloride 98 - 111 mmol/L 104 104 105  CO2 20 - 32 mmol/L - 29 28  Calcium 8.6 - 10.4 mg/dL - 9.7 9.1  Total Protein 6.1 - 8.1 g/dL - 6.8 -  Total Bilirubin 0.2 - 1.2 mg/dL - 0.7 -  AST 10 - 35 U/L - 41(H) -  ALT 6 - 29 U/L - 43(H) -   CBC Latest Ref Rng & Units 02/06/2020 12/07/2019  10/04/2019  WBC 3.8 - 10.8 Thousand/uL - 5.7 6.8  Hemoglobin 12.0 - 15.0 g/dL 13.6 13.0 12.7  Hematocrit 36.0 - 46.0 % 40.0 40.5 40.6  Platelets 140 - 400 Thousand/uL - 215 211  Lipid Panel Recent Labs    12/07/19 1424  CHOL 163  TRIG 123  LDLCALC 94  HDL 47*  CHOLHDL 3.5    HEMOGLOBIN A1C No results found for: HGBA1C, MPG TSH Recent Labs    10/04/19 1617  TSH 0.95   Medications and allergies  No Known Allergies   Outpatient Medications Prior to Visit  Medication Sig Dispense Refill  . amiodarone (PACERONE) 200 MG tablet Take 1 tablet (200 mg total) by mouth daily. 60 tablet 2  . apixaban (ELIQUIS) 5 MG TABS tablet Take 1 tablet (5 mg total) by mouth 2 (two) times daily. 180 tablet 1  . Cholecalciferol (VITAMIN D3) 25 MCG (1000 UT) CAPS Take 1,000 Units by mouth daily.     Marland Kitchen CRANBERRY PO Take 1 tablet by mouth every other day. [employer]    . ELDERBERRY PO Take 50 mg by mouth daily.    . metoprolol succinate (TOPROL-XL) 50 MG 24 hr tablet TAKE 1 TABLET(50 MG) BY MOUTH DAILY (Patient taking differently: Take 50 mg by mouth daily.) 90 tablet 0  . Multiple Vitamins-Minerals (COMPLETE MULTIVITAMIN/MINERAL PO) Take 1 tablet by mouth daily. [employer] Vitamin    . OMEGA-3 KRILL OIL PO Take 350 mg by mouth daily.     . Psyllium (METAMUCIL FIBER PO) Take 10 mLs by mouth 3 (three) times a week. With water or Juice    . rosuvastatin (CRESTOR) 5 MG tablet Take 5 mg by mouth at bedtime.    . valsartan-hydrochlorothiazide (DIOVAN-HCT) 160-12.5 MG tablet Take 1 tablet by mouth daily.    Marland Kitchen amLODipine (NORVASC) 5 MG tablet Take 1 tablet (5 mg total) by mouth daily. (Patient taking differently: Take 5 mg by mouth at bedtime.) 30 tablet 2   No facility-administered medications prior to visit.     Radiology:   No results found.  Cardiac Studies:   Exercise Myoview stress test 10/18/2019: Exercise nuclear stress test was performed using Bruce protocol. Patient reached 5.1  METS, and 98% of age predicted maximum heart rate. Exercise capacity was low. No chest pain reported. Heart rate and hemodynamic response were normal. Stress EKG revealed no ischemic changes. Normal myocardial perfusion. Stress LVEF calculated 49%, although visually appears normal. Low risk study.  Echocardiogram 10/25/2019:  Left ventricle cavity is normal in size. Mild concentric hypertrophy of  the left ventricle. Normal global wall motion. Normal LV systolic function with EF 55%. Doppler evidence of grade II (pseudonormal) diastolic dysfunction.  Left atrial cavity is severely dilated. Integrity of interatrial septum could not be adequately assessed to limited visualization. Consider  alternate imaging study, if clinically indicated.  Trileaflet aortic valve. Trace aortic regurgitation.  Mild tricuspid regurgitation. No evidence of pulmonary hypertension.  Cardioversion 12/25/2019:  Indication symptomatic A. Fibrillation. Procedure: Using 90 mg of IV Propofol and 100 IV Lidocaine (for reducing venous pain) for achieving deep sedation, synchronized direct current cardioversion performed. Patient was delivered with 150 x 1 then 200 Joules of electricity X 3 without success. Patient tolerated the procedure well. No immediate complication noted.    Direct current cardioversion 02/06/20 : Indication symptomatic A. Fibrillation. Procedure: Using 100 mg of IV Propofol and 40 IV Lidocaine (for reducing venous pain) for achieving deep sedation, synchronized direct current cardioversion performed. Patient was delivered with 200J x 1 then 350 Joules of electricity X 1 with success to NSR. Patient tolerated the procedure well. No immediate complication noted.    EKG:   EKG 02/29/2020: Normal sinus rhythm with borderline first  degree and rate variation, aproximately 60 bpm.  Left atrial enlargement.  Poor progression, cannot exclude anteroseptal infarct old.  Nonspecific T wave abnormality.  Compared to  EKG 02/06/2020, no significant change.   EKG 02/06/2020: Sinus bradycardia with first-degree AV block at rate of 58 bpm, left atrial enlargement, poor R wave progression, cannot exclude anteroseptal infarct old.  Nonspecific T abnormality.  Normal QT interval.  EKG 01/02/2020: Atrial fibrillation with controlled ventricular response at the rate of 74 bpm, leftward axis, poor R wave progression, cannot exclude anteroseptal infarct old.  Nonspecific T abnormality.  No significant change from 11/23/2019.  Assessment     ICD-10-CM   1. Persistent atrial fibrillation (HCC)  I48.19 EKG 12-Lead  2. Primary hypertension  I10 amLODipine (NORVASC) 5 MG tablet  3. Class 3 severe obesity due to excess calories with serious comorbidity and body mass index (BMI) of 40.0 to 44.9 in adult (HCC)  E66.01    Z68.41      CHA2DS2-VASc Score is 3.  Yearly risk of stroke: 3.2% (A, F, HTN).   Medications Discontinued During This Encounter  Medication Reason  . amLODipine (NORVASC) 5 MG tablet     Meds ordered this encounter  Medications  . amLODipine (NORVASC) 5 MG tablet    Sig: Take 2 tablets (10 mg total) by mouth daily.    Dispense:  60 tablet    Refill:  2    Recommendations:   Terry Koch is a 74 y.o. African-American female with hypertension, hyperlipidemia, obesity, colonic polyps requiring serial surveillance colonoscopy, no prior history of GI bleed.  Referred to our office for new onset atrial fibrillation during colonoscopy.  She also complained of dyspnea on exertion, fatigue, and decreased exercise tolerance.  Cardioversion 12/25/2019 was unsuccessful. Underwent repeat cardioversion successful to normal sinus rhythm 02/06/2020.   Patient presents for 4-week follow-up of atrial fibrillation following successful cardioversion 02/06/2020.  EKG today revealed patient is maintaining sinus rhythm.  We will continue amiodarone 200 mg daily, Eliquis, and metoprolol.  Patient's blood pressure  remains elevated above goal.  We will increase amlodipine 5mg  to 10 mg daily.  Continue valsartan/hydrochlorothiazide.  Discussed with patient regarding sleep study evaluation. She reports she is still in the process of scheduling this as she and her husband would like to do it together. Encouraged her to continue to move forward with sleep evaluation.  Also again discussed with patient regarding weight loss.    Follow up in 6 weeks for hypertension.    Alethia Berthold, PA-C 02/29/2020, 2:42 PM Office: [phone]

## 2020-02-29 ENCOUNTER — Encounter: Payer: Self-pay | Admitting: Cardiology

## 2020-02-29 ENCOUNTER — Other Ambulatory Visit: Payer: Self-pay

## 2020-02-29 ENCOUNTER — Ambulatory Visit: Payer: Medicare Other | Admitting: Cardiology

## 2020-02-29 ENCOUNTER — Telehealth: Payer: Self-pay

## 2020-02-29 VITALS — BP 145/86 | HR 67 | Ht 67.0 in | Wt 276.0 lb

## 2020-02-29 DIAGNOSIS — Z6841 Body Mass Index (BMI) 40.0 and over, adult: Secondary | ICD-10-CM

## 2020-02-29 DIAGNOSIS — I10 Essential (primary) hypertension: Secondary | ICD-10-CM

## 2020-02-29 DIAGNOSIS — I4819 Other persistent atrial fibrillation: Secondary | ICD-10-CM

## 2020-02-29 MED ORDER — AMLODIPINE BESYLATE 5 MG PO TABS: 10.0000 mg | ORAL_TABLET | Freq: Every day | ORAL | 2 refills | Status: DC

## 2020-02-29 NOTE — Telephone Encounter (Signed)
Handicap paperwork completed. Patient needs to fill out part of the form. It was put at front desk for her to pick up and complete.

## 2020-02-29 NOTE — Telephone Encounter (Signed)
Patient called to ask if we had any documentation of her blood type. She thought it might be in her records from [place], but I did not see anything. She saw Dr. Einar Gip today and also was unable to see anything.  She inquired about getting a handicap placard.  She stated she had another cardioversion on 02/06/20 and that it was successful.

## 2020-03-15 ENCOUNTER — Other Ambulatory Visit: Payer: Self-pay | Admitting: Nurse Practitioner

## 2020-03-15 DIAGNOSIS — I4891 Unspecified atrial fibrillation: Secondary | ICD-10-CM

## 2020-04-01 ENCOUNTER — Other Ambulatory Visit: Payer: Self-pay | Admitting: Cardiology

## 2020-04-01 DIAGNOSIS — I4819 Other persistent atrial fibrillation: Secondary | ICD-10-CM

## 2020-04-09 ENCOUNTER — Other Ambulatory Visit: Payer: Self-pay | Admitting: *Deleted

## 2020-04-09 DIAGNOSIS — I10 Essential (primary) hypertension: Secondary | ICD-10-CM

## 2020-04-09 MED ORDER — ROSUVASTATIN CALCIUM 5 MG PO TABS
5.0000 mg | ORAL_TABLET | Freq: Every day | ORAL | 1 refills | Status: DC
Start: 2020-04-09 — End: 2020-09-11

## 2020-04-09 MED ORDER — AMLODIPINE BESYLATE 5 MG PO TABS: 5.0000 mg | ORAL_TABLET | Freq: Every day | ORAL | 1 refills | Status: DC

## 2020-04-09 MED ORDER — VALSARTAN-HYDROCHLOROTHIAZIDE 160-12.5 MG PO TABS
1.0000 | ORAL_TABLET | Freq: Every day | ORAL | 1 refills | Status: DC
Start: 2020-04-09 — End: 2020-09-11

## 2020-04-09 NOTE — Telephone Encounter (Signed)
Patient requested refill Patient has upcoming appointment scheduled.

## 2020-04-09 NOTE — Addendum Note (Signed)
Addended by: Rafael Bihari A on: 04/09/2020 04:33 PM   Modules accepted: Orders

## 2020-04-17 ENCOUNTER — Other Ambulatory Visit: Payer: Self-pay

## 2020-04-17 ENCOUNTER — Encounter: Payer: Self-pay | Admitting: Cardiology

## 2020-04-17 ENCOUNTER — Ambulatory Visit: Payer: Medicare Other | Admitting: Cardiology

## 2020-04-17 VITALS — BP 134/81 | HR 90 | Temp 97.7°F | Resp 17 | Ht 67.0 in | Wt 280.2 lb

## 2020-04-17 DIAGNOSIS — I10 Essential (primary) hypertension: Secondary | ICD-10-CM

## 2020-04-17 DIAGNOSIS — I480 Paroxysmal atrial fibrillation: Secondary | ICD-10-CM

## 2020-04-17 MED ORDER — APIXABAN 5 MG PO TABS: 5.0000 mg | ORAL_TABLET | Freq: Two times a day (BID) | ORAL | 3 refills | Status: DC

## 2020-04-17 MED ORDER — AMIODARONE HCL 200 MG PO TABS: 100.0000 mg | ORAL_TABLET | Freq: Every day | ORAL | 3 refills | Status: DC

## 2020-04-17 MED ORDER — METOPROLOL SUCCINATE ER 25 MG PO TB24: 25.0000 mg | ORAL_TABLET | Freq: Every day | ORAL | 3 refills | Status: DC

## 2020-04-17 MED ORDER — AMLODIPINE BESYLATE 10 MG PO TABS: 10.0000 mg | ORAL_TABLET | Freq: Every day | ORAL | 3 refills | Status: DC

## 2020-04-17 NOTE — Progress Notes (Signed)
Primary Physician/Referring:  Lauree Chandler, NP  Patient ID: Terry Koch, female    DOB: Jul 26, 1945, 75 y.o.   MRN: [id number]  Chief Complaint  Patient presents with  . Hypertension    6 WEEKS  . Atrial Fibrillation   HPI:    Terry Koch  is a 75 y.o. African-American female with hypertension, hyperlipidemia, obesity, colonic polyps requiring serial surveillance colonoscopy, no prior history of GI bleed underwent cardioversion 12/25/2019 was unsuccessful. Underwent repeat cardioversion with Amiodarone on board successfully to normal sinus rhythm 02/06/2020.  She presents here for a 6-week follow-up of hypertension and atrial fibrillation.  Amlodipine dose was increased on her last office visit.  Patient is presently feeling well.  Denies chest pain, dyspnea, palpitations, fatigue, dizziness, syncope, near syncope.  Tolerating Eliquis without bleeding diathesis.  Past Medical History:  Diagnosis Date  . Arthritis    self report  . Bone fracture    Right Foot  . H/O hernia repair   . History of hysterectomy   . History of mammogram    Dr.Elizabeth Gwenlyn Saran  . History of MRI    Dr.Elizabeth Gwenlyn Saran.  Marland Kitchen History of removal of skin mole 03/09/1997  . Hyperlipidemia   . Hypertension   . Scalp hematoma   . Wears glasses    Past Surgical History:  Procedure Laterality Date  . ABDOMINAL HYSTERECTOMY    . BREAST BIOPSY  03/09/1997   Dr.Finder  . CARDIOVERSION N/A 12/25/2019   Procedure: CARDIOVERSION;  Surgeon: Adrian Prows, MD;  Location: [facility] ENDOSCOPY;  Service: Cardiovascular;  Laterality: N/A;  . CARDIOVERSION N/A 02/06/2020   Procedure: CARDIOVERSION;  Surgeon: Adrian Prows, MD;  Location: [facility];  Service: Cardiovascular;  Laterality: N/A;  . CHOLECYSTECTOMY     ~ 6 yrs ago  . COLONOSCOPY  2009   Dr.Chesley  . CYSTECTOMY  09/04/2019   Dr.Gray  . GALLBLADDER SURGERY  03/10/2015   Dr.[facility] )  . POLYPECTOMY     12 polyps removed at [facility]  2016   Family History  Problem Relation Age of Onset  . Breast cancer Mother   . Alzheimer's disease Mother   . Dementia Mother   . Parkinson's disease Mother   . Colon polyps Mother   . Stroke Father   . Dementia Father   . Breast cancer Sister   . Allergies Daughter   . Allergies Son   . Breast cancer Maternal Aunt   . Allergies Daughter   . Colon cancer Neg Hx   . Esophageal cancer Neg Hx   . Stomach cancer Neg Hx   . Rectal cancer Neg Hx     Social History   Tobacco Use  . Smoking status: Never Smoker  . Smokeless tobacco: Never Used  Substance Use Topics  . Alcohol use: Yes    Alcohol/week: 1.0 standard drink    Types: 1 Glasses of wine per week    Comment: occa   Marital Status: Married  ROS  Review of Systems  Cardiovascular: Negative for chest pain, dyspnea on exertion and leg swelling.  Gastrointestinal: Negative for melena.   Objective  Blood pressure 134/81, pulse 90, temperature 97.7 F (36.5 C), temperature source Temporal, resp. rate 17, height 5\' 7"  (1.702 m), weight 280 lb 3.2 oz (127.1 kg), SpO2 98 %.  Vitals with BMI 04/17/2020 02/29/2020 02/06/2020  Height 5\' 7"  5\' 7"  -  Weight 280 lbs 3 oz 276 lbs -  BMI  90.24 09.73 -  Systolic 532 992 426  Diastolic 81 86 69  Pulse 90 67 52     Physical Exam Vitals reviewed.  Constitutional:      Comments: Morbidly obese in no acute distress.  HENT:     Head: Normocephalic and atraumatic.  Cardiovascular:     Rate and Rhythm: Normal rate and regular rhythm.     Pulses: Intact distal pulses.          Carotid pulses are 2+ on the right side and 2+ on the left side.      Dorsalis pedis pulses are 2+ on the right side and 2+ on the left side.       Posterior tibial pulses are 2+ on the right side and 2+ on the left side.     Heart sounds: Normal heart sounds, S1 normal and S2 normal. No murmur heard. No gallop.      Comments: Femoral and popliteal pulse difficult to feel due to  patient's body habitus.  No leg edema. JVD difficult to see due to short neck. Pulmonary:     Effort: Pulmonary effort is normal. No respiratory distress.     Breath sounds: Normal breath sounds. No wheezing, rhonchi or rales.  Abdominal:     General: Bowel sounds are normal.     Palpations: Abdomen is soft.     Comments: Obese. Pannus present  Musculoskeletal:     Right lower leg: No edema.     Left lower leg: No edema.  Neurological:     Mental Status: She is alert.    Laboratory examination:   Recent Labs    10/04/19 1617 12/07/19 1424 02/06/20 1038  NA 142 141 141  K 3.7 4.0 3.5  CL 105 104 104  CO2 28 29  --   GLUCOSE 91 96 104*  BUN 9 13 9   CREATININE 0.94* 0.98* 0.90  CALCIUM 9.1 9.7  --   GFRNONAA 60 57*  --   GFRAA 69 66  --    CrCl cannot be calculated (Patient's most recent lab result is older than the maximum 21 days allowed.).  CMP Latest Ref Rng & Units 02/06/2020 12/07/2019 10/04/2019  Glucose 70 - 99 mg/dL 104(H) 96 91  BUN 8 - 23 mg/dL 9 13 9   Creatinine 0.44 - 1.00 mg/dL 0.90 0.98(H) 0.94(H)  Sodium 135 - 145 mmol/L 141 141 142  Potassium 3.5 - 5.1 mmol/L 3.5 4.0 3.7  Chloride 98 - 111 mmol/L 104 104 105  CO2 20 - 32 mmol/L - 29 28  Calcium 8.6 - 10.4 mg/dL - 9.7 9.1  Total Protein 6.1 - 8.1 g/dL - 6.8 -  Total Bilirubin 0.2 - 1.2 mg/dL - 0.7 -  AST 10 - 35 U/L - 41(H) -  ALT 6 - 29 U/L - 43(H) -   CBC Latest Ref Rng & Units 02/06/2020 12/07/2019 10/04/2019  WBC 3.8 - 10.8 Thousand/uL - 5.7 6.8  Hemoglobin 12.0 - 15.0 g/dL 13.6 13.0 12.7  Hematocrit 36.0 - 46.0 % 40.0 40.5 40.6  Platelets 140 - 400 Thousand/uL - 215 211    Lipid Panel Recent Labs    12/07/19 1424  CHOL 163  TRIG 123  LDLCALC 94  HDL 47*  CHOLHDL 3.5    HEMOGLOBIN A1C No results found for: HGBA1C, MPG TSH Recent Labs    10/04/19 1617  TSH 0.95   Medications and allergies  No Known Allergies   Outpatient Medications Prior to Visit  Medication  Sig Dispense  Refill  . Cholecalciferol (VITAMIN D3) 25 MCG (1000 UT) CAPS Take 1,000 Units by mouth daily.     Marland Kitchen CRANBERRY PO Take 1 tablet by mouth every other day. [employer]    . ELDERBERRY PO Take 50 mg by mouth daily.    . Multiple Vitamins-Minerals (COMPLETE MULTIVITAMIN/MINERAL PO) Take 1 tablet by mouth daily. [employer]    . OMEGA-3 KRILL OIL PO Take 350 mg by mouth daily.     . Psyllium (METAMUCIL FIBER PO) Take 10 mLs by mouth 3 (three) times a week. With water or Juice    . rosuvastatin (CRESTOR) 5 MG tablet Take 1 tablet (5 mg total) by mouth at bedtime. 90 tablet 1  . valsartan-hydrochlorothiazide (DIOVAN-HCT) 160-12.5 MG tablet Take 1 tablet by mouth daily. 90 tablet 1  . amiodarone (PACERONE) 200 MG tablet TAKE 1 TABLET(200 MG) BY MOUTH TWICE DAILY 60 tablet 2  . amLODipine (NORVASC) 5 MG tablet Take 1 tablet (5 mg total) by mouth daily. (Patient taking differently: Take 10 mg by mouth daily.) 90 tablet 1  . apixaban (ELIQUIS) 5 MG TABS tablet Take 1 tablet (5 mg total) by mouth 2 (two) times daily. 180 tablet 1  . metoprolol succinate (TOPROL-XL) 50 MG 24 hr tablet TAKE 1 TABLET(50 MG) BY MOUTH DAILY (Patient taking differently: Take 50 mg by mouth daily.) 90 tablet 0   No facility-administered medications prior to visit.     Radiology:   No results found.  Cardiac Studies:   Exercise Myoview stress test 10/18/2019: Exercise nuclear stress test was performed using Bruce protocol. Patient reached 5.1 METS, and 98% of age predicted maximum heart rate. Exercise capacity was low. No chest pain reported. Heart rate and hemodynamic response were normal. Stress EKG revealed no ischemic changes. Normal myocardial perfusion. Stress LVEF calculated 49%, although visually appears normal. Low risk study.  Echocardiogram 10/25/2019:  Left ventricle cavity is normal in size. Mild concentric hypertrophy of  the left ventricle. Normal global wall motion. Normal LV systolic function with  EF 55%. Doppler evidence of grade II (pseudonormal) diastolic dysfunction.  Left atrial cavity is severely dilated. Integrity of interatrial septum could not be adequately assessed to limited visualization. Consider  alternate imaging study, if clinically indicated.  Trileaflet aortic valve. Trace aortic regurgitation.  Mild tricuspid regurgitation. No evidence of pulmonary hypertension.  Cardioversion 12/25/2019:  Indication symptomatic A. Fibrillation. Procedure: Using 90 mg of IV Propofol and 100 IV Lidocaine (for reducing venous pain) for achieving deep sedation, synchronized direct current cardioversion performed. Patient was delivered with 150 x 1 then 200 Joules of electricity X 3 without success. Patient tolerated the procedure well. No immediate complication noted.    Direct current cardioversion 02/06/20 : Indication symptomatic A. Fibrillation. Procedure: Using 100 mg of IV Propofol and 40 IV Lidocaine (for reducing venous pain) for achieving deep sedation, synchronized direct current cardioversion performed. Patient was delivered with 200J x 1 then 350 Joules of electricity X 1 with success to NSR. Patient tolerated the procedure well. No immediate complication noted.    EKG:     EKG 04/17/2020: Normal sinus rhythm at rate of 78 bpm, leftward enlargement, left axis deviation, left anterior fascicular block.  Poor R progression, cannot exclude anteroseptal infarct old.  Nonspecific T abnormality.   No significant change from EKG 02/29/2020   EKG 02/06/2020: Sinus bradycardia with first-degree AV block at rate of 58 bpm, left atrial enlargement, poor R wave progression, cannot exclude anteroseptal infarct  old.  Nonspecific T abnormality.  Normal QT interval.  EKG 01/02/2020: Atrial fibrillation with controlled ventricular response at the rate of 74 bpm, leftward axis, poor R wave progression, cannot exclude anteroseptal infarct old.  Nonspecific T abnormality.  No significant change  from 11/23/2019.  Assessment     ICD-10-CM   1. Paroxysmal atrial fibrillation (HCC)  I48.0 EKG 12-Lead    metoprolol succinate (TOPROL-XL) 25 MG 24 hr tablet    amiodarone (PACERONE) 200 MG tablet    apixaban (ELIQUIS) 5 MG TABS tablet  2. Primary hypertension  I10 metoprolol succinate (TOPROL-XL) 25 MG 24 hr tablet    amLODipine (NORVASC) 10 MG tablet  3. Class 3 severe obesity due to excess calories with serious comorbidity and body mass index (BMI) of 40.0 to 44.9 in adult (HCC)  E66.01    Z68.41      CHA2DS2-VASc Score is 3.  Yearly risk of stroke: 3.2% (A, F, HTN).   Medications Discontinued During This Encounter  Medication Reason  . metoprolol succinate (TOPROL-XL) 50 MG 24 hr tablet Reorder  . apixaban (ELIQUIS) 5 MG TABS tablet Reorder  . amiodarone (PACERONE) 200 MG tablet   . amLODipine (NORVASC) 5 MG tablet Reorder    Meds ordered this encounter  Medications  . metoprolol succinate (TOPROL-XL) 25 MG 24 hr tablet    Sig: Take 1 tablet (25 mg total) by mouth daily. Take with or immediately following a meal.    Dispense:  90 tablet    Refill:  3  . amiodarone (PACERONE) 200 MG tablet    Sig: Take 0.5 tablets (100 mg total) by mouth daily.    Dispense:  90 tablet    Refill:  3  . apixaban (ELIQUIS) 5 MG TABS tablet    Sig: Take 1 tablet (5 mg total) by mouth 2 (two) times daily.    Dispense:  180 tablet    Refill:  3  . amLODipine (NORVASC) 10 MG tablet    Sig: Take 1 tablet (10 mg total) by mouth daily.    Dispense:  90 tablet    Refill:  3   Recommendations:   Terry Koch is a 75 y.o. African-American female with hypertension, hyperlipidemia, obesity, colonic polyps requiring serial surveillance colonoscopy, no prior history of GI bleed underwent cardioversion 12/25/2019 was unsuccessful. Underwent repeat cardioversion with Amiodarone on board successfully to normal sinus rhythm 02/06/2020.   Patient presents for 6-week follow-up of atrial fibrillation  and hypertension.  I had increased the dose of amlodipine to 10 mg.  She is maintaining sinus rhythm, she is feeling the best she has in quite a while with complete resolution of shortness of breath and fatigue.  Blood pressure is also now well controlled.  I will decrease the dose of amiodarone from 200 mg to 100 mg daily, will restart metoprolol succinate which was discontinued due to first-degree AV block and bradycardia after cardioversion and will started at 25 mg daily instead of 50 mg.  Weight loss was discussed extensively.  I would like to see her back in 6 months or sooner if problems.    Adrian Prows, PA-C 04/17/2020, 3:31 PM Office: [phone]

## 2020-05-23 ENCOUNTER — Other Ambulatory Visit: Payer: Self-pay

## 2020-05-23 ENCOUNTER — Ambulatory Visit: Payer: Medicare Other | Admitting: Cardiology

## 2020-05-23 VITALS — BP 134/82 | HR 71 | Temp 98.0°F | Resp 17 | Ht 67.0 in | Wt 281.0 lb

## 2020-05-23 DIAGNOSIS — R072 Precordial pain: Secondary | ICD-10-CM

## 2020-05-23 DIAGNOSIS — I10 Essential (primary) hypertension: Secondary | ICD-10-CM

## 2020-05-23 DIAGNOSIS — I4819 Other persistent atrial fibrillation: Secondary | ICD-10-CM

## 2020-05-23 MED ORDER — AMLODIPINE BESYLATE 10 MG PO TABS: 5.0000 mg | ORAL_TABLET | Freq: Every day | ORAL | 3 refills | Status: DC

## 2020-05-23 MED ORDER — HYDROCHLOROTHIAZIDE 12.5 MG PO CAPS: 12.5000 mg | ORAL_CAPSULE | Freq: Every day | ORAL | 3 refills | Status: DC

## 2020-05-23 NOTE — Progress Notes (Signed)
Primary Physician/Referring:  Lauree Chandler, NP  Patient ID: Terry Koch, female    DOB: 1945-10-27, 75 y.o.   MRN: [id number]  Chief Complaint  Patient presents with  . Chest Pain  . Left arm pain   HPI:    Terry Koch  is a 75 y.o. African-American female with hypertension, hyperlipidemia, obesity, colonic polyps requiring serial surveillance colonoscopy, no prior history of GI bleed underwent cardioversion 12/25/2019 was unsuccessful. Underwent repeat cardioversion with Amiodarone on board successfully to normal sinus rhythm 02/06/2020.  Patient presents for urgent visit at her request with complaints of left arm pain and left anterior shoulder pain as well as right knee pain.  She states approximately 3 weeks ago she had a fall when she tripped walking up the steps.  Since then she has noticed pain radiating from her left to right shoulder as well as pain on her left anterior shoulder radiating into her left upper chest wall.  Patient reports pain improves with movement and exercise, it is worse when she has been sleeping or resting for period of time.  Pain is significant when she has been still and then tries to move her left arm.  Denies dyspnea, palpitations, fatigue, dizziness, syncope, near syncope.  Denies orthopnea, PND.  Rating Eliquis without bleeding diathesis.   She has noticed that since increasing amlodipine from 5 mg to 10 mg daily she has had increased bilateral ankle swelling.  Past Medical History:  Diagnosis Date  . Arthritis    self report  . Bone fracture    Right Foot  . H/O hernia repair   . History of hysterectomy   . History of mammogram    Dr.Elizabeth Gwenlyn Saran  . History of MRI    Dr.Elizabeth Gwenlyn Saran.  Marland Kitchen History of removal of skin mole 03/09/1997  . Hyperlipidemia   . Hypertension   . Scalp hematoma   . Wears glasses    Past Surgical History:  Procedure Laterality Date  . ABDOMINAL HYSTERECTOMY    . BREAST BIOPSY  03/09/1997    Dr.Finder  . CARDIOVERSION N/A 12/25/2019   Procedure: CARDIOVERSION;  Surgeon: Adrian Prows, MD;  Location: [facility] ENDOSCOPY;  Service: Cardiovascular;  Laterality: N/A;  . CARDIOVERSION N/A 02/06/2020   Procedure: CARDIOVERSION;  Surgeon: Adrian Prows, MD;  Location: [facility];  Service: Cardiovascular;  Laterality: N/A;  . CHOLECYSTECTOMY     ~ 6 yrs ago  . COLONOSCOPY  2009   Dr.Chesley  . CYSTECTOMY  09/04/2019   Dr.Gray  . GALLBLADDER SURGERY  03/10/2015   Dr.Amid [facility] )  . POLYPECTOMY     12 polyps removed at [place]  2016   Family History  Problem Relation Age of Onset  . Breast cancer Mother   . Alzheimer's disease Mother   . Dementia Mother   . Parkinson's disease Mother   . Colon polyps Mother   . Stroke Father   . Dementia Father   . Breast cancer Sister   . Allergies Daughter   . Allergies Son   . Breast cancer Maternal Aunt   . Allergies Daughter   . Colon cancer Neg Hx   . Esophageal cancer Neg Hx   . Stomach cancer Neg Hx   . Rectal cancer Neg Hx     Social History   Tobacco Use  . Smoking status: Never Smoker  . Smokeless tobacco: Never Used  Substance Use Topics  . Alcohol use: Yes  Alcohol/week: 1.0 standard drink    Types: 1 Glasses of wine per week    Comment: occa   Marital Status: Married  ROS  Review of Systems  Constitutional: Negative for malaise/fatigue and weight gain.  Cardiovascular: Positive for chest pain and leg swelling (at the ankles since increasing amlodipine). Negative for claudication, dyspnea on exertion, near-syncope, orthopnea, palpitations, paroxysmal nocturnal dyspnea and syncope.  Respiratory: Negative for shortness of breath.   Hematologic/Lymphatic: Does not bruise/bleed easily.  Musculoskeletal: Positive for arthritis (shoulders and knees) and joint pain (bilateral shoulders).  Gastrointestinal: Negative for melena.  Neurological: Negative for dizziness and weakness.   Objective   Blood pressure 134/82, pulse 71, temperature 98 F (36.7 C), resp. rate 17, height 5' 7"  (1.702 m), weight 281 lb (127.5 kg), SpO2 98 %.  Vitals with BMI 05/23/2020 05/23/2020 05/23/2020  Height - - 5' 7"   Weight - - 281 lbs  BMI - - 44  Systolic 038 333 832  Diastolic 82 84 94  Pulse - 71 69     Physical Exam Vitals reviewed.  Constitutional:      Comments: Morbidly obese in no acute distress.  HENT:     Head: Normocephalic and atraumatic.  Cardiovascular:     Rate and Rhythm: Normal rate and regular rhythm.     Pulses: Intact distal pulses.          Carotid pulses are 2+ on the right side and 2+ on the left side.      Dorsalis pedis pulses are 2+ on the right side and 2+ on the left side.       Posterior tibial pulses are 2+ on the right side and 2+ on the left side.     Heart sounds: Normal heart sounds, S1 normal and S2 normal. No murmur heard. No gallop.      Comments: Femoral and popliteal pulse difficult to feel due to patient's body habitus.  JVD difficult to see due to short neck. Minimal bilateral ankle swelling Pulmonary:     Effort: Pulmonary effort is normal. No respiratory distress.     Breath sounds: Normal breath sounds. No wheezing, rhonchi or rales.  Abdominal:     General: Bowel sounds are normal.     Palpations: Abdomen is soft.     Comments: Obese. Pannus present  Musculoskeletal:        General: Tenderness (to palpation along left scapula and left chest wall) present.       Arms:     Right lower leg: No edema.     Left lower leg: No edema.  Neurological:     Mental Status: She is alert.    Laboratory examination:   Recent Labs    10/04/19 1617 12/07/19 1424 02/06/20 1038  NA 142 141 141  K 3.7 4.0 3.5  CL 105 104 104  CO2 28 29  --   GLUCOSE 91 96 104*  BUN 9 13 9   CREATININE 0.94* 0.98* 0.90  CALCIUM 9.1 9.7  --   GFRNONAA 60 57*  --   GFRAA 69 66  --    CrCl cannot be calculated (Patient's most recent lab result is older than the  maximum 21 days allowed.).  CMP Latest Ref Rng & Units 02/06/2020 12/07/2019 10/04/2019  Glucose 70 - 99 mg/dL 104(H) 96 91  BUN 8 - 23 mg/dL 9 13 9   Creatinine 0.44 - 1.00 mg/dL 0.90 0.98(H) 0.94(H)  Sodium 135 - 145 mmol/L 141 141 142  Potassium 3.5 -  5.1 mmol/L 3.5 4.0 3.7  Chloride 98 - 111 mmol/L 104 104 105  CO2 20 - 32 mmol/L - 29 28  Calcium 8.6 - 10.4 mg/dL - 9.7 9.1  Total Protein 6.1 - 8.1 g/dL - 6.8 -  Total Bilirubin 0.2 - 1.2 mg/dL - 0.7 -  AST 10 - 35 U/L - 41(H) -  ALT 6 - 29 U/L - 43(H) -   CBC Latest Ref Rng & Units 02/06/2020 12/07/2019 10/04/2019  WBC 3.8 - 10.8 Thousand/uL - 5.7 6.8  Hemoglobin 12.0 - 15.0 g/dL 13.6 13.0 12.7  Hematocrit 36.0 - 46.0 % 40.0 40.5 40.6  Platelets 140 - 400 Thousand/uL - 215 211    Lipid Panel Recent Labs    12/07/19 1424  CHOL 163  TRIG 123  LDLCALC 94  HDL 47*  CHOLHDL 3.5    HEMOGLOBIN A1C No results found for: HGBA1C, MPG TSH Recent Labs    10/04/19 1617  TSH 0.95   Medications and allergies  No Known Allergies   Outpatient Medications Prior to Visit  Medication Sig Dispense Refill  . amiodarone (PACERONE) 200 MG tablet Take 0.5 tablets (100 mg total) by mouth daily. 90 tablet 3  . apixaban (ELIQUIS) 5 MG TABS tablet Take 1 tablet (5 mg total) by mouth 2 (two) times daily. 180 tablet 3  . Cholecalciferol (VITAMIN D3) 25 MCG (1000 UT) CAPS Take 1,000 Units by mouth daily.     Marland Kitchen CRANBERRY PO Take 1 tablet by mouth every other day. [employer]    . ELDERBERRY PO Take 50 mg by mouth daily.    . metoprolol succinate (TOPROL-XL) 25 MG 24 hr tablet Take 1 tablet (25 mg total) by mouth daily. Take with or immediately following a meal. 90 tablet 3  . Multiple Vitamins-Minerals (COMPLETE MULTIVITAMIN/MINERAL PO) Take 1 tablet by mouth daily. [employer] Vitamin    . OMEGA-3 KRILL OIL PO Take 350 mg by mouth daily.     . Psyllium (METAMUCIL FIBER PO) Take 10 mLs by mouth 3 (three) times a week. With water or Juice    .  rosuvastatin (CRESTOR) 5 MG tablet Take 1 tablet (5 mg total) by mouth at bedtime. 90 tablet 1  . valsartan-hydrochlorothiazide (DIOVAN-HCT) 160-12.5 MG tablet Take 1 tablet by mouth daily. 90 tablet 1  . amLODipine (NORVASC) 10 MG tablet Take 1 tablet (10 mg total) by mouth daily. 90 tablet 3   No facility-administered medications prior to visit.     Radiology:   No results found.  Cardiac Studies:   Exercise Myoview stress test 10/18/2019: Exercise nuclear stress test was performed using Bruce protocol. Patient reached 5.1 METS, and 98% of age predicted maximum heart rate. Exercise capacity was low. No chest pain reported. Heart rate and hemodynamic response were normal. Stress EKG revealed no ischemic changes. Normal myocardial perfusion. Stress LVEF calculated 49%, although visually appears normal. Low risk study.  Echocardiogram 10/25/2019:  Left ventricle cavity is normal in size. Mild concentric hypertrophy of  the left ventricle. Normal global wall motion. Normal LV systolic function with EF 55%. Doppler evidence of grade II (pseudonormal) diastolic dysfunction.  Left atrial cavity is severely dilated. Integrity of interatrial septum could not be adequately assessed to limited visualization. Consider  alternate imaging study, if clinically indicated.  Trileaflet aortic valve. Trace aortic regurgitation.  Mild tricuspid regurgitation. No evidence of pulmonary hypertension.  Cardioversion 12/25/2019:  Indication symptomatic A. Fibrillation. Procedure: Using 90 mg of IV Propofol and 100 IV Lidocaine (  for reducing venous pain) for achieving deep sedation, synchronized direct current cardioversion performed. Patient was delivered with 150 x 1 then 200 Joules of electricity X 3 without success. Patient tolerated the procedure well. No immediate complication noted.    Direct current cardioversion 02/06/20 : Indication symptomatic A. Fibrillation. Procedure: Using 100 mg of IV Propofol  and 40 IV Lidocaine (for reducing venous pain) for achieving deep sedation, synchronized direct current cardioversion performed. Patient was delivered with 200J x 1 then 350 Joules of electricity X 1 with success to NSR. Patient tolerated the procedure well. No immediate complication noted.    EKG:   EKG 05/23/2020: Sinus rhythm at a rate of 69 bpm.  Left atrial enlargement.  Left axis, left anterior fascicular block.  Poor progression, cannot exclude anteroseptal infarct old.  Nonspecific T wave abnormality, no significant change compared to EKG 04/17/2020.  EKG 04/17/2020: Normal sinus rhythm at rate of 78 bpm, leftward enlargement, left axis deviation, left anterior fascicular block.  Poor R progression, cannot exclude anteroseptal infarct old.  Nonspecific T abnormality.   No significant change from EKG 02/29/2020   EKG 02/06/2020: Sinus bradycardia with first-degree AV block at rate of 58 bpm, left atrial enlargement, poor R wave progression, cannot exclude anteroseptal infarct old.  Nonspecific T abnormality.  Normal QT interval.  EKG 01/02/2020: Atrial fibrillation with controlled ventricular response at the rate of 74 bpm, leftward axis, poor R wave progression, cannot exclude anteroseptal infarct old.  Nonspecific T abnormality.  No significant change from 11/23/2019.  Assessment     ICD-10-CM   1. Precordial pain  R07.2 EKG 12-Lead  2. Primary hypertension  I10 amLODipine (NORVASC) 10 MG tablet    CMP14+EGFR    CMP14+EGFR  3. Persistent atrial fibrillation (HCC)  I48.19   4. Class 3 severe obesity due to excess calories with serious comorbidity and body mass index (BMI) of 40.0 to 44.9 in adult (HCC)  E66.01    Z68.41      CHA2DS2-VASc Score is 3.  Yearly risk of stroke: 3.2% (A, F, HTN).   Medications Discontinued During This Encounter  Medication Reason  . amLODipine (NORVASC) 10 MG tablet     Meds ordered this encounter  Medications  . amLODipine (NORVASC) 10 MG tablet     Sig: Take 0.5 tablets (5 mg total) by mouth daily.    Dispense:  90 tablet    Refill:  3  . hydrochlorothiazide (MICROZIDE) 12.5 MG capsule    Sig: Take 1 capsule (12.5 mg total) by mouth daily.    Dispense:  30 capsule    Refill:  3   Recommendations:   Terry Koch is a 75 y.o. African-American female with hypertension, hyperlipidemia, obesity, colonic polyps requiring serial surveillance colonoscopy, no prior history of GI bleed underwent cardioversion 12/25/2019 was unsuccessful. Underwent repeat cardioversion with Amiodarone on board successfully to normal sinus rhythm 02/06/2020.  Patient presents for urgent visit with concerns of left arm and chest pain.  EKG today is unchanged compared to previous without evidence of ischemia or injury pattern, she in sinus rhythm and well rate controlled.  Patient symptoms are stable with underlying musculoskeletal etiology and exam is reassuring this.  Symptoms in fact sound consistent with arthritic pain, however may be due to recent fall.  Recommend patient follow-up with PCP for further evaluation and management of underlying musculoskeletal cause.  In regard to ankle swelling, likely due to increased dose of amlodipine.  Therefore will reduce amlodipine to 5 mg daily and  increase hydrochlorothiazide from 12.5 mg daily to 25 mg daily.  We will repeat BMP in 7 to 10 days.  Follow-up in 6 months, sooner if needed, as previously scheduled with Dr. Einar Gip.   Alethia Berthold, PA-C 05/24/2020, 6:06 PM Office: [phone]

## 2020-06-05 ENCOUNTER — Encounter: Payer: Self-pay | Admitting: Nurse Practitioner

## 2020-06-05 ENCOUNTER — Ambulatory Visit (INDEPENDENT_AMBULATORY_CARE_PROVIDER_SITE_OTHER): Payer: Medicare Other | Admitting: Nurse Practitioner

## 2020-06-05 ENCOUNTER — Other Ambulatory Visit: Payer: Self-pay

## 2020-06-05 VITALS — BP 112/66 | HR 68 | Temp 96.8°F | Ht 67.0 in | Wt 279.0 lb

## 2020-06-05 DIAGNOSIS — I4819 Other persistent atrial fibrillation: Secondary | ICD-10-CM

## 2020-06-05 DIAGNOSIS — R296 Repeated falls: Secondary | ICD-10-CM

## 2020-06-05 DIAGNOSIS — I10 Essential (primary) hypertension: Secondary | ICD-10-CM | POA: Diagnosis not present

## 2020-06-05 DIAGNOSIS — E782 Mixed hyperlipidemia: Secondary | ICD-10-CM | POA: Diagnosis not present

## 2020-06-05 DIAGNOSIS — M159 Polyosteoarthritis, unspecified: Secondary | ICD-10-CM

## 2020-06-05 DIAGNOSIS — M8949 Other hypertrophic osteoarthropathy, multiple sites: Secondary | ICD-10-CM

## 2020-06-05 NOTE — Patient Instructions (Signed)
Decrease sugary drinks Decrease "snacky" foods.  Increase protein  Increase activity.  Calorie Counting for Weight Loss Calories are units of energy. Your body needs a certain number of calories from food to keep going throughout the day. When you eat or drink more calories than your body needs, your body stores the extra calories mostly as fat. When you eat or drink fewer calories than your body needs, your body burns fat to get the energy it needs. Calorie counting means keeping track of how many calories you eat and drink each day. Calorie counting can be helpful if you need to lose weight. If you eat fewer calories than your body needs, you should lose weight. Ask your health care provider what a healthy weight is for you. For calorie counting to work, you will need to eat the right number of calories each day to lose a healthy amount of weight per week. A dietitian can help you figure out how many calories you need in a day and will suggest ways to reach your calorie goal.  A healthy amount of weight to lose each week is usually 1-2 lb (0.5-0.9 kg). This usually means that your daily calorie intake should be reduced by 500-750 calories.  Eating 1,200-1,500 calories a day can help most women lose weight.  Eating 1,500-1,800 calories a day can help most men lose weight. What do I need to know about calorie counting? Work with your health care provider or dietitian to determine how many calories you should get each day. To meet your daily calorie goal, you will need to:  Find out how many calories are in each food that you would like to eat. Try to do this before you eat.  Decide how much of the food you plan to eat.  Keep a food log. Do this by writing down what you ate and how many calories it had. To successfully lose weight, it is important to balance calorie counting with a healthy lifestyle that includes regular activity. Where do I find calorie information? The number of calories in  a food can be found on a Nutrition Facts label. If a food does not have a Nutrition Facts label, try to look up the calories online or ask your dietitian for help. Remember that calories are listed per serving. If you choose to have more than one serving of a food, you will have to multiply the calories per serving by the number of servings you plan to eat. For example, the label on a package of bread might say that a serving size is 1 slice and that there are 90 calories in a serving. If you eat 1 slice, you will have eaten 90 calories. If you eat 2 slices, you will have eaten 180 calories.   How do I keep a food log? After each time that you eat, record the following in your food log as soon as possible:  What you ate. Be sure to include toppings, sauces, and other extras on the food.  How much you ate. This can be measured in cups, ounces, or number of items.  How many calories were in each food and drink.  The total number of calories in the food you ate. Keep your food log near you, such as in a pocket-sized notebook or on an app or website on your mobile phone. Some programs will calculate calories for you and show you how many calories you have left to meet your daily goal. What are some  portion-control tips?  Know how many calories are in a serving. This will help you know how many servings you can have of a certain food.  Use a measuring cup to measure serving sizes. You could also try weighing out portions on a kitchen scale. With time, you will be able to estimate serving sizes for some foods.  Take time to put servings of different foods on your favorite plates or in your favorite bowls and cups so you know what a serving looks like.  Try not to eat straight from a food's packaging, such as from a bag or box. Eating straight from the package makes it hard to see how much you are eating and can lead to overeating. Put the amount you would like to eat in a cup or on a plate to make  sure you are eating the right portion.  Use smaller plates, glasses, and bowls for smaller portions and to prevent overeating.  Try not to multitask. For example, avoid watching TV or using your computer while eating. If it is time to eat, sit down at a table and enjoy your food. This will help you recognize when you are full. It will also help you be more mindful of what and how much you are eating. What are tips for following this plan? Reading food labels  Check the calorie count compared with the serving size. The serving size may be smaller than what you are used to eating.  Check the source of the calories. Try to choose foods that are high in protein, fiber, and vitamins, and low in saturated fat, trans fat, and sodium. Shopping  Read nutrition labels while you shop. This will help you make healthy decisions about which foods to buy.  Pay attention to nutrition labels for low-fat or fat-free foods. These foods sometimes have the same number of calories or more calories than the full-fat versions. They also often have added sugar, starch, or salt to make up for flavor that was removed with the fat.  Make a grocery list of lower-calorie foods and stick to it. Cooking  Try to cook your favorite foods in a healthier way. For example, try baking instead of frying.  Use low-fat dairy products. Meal planning  Use more fruits and vegetables. One-half of your plate should be fruits and vegetables.  Include lean proteins, such as chicken, [place], and fish. Lifestyle Each week, aim to do one of the following:  150 minutes of moderate exercise, such as walking.  75 minutes of vigorous exercise, such as running. General information  Know how many calories are in the foods you eat most often. This will help you calculate calorie counts faster.  Find a way of tracking calories that works for you. Get creative. Try different apps or programs if writing down calories does not work for  you. What foods should I eat?  Eat nutritious foods. It is better to have a nutritious, high-calorie food, such as an avocado, than a food with few nutrients, such as a bag of potato chips.  Use your calories on foods and drinks that will fill you up and will not leave you hungry soon after eating. ? Examples of foods that fill you up are nuts and nut butters, vegetables, lean proteins, and high-fiber foods such as whole grains. High-fiber foods are foods with more than 5 g of fiber per serving.  Pay attention to calories in drinks. Low-calorie drinks include water and unsweetened drinks. The items listed above  may not be a complete list of foods and beverages you can eat. Contact a dietitian for more information.   What foods should I limit? Limit foods or drinks that are not good sources of vitamins, minerals, or protein or that are high in unhealthy fats. These include:  Candy.  Other sweets.  Sodas, specialty coffee drinks, alcohol, and juice. The items listed above may not be a complete list of foods and beverages you should avoid. Contact a dietitian for more information. How do I count calories when eating out?  Pay attention to portions. Often, portions are much larger when eating out. Try these tips to keep portions smaller: ? Consider sharing a meal instead of getting your own. ? If you get your own meal, eat only half of it. Before you start eating, ask for a container and put half of your meal into it. ? When available, consider ordering smaller portions from the menu instead of full portions.  Pay attention to your food and drink choices. Knowing the way food is cooked and what is included with the meal can help you eat fewer calories. ? If calories are listed on the menu, choose the lower-calorie options. ? Choose dishes that include vegetables, fruits, whole grains, low-fat dairy products, and lean proteins. ? Choose items that are boiled, broiled, grilled, or steamed.  Avoid items that are buttered, battered, fried, or served with cream sauce. Items labeled as crispy are usually fried, unless stated otherwise. ? Choose water, low-fat milk, unsweetened iced tea, or other drinks without added sugar. If you want an alcoholic beverage, choose a lower-calorie option, such as a glass of wine or light beer. ? Ask for dressings, sauces, and syrups on the side. These are usually high in calories, so you should limit the amount you eat. ? If you want a salad, choose a garden salad and ask for grilled meats. Avoid extra toppings such as bacon, cheese, or fried items. Ask for the dressing on the side, or ask for olive oil and vinegar or lemon to use as dressing.  Estimate how many servings of a food you are given. Knowing serving sizes will help you be aware of how much food you are eating at restaurants. Where to find more information  Centers for Disease Control and Prevention: http://www.wolf.info/  [place] Department of Agriculture: http://www.wilson-mendoza.org/ Summary  Calorie counting means keeping track of how many calories you eat and drink each day. If you eat fewer calories than your body needs, you should lose weight.  A healthy amount of weight to lose per week is usually 1-2 lb (0.5-0.9 kg). This usually means reducing your daily calorie intake by 500-750 calories.  The number of calories in a food can be found on a Nutrition Facts label. If a food does not have a Nutrition Facts label, try to look up the calories online or ask your dietitian for help.  Use smaller plates, glasses, and bowls for smaller portions and to prevent overeating.  Use your calories on foods and drinks that will fill you up and not leave you hungry shortly after a meal. This information is not intended to replace advice given to you by your health care provider. Make sure you discuss any questions you have with your health care provider. Document Revised: 04/06/2019 Document Reviewed: 04/06/2019 Elsevier  Patient Education  2021 [employer].

## 2020-06-05 NOTE — Progress Notes (Signed)
Careteam: Patient Care Team: Lauree Chandler, NP as PCP - General (Geriatric Medicine)  PLACE OF SERVICE:  [place] Directive information Does Patient Have a Medical Advance Directive?: Yes, Type of Advance Directive: Out of facility DNR (pink MOST or yellow form), Pre-existing out of facility DNR order (yellow form or pink MOST form): Pink MOST form placed in chart (order not valid for inpatient use), Does patient want to make changes to medical advance directive?: No - Patient declined  No Known Allergies  Chief Complaint  Patient presents with  . Medical Management of Chronic Issues    6 month follow-up. Examine pain behind left right leg. Moderate fall risk. Discuss need for TD/tdap and  PNA     HPI: Patient is a 75 y.o. female for routine follow up.   She has had several falls over the last few months.  Did not step up high enough on the step and fell. Slipped on bedding and feel backwards.  Moves fast  Has had some bruising with falls. No persistent pain  Bilateral shoulder pain, left elbow pain and pain behind right knee. Uses muscle rub that helps a lot.   She has had some swelling in the left leg. Went to Dr Nadyne Coombes and he reduced amlodipine and increase HCTZ.  Swelling has been long standing.  She just got follow up lab work done   Review of Systems:  Review of Systems  Constitutional: Negative for chills, fever and weight loss.  HENT: Negative for tinnitus.   Respiratory: Negative for cough, sputum production and shortness of breath.   Cardiovascular: Negative for chest pain, palpitations and leg swelling.  Gastrointestinal: Negative for abdominal pain, constipation, diarrhea and heartburn.  Genitourinary: Negative for dysuria, frequency and urgency.  Musculoskeletal: Positive for falls and joint pain. Negative for back pain and myalgias.  Skin: Negative.   Neurological: Negative for dizziness and headaches.  Endo/Heme/Allergies: Bruises/bleeds  easily.  Psychiatric/Behavioral: Negative for depression and memory loss. The patient does not have insomnia.     Past Medical History:  Diagnosis Date  . Arthritis    self report  . Bone fracture    Right Foot  . H/O hernia repair   . History of hysterectomy   . History of mammogram    Dr.Elizabeth Gwenlyn Saran  . History of MRI    Dr.Elizabeth Gwenlyn Saran.  Marland Kitchen History of removal of skin mole 03/09/1997  . Hyperlipidemia   . Hypertension   . Scalp hematoma   . Wears glasses    Past Surgical History:  Procedure Laterality Date  . ABDOMINAL HYSTERECTOMY    . BREAST BIOPSY  03/09/1997   Dr.Finder  . CARDIOVERSION N/A 12/25/2019   Procedure: CARDIOVERSION;  Surgeon: Adrian Prows, MD;  Location: [facility] ENDOSCOPY;  Service: Cardiovascular;  Laterality: N/A;  . CARDIOVERSION N/A 02/06/2020   Procedure: CARDIOVERSION;  Surgeon: Adrian Prows, MD;  Location: [place];  Service: Cardiovascular;  Laterality: N/A;  . CHOLECYSTECTOMY     ~ 6 yrs ago  . COLONOSCOPY  2009   Dr.Chesley  . CYSTECTOMY  09/04/2019   Dr.Gray  . GALLBLADDER SURGERY  03/10/2015   Dr.Amid [facility] )  . POLYPECTOMY     12 polyps removed at [place]  2016   Social History:   reports that she has never smoked. She has never used smokeless tobacco. She reports current alcohol use of about 1.0 standard drink of alcohol per week. She reports  that she does not use drugs.  Family History  Problem Relation Age of Onset  . Breast cancer Mother   . Alzheimer's disease Mother   . Dementia Mother   . Parkinson's disease Mother   . Colon polyps Mother   . Stroke Father   . Dementia Father   . Breast cancer Sister   . Allergies Daughter   . Allergies Son   . Breast cancer Maternal Aunt   . Allergies Daughter   . Colon cancer Neg Hx   . Esophageal cancer Neg Hx   . Stomach cancer Neg Hx   . Rectal cancer Neg Hx     Medications: Patient's Medications  New Prescriptions   No medications on file   Previous Medications   AMIODARONE (PACERONE) 200 MG TABLET    Take 0.5 tablets (100 mg total) by mouth daily.   AMLODIPINE (NORVASC) 10 MG TABLET    Take 0.5 tablets (5 mg total) by mouth daily.   APIXABAN (ELIQUIS) 5 MG TABS TABLET    Take 1 tablet (5 mg total) by mouth 2 (two) times daily.   CHOLECALCIFEROL (VITAMIN D3) 25 MCG (1000 UT) CAPS    Take 1,000 Units by mouth daily.    HYDROCHLOROTHIAZIDE (MICROZIDE) 12.5 MG CAPSULE    Take 1 capsule (12.5 mg total) by mouth daily.   METOPROLOL SUCCINATE (TOPROL-XL) 25 MG 24 HR TABLET    Take 1 tablet (25 mg total) by mouth daily. Take with or immediately following a meal.   MULTIPLE VITAMINS-MINERALS (COMPLETE MULTIVITAMIN/MINERAL PO)    Take 1 tablet by mouth daily. [employer] Vitamin   OMEGA-3 KRILL OIL PO    Take 350 mg by mouth daily.    PSYLLIUM (METAMUCIL FIBER PO)    Take 10 mLs by mouth 3 (three) times a week. With water or Juice   ROSUVASTATIN (CRESTOR) 5 MG TABLET    Take 1 tablet (5 mg total) by mouth at bedtime.   VALSARTAN-HYDROCHLOROTHIAZIDE (DIOVAN-HCT) 160-12.5 MG TABLET    Take 1 tablet by mouth daily.  Modified Medications   No medications on file  Discontinued Medications   CRANBERRY PO    Take 1 tablet by mouth every other day. [employer]   ELDERBERRY PO    Take 50 mg by mouth daily.    Physical Exam:  Vitals:   06/05/20 1324  BP: 112/66  Pulse: 68  Temp: (!) 96.8 F (36 C)  TempSrc: Temporal  SpO2: 97%  Weight: 279 lb (126.6 kg)  Height: 5\' 7"  (1.702 m)   Body mass index is 43.7 kg/m. Wt Readings from Last 3 Encounters:  06/05/20 279 lb (126.6 kg)  05/23/20 281 lb (127.5 kg)  04/17/20 280 lb 3.2 oz (127.1 kg)    Physical Exam Constitutional:      General: She is not in acute distress.    Appearance: She is well-developed. She is not diaphoretic.  HENT:     Head: Normocephalic and atraumatic.     Mouth/Throat:     Pharynx: No oropharyngeal exudate.  Eyes:     Conjunctiva/sclera: Conjunctivae  normal.     Pupils: Pupils are equal, round, and reactive to light.  Cardiovascular:     Rate and Rhythm: Normal rate and regular rhythm.     Heart sounds: Normal heart sounds.  Pulmonary:     Effort: Pulmonary effort is normal.     Breath sounds: Normal breath sounds.  Abdominal:     General: Bowel sounds are normal.  Palpations: Abdomen is soft.  Musculoskeletal:        General: No tenderness.     Cervical back: Normal range of motion and neck supple.  Skin:    General: Skin is warm and dry.  Neurological:     Mental Status: She is alert and oriented to person, place, and time.     Labs reviewed: Basic Metabolic Panel: Recent Labs    10/04/19 1617 12/07/19 1424 02/06/20 1038  NA 142 141 141  K 3.7 4.0 3.5  CL 105 104 104  CO2 28 29  --   GLUCOSE 91 96 104*  BUN 9 13 9   CREATININE 0.94* 0.98* 0.90  CALCIUM 9.1 9.7  --   MG 1.9  --   --   TSH 0.95  --   --    Liver Function Tests: Recent Labs    12/07/19 1424  AST 41*  ALT 43*  BILITOT 0.7  PROT 6.8   No results for input(s): LIPASE, AMYLASE in the last 8760 hours. No results for input(s): AMMONIA in the last 8760 hours. CBC: Recent Labs    10/04/19 1617 12/07/19 1424 02/06/20 1038  WBC 6.8 5.7  --   NEUTROABS 4,332 3,734  --   HGB 12.7 13.0 13.6  HCT 40.6 40.5 40.0  MCV 84.1 83.9  --   PLT 211 215  --    Lipid Panel: Recent Labs    12/07/19 1424  CHOL 163  HDL 47*  LDLCALC 94  TRIG 123  CHOLHDL 3.5   TSH: Recent Labs    10/04/19 1617  TSH 0.95   A1C: No results found for: HGBA1C   Assessment/Plan 1. Essential hypertension Previously elevated, her amlodipine was reduced to 5 mg due to LE edema and hctz was increased.Continues on metoprolol and valsartan- hctz and amlodipine 5 mg daily   Follow up blood work done today. Blood pressure at goal today.   2. Mixed hyperlipidemia To continue on crestor with dietary modifications. LDL 94 on last labs, will get fasting lab work at  next appt.   3. Morbid obesity ([place]) Discussed healthy weight loss with diet and exercise. Offer nutritionist referral and referral to healthy weight and wellness but she declines at this time.   4. Persistent atrial fibrillation (HCC) Rate controlled on amiodarone and metoprolol. Continues on eliquis for anticoagulation. Had blood work today at cardiologist.   5. Recurrent falls Educated on fall prevention.  Discussed slowing down and using caution with movements.   6. Primary osteoarthritis involving multiple joints Stable using creams. Also can use tylenol PRN.    Next appt: 6 months, fasting labs at appt.  Carlos American. [place], [facility] [phone]

## 2020-06-06 LAB — CMP14+EGFR
ALT: 36 IU/L — ABNORMAL HIGH (ref 0–32)
AST: 33 IU/L (ref 0–40)
Albumin/Globulin Ratio: 1.5 (ref 1.2–2.2)
Albumin: 4.1 g/dL (ref 3.7–4.7)
Alkaline Phosphatase: 72 IU/L (ref 44–121)
BUN/Creatinine Ratio: 13 (ref 12–28)
BUN: 13 mg/dL (ref 8–27)
Bilirubin Total: 0.4 mg/dL (ref 0.0–1.2)
CO2: 25 mmol/L (ref 20–29)
Calcium: 9.3 mg/dL (ref 8.7–10.3)
Chloride: 104 mmol/L (ref 96–106)
Creatinine, Ser: 0.98 mg/dL (ref 0.57–1.00)
Globulin, Total: 2.8 g/dL (ref 1.5–4.5)
Glucose: 84 mg/dL (ref 65–99)
Potassium: 4.1 mmol/L (ref 3.5–5.2)
Sodium: 145 mmol/L — ABNORMAL HIGH (ref 134–144)
Total Protein: 6.9 g/dL (ref 6.0–8.5)
eGFR: 60 mL/min/{1.73_m2} (ref >59)

## 2020-06-06 NOTE — Progress Notes (Signed)
Spoke to patient she is aware

## 2020-06-06 NOTE — Progress Notes (Signed)
Please inform patient her kidney function and electrolytes are stable.

## 2020-08-19 ENCOUNTER — Other Ambulatory Visit: Payer: Self-pay | Admitting: Student

## 2020-09-11 ENCOUNTER — Other Ambulatory Visit: Payer: Self-pay | Admitting: Nurse Practitioner

## 2020-09-17 ENCOUNTER — Ambulatory Visit (INDEPENDENT_AMBULATORY_CARE_PROVIDER_SITE_OTHER): Payer: Medicare Other | Admitting: Family Medicine

## 2020-09-17 ENCOUNTER — Encounter: Payer: Self-pay | Admitting: Family Medicine

## 2020-09-17 ENCOUNTER — Other Ambulatory Visit: Payer: Self-pay

## 2020-09-17 VITALS — BP 146/94 | HR 70 | Temp 96.9°F | Wt 282.4 lb

## 2020-09-17 DIAGNOSIS — I10 Essential (primary) hypertension: Secondary | ICD-10-CM | POA: Diagnosis not present

## 2020-09-17 NOTE — Patient Instructions (Signed)
Reduce amlodipine to 5 mg Increase valsartan to 2 pills/day Stop hydrochlorothiazide since it is in the valsartan May take Tylenol 500 mg twice a day as needed Take one of your blood pressure pills in the evening preferably the metoprolol or amlodipine If you take the trip by car be sure to try and walk around the car at least once every hour

## 2020-09-17 NOTE — Progress Notes (Signed)
Provider:  Alain Honey, MD  Careteam: Patient Care Team: Lauree Chandler, NP as PCP - General (Geriatric Medicine)  PLACE OF SERVICE:  [place] Directive information    No Known Allergies  No chief complaint on file.    HPI: Patient is a 75 y.o. female complains today of swelling in her legs, bilateral.  Currently taking amlodipine 10 mg.  She has gone back and forth with 5 versus 10 mg recently.  She stopped amlodipine altogether several days ago.  Swelling has improved but blood pressure has gone up.  Swelling is bilateral.  She takes Eliquis for stroke prevention; has A. fib  Review of Systems:  Review of Systems  Cardiovascular:  Positive for leg swelling.  All other systems reviewed and are negative.  Past Medical History:  Diagnosis Date   Arthritis    self report   Bone fracture    Right Foot   H/O hernia repair    History of hysterectomy    History of mammogram    Dr.Elizabeth Gwenlyn Saran   History of MRI    Dr.Elizabeth Gwenlyn Saran.   History of removal of skin mole 03/09/1997   Hyperlipidemia    Hypertension    Scalp hematoma    Wears glasses    Past Surgical History:  Procedure Laterality Date   ABDOMINAL HYSTERECTOMY     BREAST BIOPSY  03/09/1997   Dr.Finder   CARDIOVERSION N/A 12/25/2019   Procedure: CARDIOVERSION;  Surgeon: Adrian Prows, MD;  Location: [facility] ENDOSCOPY;  Service: Cardiovascular;  Laterality: N/A;   CARDIOVERSION N/A 02/06/2020   Procedure: CARDIOVERSION;  Surgeon: Adrian Prows, MD;  [facility] ENDOSCOPY;  Service: Cardiovascular;  Laterality: N/A;   CHOLECYSTECTOMY     ~ 6 yrs ago   COLONOSCOPY  2009   Dr.Chesley   CYSTECTOMY  09/04/2019   Dr.Gray   GALLBLADDER SURGERY  03/10/2015   Dr.Amid ( [place] )   POLYPECTOMY     12 polyps removed at [place]  2016   Social History:   reports that she has never smoked. She has never used smokeless tobacco. She reports current alcohol use of about 1.0  standard drink of alcohol per week. She reports that she does not use drugs.  Family History  Problem Relation Age of Onset   Breast cancer Mother    Alzheimer's disease Mother    Dementia Mother    Parkinson's disease Mother    Colon polyps Mother    Stroke Father    Dementia Father    Breast cancer Sister    Allergies Daughter    Allergies Son    Breast cancer Maternal Aunt    Allergies Daughter    Colon cancer Neg Hx    Esophageal cancer Neg Hx    Stomach cancer Neg Hx    Rectal cancer Neg Hx     Medications: Patient's Medications  New Prescriptions   No medications on file  Previous Medications   AMIODARONE (PACERONE) 200 MG TABLET    Take 0.5 tablets (100 mg total) by mouth daily.   AMLODIPINE (NORVASC) 10 MG TABLET    Take 0.5 tablets (5 mg total) by mouth daily.   APIXABAN (ELIQUIS) 5 MG TABS TABLET    Take 1 tablet (5 mg total) by mouth 2 (two) times daily.   CHOLECALCIFEROL (VITAMIN D3) 25 MCG (1000 UT) CAPS    Take 1,000 Units by mouth daily.    HYDROCHLOROTHIAZIDE (MICROZIDE)  12.5 MG CAPSULE    TAKE 1 CAPSULE(12.5 MG) BY MOUTH DAILY   METOPROLOL SUCCINATE (TOPROL-XL) 25 MG 24 HR TABLET    Take 1 tablet (25 mg total) by mouth daily. Take with or immediately following a meal.   MULTIPLE VITAMINS-MINERALS (COMPLETE MULTIVITAMIN/MINERAL PO)    Take 1 tablet by mouth daily. [employer] Vitamin   OMEGA-3 KRILL OIL PO    Take 350 mg by mouth daily.    PSYLLIUM (METAMUCIL FIBER PO)    Take 10 mLs by mouth 3 (three) times a week. With water or Juice   ROSUVASTATIN (CRESTOR) 5 MG TABLET    Take 1 tablet (5 mg total) by mouth at bedtime. Fasting labs due prior to next refill   VALSARTAN-HYDROCHLOROTHIAZIDE (DIOVAN-HCT) 160-12.5 MG TABLET    TAKE 1 TABLET BY MOUTH DAILY  Modified Medications   No medications on file  Discontinued Medications   No medications on file    Physical Exam:  There were no vitals filed for this visit. There is no height or weight on file to  calculate BMI. Wt Readings from Last 3 Encounters:  06/05/20 279 lb (126.6 kg)  05/23/20 281 lb (127.5 kg)  04/17/20 280 lb 3.2 oz (127.1 kg)    Physical Exam Vitals and nursing note reviewed.  Constitutional:      Appearance: Normal appearance. She is obese.  Cardiovascular:     Rate and Rhythm: Normal rate and regular rhythm.  Pulmonary:     Effort: Pulmonary effort is normal.     Breath sounds: Normal breath sounds.  Musculoskeletal:     Right lower leg: Edema present.     Left lower leg: Edema present.  Neurological:     General: No focal deficit present.     Mental Status: She is alert and oriented to person, place, and time.    Labs reviewed: Basic Metabolic Panel: Recent Labs    10/04/19 1617 12/07/19 1424 02/06/20 1038 06/05/20 1202  NA 142 141 141 145*  K 3.7 4.0 3.5 4.1  CL 105 104 104 104  CO2 28 29  --  25  GLUCOSE 91 96 104* 84  BUN 9 13 9 13   CREATININE 0.94* 0.98* 0.90 0.98  CALCIUM 9.1 9.7  --  9.3  MG 1.9  --   --   --   TSH 0.95  --   --   --    Liver Function Tests: Recent Labs    12/07/19 1424 06/05/20 1202  AST 41* 33  ALT 43* 36*  ALKPHOS  --  72  BILITOT 0.7 0.4  PROT 6.8 6.9  ALBUMIN  --  4.1   No results for input(s): LIPASE, AMYLASE in the last 8760 hours. No results for input(s): AMMONIA in the last 8760 hours. CBC: Recent Labs    10/04/19 1617 12/07/19 1424 02/06/20 1038  WBC 6.8 5.7  --   NEUTROABS 4,332 3,734  --   HGB 12.7 13.0 13.6  HCT 40.6 40.5 40.0  MCV 84.1 83.9  --   PLT 211 215  --    Lipid Panel: Recent Labs    12/07/19 1424  CHOL 163  HDL 47*  LDLCALC 94  TRIG 123  CHOLHDL 3.5   TSH: Recent Labs    10/04/19 1617  TSH 0.95   A1C: No results found for: HGBA1C   Assessment/Plan  1) hypertension-blood pressure is elevated today but she has been off of amlodipine.  I believe her bilateral swelling probably  related to amlodipine.  She says that when she takes 5 mg she does not have swelling.   Plan to decrease amlodipine to 5 mg but increase valsartan with hydrochlorothiazide to 320 of valsartan and 25 hydrochlorothiazide.  We will discontinue the 12.5 mg Microzide   Alain Honey, MD [place] [phone]

## 2020-10-16 ENCOUNTER — Other Ambulatory Visit: Payer: Self-pay

## 2020-10-16 ENCOUNTER — Ambulatory Visit: Payer: Medicare Other | Admitting: Cardiology

## 2020-10-16 ENCOUNTER — Encounter: Payer: Self-pay | Admitting: Cardiology

## 2020-10-16 VITALS — BP 139/74 | HR 72 | Temp 97.2°F | Resp 17 | Ht 67.0 in | Wt 280.0 lb

## 2020-10-16 DIAGNOSIS — I480 Paroxysmal atrial fibrillation: Secondary | ICD-10-CM

## 2020-10-16 DIAGNOSIS — Z6841 Body Mass Index (BMI) 40.0 and over, adult: Secondary | ICD-10-CM

## 2020-10-16 DIAGNOSIS — I10 Essential (primary) hypertension: Secondary | ICD-10-CM

## 2020-10-16 DIAGNOSIS — R0609 Other forms of dyspnea: Secondary | ICD-10-CM

## 2020-10-16 DIAGNOSIS — R0600 Dyspnea, unspecified: Secondary | ICD-10-CM

## 2020-10-16 MED ORDER — VALSARTAN-HYDROCHLOROTHIAZIDE 320-25 MG PO TABS: 1.0000 | ORAL_TABLET | ORAL | 3 refills | Status: DC

## 2020-10-16 NOTE — Progress Notes (Signed)
Primary Physician/Referring:  Lauree Chandler, NP  Patient ID: Terry Koch, female    DOB: 03/17/1945, 75 y.o.   MRN: [id number]  Chief Complaint  Patient presents with  . Follow-up    6 MONTH  . Atrial Fibrillation  . Hypertension   HPI:    Terry Koch  is a 75 y.o. African-American female with hypertension, hyperlipidemia, obesity, colonic polyps requiring serial surveillance colonoscopy, no prior history of GI bleed underwent cardioversion 12/25/2019 was unsuccessful. Underwent repeat cardioversion with Amiodarone on board successfully to normal sinus rhythm 02/06/2020.  This is a 75-monthoffice visit, she is feeling the best she has in quite a while.  She had developed significant leg edema on amlodipine 10 mg, hence was reduced to 5 mg and valsartan HCT dose was increased by JSherrie Mustache NP.  Patient has noticed excellent blood pressure control and brings home blood pressure recordings.  She denies any specific symptoms of dyspnea, palpitations or chest pain today.  Leg edema has completely resolved.  Past Medical History:  Diagnosis Date  . Arthritis    self report  . Bone fracture    Right Foot  . H/O hernia repair   . History of hysterectomy   . History of mammogram    Dr.Elizabeth KGwenlyn Saran . History of MRI    Dr.Elizabeth KGwenlyn Saran  .Marland KitchenHistory of removal of skin mole 03/09/1997  . Hyperlipidemia   . Hypertension   . Scalp hematoma   . Wears glasses    Past Surgical History:  Procedure Laterality Date  . ABDOMINAL HYSTERECTOMY    . BREAST BIOPSY  03/09/1997   Dr.Finder  . CARDIOVERSION N/A 12/25/2019   Procedure: CARDIOVERSION;  Surgeon: GAdrian Prows MD;  [facility]  Service: Cardiovascular;  Laterality: N/A;  . CARDIOVERSION N/A 02/06/2020   Procedure: CARDIOVERSION;  Surgeon: GAdrian Prows MD;  [place]  Service: Cardiovascular;  Laterality: N/A;  . CHOLECYSTECTOMY     ~ 6 yrs ago  . COLONOSCOPY  2009   Dr.Chesley   . CYSTECTOMY  09/04/2019   Dr.Gray  . GALLBLADDER SURGERY  03/10/2015   Dr.Amid ([facility])  . POLYPECTOMY     12 polyps [facility]   Family History  Problem Relation Age of Onset  . Breast cancer Mother   . Alzheimer's disease Mother   . Dementia Mother   . Parkinson's disease Mother   . Colon polyps Mother   . Stroke Father   . Dementia Father   . Breast cancer Sister   . Allergies Daughter   . Allergies Daughter   . Allergies Son   . Colon cancer Neg Hx   . Esophageal cancer Neg Hx   . Stomach cancer Neg Hx   . Rectal cancer Neg Hx     Social History   Tobacco Use  . Smoking status: Never  . Smokeless tobacco: Never  Substance Use Topics  . Alcohol use: Yes    Alcohol/week: 1.0 standard drink    Types: 1 Glasses of wine per week    Comment: occa   Marital Status: Married  ROS  Review of Systems  Cardiovascular:  Negative for chest pain, dyspnea on exertion and leg swelling.  Gastrointestinal:  Negative for melena.  Objective  Blood pressure 139/74, pulse 72, temperature (!) 97.2 F (36.2 C), temperature source Temporal, resp. rate 17, height '5\' 7"'$  (1.702 m), weight 280 lb (127 kg), SpO2 99 %.  Vitals with BMI 10/16/2020 09/17/2020 06/05/2020  Height '5\' 7"'$  - '5\' 7"'$   Weight 280 lbs 282 lbs 6 oz 279 lbs  BMI [id number] - 123456  Systolic [id number] [id number]  Diastolic 74 94 66  Pulse 72 70 68     Physical Exam Vitals reviewed.  Constitutional:      Comments: Morbidly obese in no acute distress.  HENT:     Head: Normocephalic and atraumatic.  Neck:     Vascular: No carotid bruit or JVD.  Cardiovascular:     Rate and Rhythm: Normal rate and regular rhythm.     Pulses: Intact distal pulses.          Carotid pulses are 2+ on the right side and 2+ on the left side.      Dorsalis pedis pulses are 2+ on the right side and 2+ on the left side.       Posterior tibial pulses are 2+ on the right side and 2+ on the left side.      Heart sounds: Normal heart sounds, S1 normal and S2 normal. No murmur heard.   No gallop.     Comments: Femoral and popliteal pulse difficult to feel due to patient's body habitus.  Pulmonary:     Effort: Pulmonary effort is normal. No respiratory distress.     Breath sounds: Normal breath sounds. No wheezing, rhonchi or rales.  Abdominal:     General: Bowel sounds are normal.     Palpations: Abdomen is soft.     Comments: Obese. Pannus present  Musculoskeletal:     Cervical back: Neck supple.     Right lower leg: No edema.     Left lower leg: No edema.  Neurological:     Mental Status: She is alert.   Laboratory examination:   Recent Labs    12/07/19 1424 02/06/20 1038 06/05/20 1202  NA 141 141 145*  K 4.0 3.5 4.1  CL 104 104 104  CO2 29  --  25  GLUCOSE 96 104* 84  BUN '13 9 13  '$ CREATININE 0.98* 0.90 0.98  CALCIUM 9.7  --  9.3  GFRNONAA 57*  --   --   GFRAA 66  --   --    CrCl cannot be calculated (Patient's most recent lab result is older than the maximum 21 days allowed.).  CMP Latest Ref Rng & Units 06/05/2020 02/06/2020 12/07/2019  Glucose 65 - 99 mg/dL 84 104(H) 96  BUN 8 - 27 mg/dL '13 9 13  '$ Creatinine 0.57 - 1.00 mg/dL 0.98 0.90 0.98(H)  Sodium 134 - 144 mmol/L 145(H) 141 141  Potassium 3.5 - 5.2 mmol/L 4.1 3.5 4.0  Chloride 96 - 106 mmol/L 104 104 104  CO2 20 - 29 mmol/L 25 - 29  Calcium 8.7 - 10.3 mg/dL 9.3 - 9.7  Total Protein 6.0 - 8.5 g/dL 6.9 - 6.8  Total Bilirubin 0.0 - 1.2 mg/dL 0.4 - 0.7  Alkaline Phos 44 - 121 IU/L 72 - -  AST 0 - 40 IU/L 33 - 41(H)  ALT 0 - 32 IU/L 36(H) - 43(H)   CBC Latest Ref Rng & Units 02/06/2020 12/07/2019 10/04/2019  WBC 3.8 - 10.8 Thousand/uL - 5.7 6.8  Hemoglobin 12.0 - 15.0 g/dL 13.6 13.0 12.7  Hematocrit 36.0 - 46.0 % 40.0 40.5 40.6  Platelets 140 - 400 Thousand/uL - 215 211    Lipid Panel Recent Labs    12/07/19 1424  CHOL 163  TRIG 123  LDLCALC 94  HDL 47*  CHOLHDL 3.5    HEMOGLOBIN A1C No results found  for: HGBA1C, MPG TSH No results for input(s): TSH in the last 8760 hours.  Medications and allergies  No Known Allergies   Outpatient Medications Prior to Visit  Medication Sig Dispense Refill  . amiodarone (PACERONE) 200 MG tablet Take 0.5 tablets (100 mg total) by mouth daily. 90 tablet 3  . amLODipine (NORVASC) 10 MG tablet Take 0.5 tablets (5 mg total) by mouth daily. 90 tablet 3  . apixaban (ELIQUIS) 5 MG TABS tablet Take 1 tablet (5 mg total) by mouth 2 (two) times daily. 180 tablet 3  . Ascorbic Acid (VITAMIN C) 1000 MG tablet Take 1,000 mg by mouth daily.    . Cholecalciferol (VITAMIN D3) 25 MCG (1000 UT) CAPS Take 1,000 Units by mouth daily.     . metoprolol succinate (TOPROL-XL) 25 MG 24 hr tablet Take 1 tablet (25 mg total) by mouth daily. Take with or immediately following a meal. 90 tablet 3  . Misc Natural Products (ELDERBERRY IMMUNE COMPLEX) CHEW Chew 50 mg by mouth daily.    . Multiple Vitamins-Minerals (COMPLETE MULTIVITAMIN/MINERAL PO) Take 1 tablet by mouth daily. [employer]'s Vitamin    . NON FORMULARY Apply 1 application topically at bedtime. Veterinary liniment gel    . NON FORMULARY Apply 1 application topically as needed. Magne sport balm    . NON FORMULARY Apply 1 application topically as needed. Body balm with magnesium '12mg'$     . OMEGA-3 KRILL OIL PO Take 350 mg by mouth daily.     . Psyllium (METAMUCIL FIBER PO) Take 10 mLs by mouth 3 (three) times a week. With water or Juice    . rosuvastatin (CRESTOR) 5 MG tablet Take 1 tablet (5 mg total) by mouth at bedtime. Fasting labs due prior to next refill 90 tablet 0  . vitamin B-12 (CYANOCOBALAMIN) 1000 MCG tablet Take 1,000 mcg by mouth daily.    . Zinc 50 MG TABS Take 1 tablet by mouth daily.    Marland Kitchen diltiazem (DILACOR XR) 180 MG 24 hr capsule Take 180 mg by mouth daily.    . valsartan-hydrochlorothiazide (DIOVAN-HCT) 160-12.5 MG tablet TAKE 1 TABLET BY MOUTH DAILY (Patient taking differently: Take 2 tablets by mouth  daily.) 90 tablet 0  . hydrochlorothiazide (MICROZIDE) 12.5 MG capsule TAKE 1 CAPSULE(12.5 MG) BY MOUTH DAILY 90 capsule 0   No facility-administered medications prior to visit.   Medications after today's encounter: Current Outpatient Medications  Medication Instructions  . amiodarone (PACERONE) 100 mg, Oral, Daily  . amLODipine (NORVASC) 5 mg, Oral, Daily  . apixaban (ELIQUIS) 5 mg, Oral, 2 times daily  . metoprolol succinate (TOPROL-XL) 25 mg, Oral, Daily, Take with or immediately following a meal.  . Misc Natural Products (ELDERBERRY IMMUNE COMPLEX) CHEW 50 mg, Oral, Daily  . Multiple Vitamins-Minerals (COMPLETE MULTIVITAMIN/MINERAL PO) 1 tablet, Oral, Daily, [employer] Women's Vitamin  . NON FORMULARY 1 application, Topical, Nightly, Veterinary liniment gel  . NON FORMULARY 1 application, Topical, As needed, Magne sport balm  . NON FORMULARY 1 application, Topical, As needed, Body balm with magnesium '12mg'$   . OMEGA-3 KRILL OIL PO 350 mg, Oral, Daily  . Psyllium (METAMUCIL FIBER PO) 10 mLs, Oral, 3 times weekly, With water or Juice  . rosuvastatin (CRESTOR) 5 mg, Oral, Daily at bedtime, Fasting labs due prior to next refill  . valsartan-hydrochlorothiazide (DIOVAN-HCT) 320-25 MG tablet 1 tablet, Oral, BH-each morning  . vitamin B-12 (CYANOCOBALAMIN)  1,000 mcg, Oral, Daily  . vitamin C 1,000 mg, Oral, Daily  . Vitamin D3 1,000 Units, Oral, Daily  . Zinc 50 MG TABS 1 tablet, Oral, Daily    Radiology:   No results found.  Cardiac Studies:   Exercise Myoview stress test 10/18/2019: Exercise nuclear stress test was performed using Bruce protocol. Patient reached 5.1 METS, and 98% of age predicted maximum heart rate. Exercise capacity was low. No chest pain reported. Heart rate and hemodynamic response were normal. Stress EKG revealed no ischemic changes. Normal myocardial perfusion. Stress LVEF calculated 49%, although visually appears normal. Low risk study.  Echocardiogram 10/25/2019:   Left ventricle cavity is normal in size. Mild concentric hypertrophy of  the left ventricle. Normal global wall motion. Normal LV systolic function with EF 55%. Doppler evidence of grade II (pseudonormal) diastolic dysfunction.  Left atrial cavity is severely dilated. Integrity of interatrial septum could not be adequately assessed to limited visualization. Consider  alternate imaging study, if clinically indicated.  Trileaflet aortic valve.  Trace aortic regurgitation.  Mild tricuspid regurgitation. No evidence of pulmonary hypertension.  Cardioversion 12/25/2019:  Indication symptomatic A. Fibrillation. Procedure: Using 90 mg of IV Propofol and 100 IV Lidocaine (for reducing venous pain) for achieving deep sedation, synchronized direct current cardioversion performed. Patient was delivered with 150 x 1 then 200 Joules of electricity X 3 without success. Patient tolerated the procedure well. No immediate complication noted.    Direct current cardioversion 02/06/20 : Indication symptomatic A. Fibrillation. Procedure: Using 100 mg of IV Propofol and 40 IV Lidocaine (for reducing venous pain) for achieving deep sedation, synchronized direct current cardioversion performed. Patient was delivered with 200J x 1 then 350 Joules of electricity X 1 with success to NSR. Patient tolerated the procedure well. No immediate complication noted.    EKG:   EKG 10/16/2020: Normal sinus rhythm at rate of 61 bpm, left axis deviation, poor R wave progression, cannot exclude anteroseptal infarct old.  No evidence of ischemia, normal QT interval.  No significant change from 04/17/2020.  EKG 01/02/2020: Atrial fibrillation with controlled ventricular response at the rate of 74 bpm, leftward axis, poor R wave progression, cannot exclude anteroseptal infarct old.  Nonspecific T abnormality.  No significant change from 11/23/2019.  Assessment     ICD-10-CM   1. Paroxysmal atrial fibrillation (HCC)  I48.0 EKG 12-Lead     2. Dyspnea on exertion  R06.00     3. Primary hypertension  I10 valsartan-hydrochlorothiazide (DIOVAN-HCT) 320-25 MG tablet    Type and screen    Basic Metabolic Panel (BMET)    4. Class 3 severe obesity due to excess calories with serious comorbidity and body mass index (BMI) of 40.0 to 44.9 in adult (HCC)  E66.01    Z68.41        CHA2DS2-VASc Score is 4.  Yearly risk of stroke: 4.8% (A, F, HTN).   Medications Discontinued During This Encounter  Medication Reason  . hydrochlorothiazide (MICROZIDE) 12.5 MG capsule Error  . diltiazem (DILACOR XR) 180 MG 24 hr capsule Error  . valsartan-hydrochlorothiazide (DIOVAN-HCT) 160-12.5 MG tablet Dose change    Meds ordered this encounter  Medications  . valsartan-hydrochlorothiazide (DIOVAN-HCT) 320-25 MG tablet    Sig: Take 1 tablet by mouth every morning.    Dispense:  90 tablet    Refill:  3   Recommendations:   Terry Koch is a 75 y.o. African-American female with hypertension, hyperlipidemia, obesity, colonic polyps requiring serial surveillance colonoscopy, no prior history of GI  bleed underwent cardioversion 12/25/2019 was unsuccessful. Underwent repeat cardioversion with Amiodarone on board successfully to normal sinus rhythm 02/06/2020.  This is 48-monthoffice visit, she is presently tolerating all her medications well.  Amlodipine dose was reduced to 5 mg due to leg edema and valsartan was increased from 160/12.5 mg to 320/25 mg  recent leg edema by her PCP with excellent control of her blood pressure.  Home recordings have been around [id number] systolic/70 to 75 mmHg diastolic.  Hence I did not make any changes to her medications.  She is maintaining sinus rhythm on low-dose of amiodarone at 100 mg daily.  She is tolerating anticoagulation with Eliquis without bleeding diathesis.  Renal function and CBC has remained stable.  Overall she remains stable from cardiac standpoint, I will see her back on an annual basis.  Weight  loss was again discussed with the patient.     JAdrian Prows PA-C 10/16/2020, 2:49 PM Office: [phone]

## 2020-10-18 ENCOUNTER — Other Ambulatory Visit: Payer: Self-pay

## 2020-10-18 ENCOUNTER — Encounter: Payer: Self-pay | Admitting: Nurse Practitioner

## 2020-10-18 ENCOUNTER — Ambulatory Visit (INDEPENDENT_AMBULATORY_CARE_PROVIDER_SITE_OTHER): Payer: Medicare Other | Admitting: Nurse Practitioner

## 2020-10-18 VITALS — BP 132/78 | Temp 97.0°F | Ht 67.0 in | Wt 281.6 lb

## 2020-10-18 DIAGNOSIS — M1711 Unilateral primary osteoarthritis, right knee: Secondary | ICD-10-CM | POA: Diagnosis not present

## 2020-10-18 DIAGNOSIS — I4819 Other persistent atrial fibrillation: Secondary | ICD-10-CM

## 2020-10-18 DIAGNOSIS — R600 Localized edema: Secondary | ICD-10-CM

## 2020-10-18 DIAGNOSIS — Z1231 Encounter for screening mammogram for malignant neoplasm of breast: Secondary | ICD-10-CM | POA: Diagnosis not present

## 2020-10-18 DIAGNOSIS — E782 Mixed hyperlipidemia: Secondary | ICD-10-CM

## 2020-10-18 DIAGNOSIS — I10 Essential (primary) hypertension: Secondary | ICD-10-CM

## 2020-10-18 DIAGNOSIS — U071 COVID-19: Secondary | ICD-10-CM

## 2020-10-18 NOTE — Progress Notes (Signed)
Careteam: Patient Care Team: Lauree Chandler, NP as PCP - General (Geriatric Medicine)  PLACE OF SERVICE:  [place] Directive information    No Known Allergies  Chief Complaint  Patient presents with   Acute Visit    Patient having leg swelling which has improved. She has been using topical creams and Tylenol for pain.     HPI: Patient is a 75 y.o. female for routine follow up.  She got COVID with her husband.  She had mild symptoms. Runny nose, cough and she was weak and tired. She normal has high energy.   Had a bad experience with her last mammogram. 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   Pain in right knee- overall getting better but still having some pain. Using tylenol.   Review of Systems:  Review of Systems  Constitutional:  Negative for chills, fever and weight loss.  HENT:  Negative for tinnitus.   Respiratory:  Positive for cough. Negative for sputum production and shortness of breath.   Cardiovascular:  Negative for chest pain, palpitations and leg swelling.  Gastrointestinal:  Negative for abdominal pain, constipation, diarrhea and heartburn.  Genitourinary:  Negative for dysuria, frequency and urgency.  Musculoskeletal:  Positive for joint pain. Negative for back pain, falls and myalgias.  Skin: Negative.   Neurological:  Negative for dizziness and headaches.  Psychiatric/Behavioral:  Negative for depression and memory loss. The patient does not have insomnia.    Past Medical History:  Diagnosis Date   Arthritis    self report   Bone fracture    Right Foot   H/O hernia repair    History of hysterectomy    History of mammogram    Dr.Elizabeth Gwenlyn Saran   History of MRI    Dr.Elizabeth Gwenlyn Saran.   History of removal of skin mole 03/09/1997   Hyperlipidemia    Hypertension    Scalp hematoma    Wears glasses    Past Surgical History:   Procedure Laterality Date   ABDOMINAL HYSTERECTOMY     BREAST BIOPSY  03/09/1997   Dr.Finder   CARDIOVERSION N/A 12/25/2019   Procedure: CARDIOVERSION;  Surgeon: Adrian Prows, MD;  [facility];  Service: Cardiovascular;  Laterality: N/A;   CARDIOVERSION N/A 02/06/2020   Procedure: CARDIOVERSION;  Surgeon: Adrian Prows, MD;  Location: [facility] ENDOSCOPY;  Service: Cardiovascular;  Laterality: N/A;   CHOLECYSTECTOMY     ~ 6 yrs ago   COLONOSCOPY  2009   Dr.Chesley   CYSTECTOMY  09/04/2019   Dr.Gray   GALLBLADDER SURGERY  03/10/2015   Dr.Amid ( [place] )   POLYPECTOMY     12 polyps removed at [place]  2016   Social History:   reports that she has never smoked. She has never used smokeless tobacco. She reports current alcohol use of about 1.0 standard drink per week. She reports that she does not use drugs.  Family History  Problem Relation Age of Onset   Breast cancer Mother    Alzheimer's disease Mother    Dementia Mother    Parkinson's disease Mother    Colon polyps Mother    Stroke Father    Dementia Father    Breast cancer Sister    Allergies Daughter    Allergies Daughter    Allergies Son    Colon cancer Neg Hx  Esophageal cancer Neg Hx    Stomach cancer Neg Hx    Rectal cancer Neg Hx     Medications: Patient's Medications  New Prescriptions   No medications on file  Previous Medications   AMIODARONE (PACERONE) 200 MG TABLET    Take 0.5 tablets (100 mg total) by mouth daily.   AMLODIPINE (NORVASC) 10 MG TABLET    Take 0.5 tablets (5 mg total) by mouth daily.   APIXABAN (ELIQUIS) 5 MG TABS TABLET    Take 1 tablet (5 mg total) by mouth 2 (two) times daily.   ASCORBIC ACID (VITAMIN C) 1000 MG TABLET    Take 1,000 mg by mouth daily.   CHOLECALCIFEROL (VITAMIN D3) 25 MCG (1000 UT) CAPS    Take 1,000 Units by mouth daily.    METOPROLOL SUCCINATE (TOPROL-XL) 25 MG 24 HR TABLET    Take 1 tablet (25 mg total) by mouth daily. Take with or  immediately following a meal.   MISC NATURAL PRODUCTS (ELDERBERRY IMMUNE COMPLEX) CHEW    Chew 50 mg by mouth daily.   MULTIPLE VITAMINS-MINERALS (COMPLETE MULTIVITAMIN/MINERAL PO)    Take 1 tablet by mouth daily. [employer] Women's Vitamin   NON FORMULARY    Apply 1 application topically at bedtime. Veterinary liniment gel   NON FORMULARY    Apply 1 application topically as needed. Magne sport balm   NON FORMULARY    Apply 1 application topically as needed. Body balm with magnesium 54m   OMEGA-3 KRILL OIL PO    Take 350 mg by mouth daily.    PSYLLIUM (METAMUCIL FIBER PO)    Take 10 mLs by mouth 3 (three) times a week. With water or Juice   ROSUVASTATIN (CRESTOR) 5 MG TABLET    Take 1 tablet (5 mg total) by mouth at bedtime. Fasting labs due prior to next refill   VALSARTAN-HYDROCHLOROTHIAZIDE (DIOVAN-HCT) 320-25 MG TABLET    Take 1 tablet by mouth every morning.   VITAMIN B-12 (CYANOCOBALAMIN) 1000 MCG TABLET    Take 1,000 mcg by mouth daily.   ZINC 50 MG TABS    Take 1 tablet by mouth daily.  Modified Medications   No medications on file  Discontinued Medications   No medications on file    Physical Exam:  Vitals:   10/18/20 1119  BP: 132/78  Temp: (!) 97 F (36.1 C)  TempSrc: Temporal  SpO2: 98%  Weight: 281 lb 9.6 oz (127.7 kg)  Height: 5' 7"  (1.702 m)   Body mass index is 44.1 kg/m. Wt Readings from Last 3 Encounters:  10/18/20 281 lb 9.6 oz (127.7 kg)  10/16/20 280 lb (127 kg)  09/17/20 282 lb 6.4 oz (128.1 kg)    Physical Exam Constitutional:      General: She is not in acute distress.    Appearance: She is well-developed. She is not diaphoretic.  HENT:     Head: Normocephalic and atraumatic.     Mouth/Throat:     Pharynx: No oropharyngeal exudate.  Eyes:     Conjunctiva/sclera: Conjunctivae normal.     Pupils: Pupils are equal, round, and reactive to light.  Cardiovascular:     Rate and Rhythm: Normal rate and regular rhythm.     Heart sounds: Normal heart  sounds.  Pulmonary:     Effort: Pulmonary effort is normal.     Breath sounds: Normal breath sounds.  Abdominal:     General: Bowel sounds are normal.     Palpations: Abdomen is soft.  Musculoskeletal:     Cervical back: Normal range of motion and neck supple.     Right lower leg: No edema.     Left lower leg: No edema.  Skin:    General: Skin is warm and dry.  Neurological:     Mental Status: She is alert.  Psychiatric:        Mood and Affect: Mood normal.    Labs reviewed: Basic Metabolic Panel: Recent Labs    12/07/19 1424 02/06/20 1038 06/05/20 1202  NA 141 141 145*  K 4.0 3.5 4.1  CL 104 104 104  CO2 29  --  25  GLUCOSE 96 104* 84  BUN 13 9 13   CREATININE 0.98* 0.90 0.98  CALCIUM 9.7  --  9.3   Liver Function Tests: Recent Labs    12/07/19 1424 06/05/20 1202  AST 41* 33  ALT 43* 36*  ALKPHOS  --  72  BILITOT 0.7 0.4  PROT 6.8 6.9  ALBUMIN  --  4.1   No results for input(s): LIPASE, AMYLASE in the last 8760 hours. No results for input(s): AMMONIA in the last 8760 hours. CBC: Recent Labs    12/07/19 1424 02/06/20 1038  WBC 5.7  --   NEUTROABS 3,734  --   HGB 13.0 13.6  HCT 40.5 40.0  MCV 83.9  --   PLT 215  --    Lipid Panel: Recent Labs    12/07/19 1424  CHOL 163  HDL 47*  LDLCALC 94  TRIG 123  CHOLHDL 3.5   TSH: No results for input(s): TSH in the last 8760 hours. A1C: No results found for: HGBA1C   Assessment/Plan 1. Encounter for screening mammogram for malignant neoplasm of breast - MM Digital Screening; Future  2. Essential hypertension Stable with adjustments in medication. Will continue current regimen. Dietary modifications encouraged.  - CBC with Differential/Platelet - CMP with eGFR([facility])  3. Mixed hyperlipidemia Continues on crestor. Encouraged lifestyle modification.  - Lipid panel  4. Primary osteoarthritis of right knee Doing better now. Using tylenol and muscle rub for pain control. Also can use ice.  Encouraged weight loss as well to help overall.   5. COVID-19 -tested positive 10 days ago, doing well at this time. Energy slowly returning. Has a post viral cough that is slowly improving.  6. Morbid obesity ([place]) -education provided on healthy weight loss through increase in physical activity and proper nutrition.  7. Persistent atrial fibrillation (HCC) Stable, followed by cardiology. Continues on eliquis for anticoagulation and metoprolol with amiodarone for rate controlled.   8. Leg edema Improved with reduction of amlodipine.    Next appt: 6 months Irys Nigh K. [place], [facility] [phone]

## 2020-10-19 LAB — CBC WITH DIFFERENTIAL/PLATELET
Absolute Monocytes: 462 cells/uL (ref 200–950)
Basophils Absolute: 33 cells/uL (ref 0–200)
Basophils Relative: 0.5 %
Eosinophils Absolute: 143 cells/uL (ref 15–500)
Eosinophils Relative: 2.2 %
HCT: 36.2 % (ref 35.0–45.0)
Hemoglobin: 11.8 g/dL (ref 11.7–15.5)
Lymphs Abs: 1671 cells/uL (ref 850–3900)
MCH: 27.3 pg (ref 27.0–33.0)
MCHC: 32.6 g/dL (ref 32.0–36.0)
MCV: 83.8 fL (ref 80.0–100.0)
MPV: 10.3 fL (ref 7.5–12.5)
Monocytes Relative: 7.1 %
Neutro Abs: 4193 cells/uL (ref 1500–7800)
Neutrophils Relative %: 64.5 %
Platelets: 235 10*3/uL (ref 140–400)
RBC: 4.32 10*6/uL (ref 3.80–5.10)
RDW: 13.8 % (ref 11.0–15.0)
Total Lymphocyte: 25.7 %
WBC: 6.5 10*3/uL (ref 3.8–10.8)

## 2020-10-19 LAB — LIPID PANEL
Cholesterol: 151 mg/dL (ref <200)
HDL: 40 mg/dL — ABNORMAL LOW (ref >=50)
LDL Cholesterol (Calc): 85 mg/dL (calc)
Non-HDL Cholesterol (Calc): 111 mg/dL (calc) (ref <130)
Total CHOL/HDL Ratio: 3.8 (calc) (ref <5.0)
Triglycerides: 165 mg/dL — ABNORMAL HIGH (ref <150)

## 2020-10-19 LAB — COMPLETE METABOLIC PANEL WITH GFR
AG Ratio: 1.5 (calc) (ref 1.0–2.5)
ALT: 23 U/L (ref 6–29)
AST: 26 U/L (ref 10–35)
Albumin: 4.1 g/dL (ref 3.6–5.1)
Alkaline phosphatase (APISO): 61 U/L (ref 37–153)
BUN: 14 mg/dL (ref 7–25)
CO2: 28 mmol/L (ref 20–32)
Calcium: 9.3 mg/dL (ref 8.6–10.4)
Chloride: 104 mmol/L (ref 98–110)
Creat: 0.85 mg/dL (ref 0.60–1.00)
Globulin: 2.7 g/dL (calc) (ref 1.9–3.7)
Glucose, Bld: 105 mg/dL — ABNORMAL HIGH (ref 65–99)
Potassium: 3.9 mmol/L (ref 3.5–5.3)
Sodium: 140 mmol/L (ref 135–146)
Total Bilirubin: 0.4 mg/dL (ref 0.2–1.2)
Total Protein: 6.8 g/dL (ref 6.1–8.1)
eGFR: 71 mL/min/{1.73_m2} (ref >=60)

## 2020-10-21 ENCOUNTER — Other Ambulatory Visit: Payer: Self-pay | Admitting: Nurse Practitioner

## 2020-10-21 DIAGNOSIS — M25561 Pain in right knee: Secondary | ICD-10-CM

## 2020-10-29 ENCOUNTER — Other Ambulatory Visit: Payer: Self-pay

## 2020-10-29 ENCOUNTER — Ambulatory Visit
Admission: RE | Admit: 2020-10-29 | Discharge: 2020-10-29 | Disposition: A | Discharge status: Home / Self Care | Payer: Medicare Other | Source: Ambulatory Visit | Attending: Nurse Practitioner | Admitting: Nurse Practitioner

## 2020-10-29 DIAGNOSIS — M25561 Pain in right knee: Secondary | ICD-10-CM

## 2020-10-31 ENCOUNTER — Other Ambulatory Visit: Payer: Self-pay | Admitting: Nurse Practitioner

## 2020-10-31 DIAGNOSIS — M1711 Unilateral primary osteoarthritis, right knee: Secondary | ICD-10-CM

## 2020-11-07 ENCOUNTER — Ambulatory Visit (INDEPENDENT_AMBULATORY_CARE_PROVIDER_SITE_OTHER): Payer: Medicare Other | Admitting: Orthopedic Surgery

## 2020-11-07 ENCOUNTER — Other Ambulatory Visit: Payer: Self-pay

## 2020-11-07 DIAGNOSIS — M25511 Pain in right shoulder: Secondary | ICD-10-CM

## 2020-11-07 DIAGNOSIS — M1711 Unilateral primary osteoarthritis, right knee: Secondary | ICD-10-CM

## 2020-11-12 ENCOUNTER — Encounter: Payer: Self-pay | Admitting: Orthopedic Surgery

## 2020-11-12 DIAGNOSIS — M25511 Pain in right shoulder: Secondary | ICD-10-CM | POA: Diagnosis not present

## 2020-11-12 DIAGNOSIS — M1711 Unilateral primary osteoarthritis, right knee: Secondary | ICD-10-CM

## 2020-11-12 MED ORDER — LIDOCAINE HCL 1 % IJ SOLN
5.0000 mL | INTRAMUSCULAR | Status: AC | PRN
  Administered 2020-11-12: 5 mL

## 2020-11-12 MED ORDER — METHYLPREDNISOLONE ACETATE 40 MG/ML IJ SUSP
40.0000 mg | INTRAMUSCULAR | Status: AC | PRN
  Administered 2020-11-12: 40 mg via INTRA_ARTICULAR

## 2020-11-12 NOTE — Progress Notes (Signed)
Office Visit Note   Patient: Terry Koch           Date of Birth: 06/09/1945           MRN: [id number] Visit Date: 11/07/2020              Requested by: Lauree Chandler, NP [place],  [place] [address] PCP: Lauree Chandler, NP  Chief Complaint  Patient presents with   Right Knee - Pain      HPI: Patient is a 75 year old woman who presents complaining of pain in the right shoulder and right knee.  She states she has pain in the posterior aspect of the right knee radiographs were obtained August 23.  She denies any popping but states she does have giving way.  She has had 4-5 falls in the past 2 years.  Patient also complains of pain in the right shoulder with numbness on the top of the right thigh and pain into her fingers.  Patient is on Eliquis for A. fib.  BMI greater than 44.  Assessment & Plan: Visit Diagnoses:  1. Unilateral primary osteoarthritis, right knee   2. Acute pain of right shoulder     Plan: Recommended exercise and diet for weight control.  The shoulder was injected.  Reevaluate in 4 weeks.  Follow-Up Instructions: Return in about 4 weeks (around 12/05/2020).   Ortho Exam  Patient is alert, oriented, no adenopathy, well-dressed, normal affect, normal respiratory effort. Examination review of the radiographs shows tricompartmental arthritis of the right knee there is calcification of the meniscus bony spurs with some mild calcification of the popliteal arteries.  Examination of the right shoulder she has abduction and flexion to 90 degrees she has pain to palpation over the biceps tendon pain with Neer and Hawkins impingement test.  Imaging: No results found. No images are attached to the encounter.  Labs: No results found for: HGBA1C, ESRSEDRATE, CRP, LABURIC, REPTSTATUS, GRAMSTAIN, CULT, LABORGA   Lab Results  Component Value Date   ALBUMIN 4.1 06/05/2020    Lab Results  Component Value Date   MG 1.9 10/04/2019    No results found for: VD25OH  No results found for: PREALBUMIN CBC EXTENDED Latest Ref Rng & Units 10/18/2020 02/06/2020 12/07/2019  WBC 3.8 - 10.8 Thousand/uL 6.5 - 5.7  RBC 3.80 - 5.10 Million/uL 4.32 - 4.83  HGB 11.7 - 15.5 g/dL 11.8 13.6 13.0  HCT 35.0 - 45.0 % 36.2 40.0 40.5  PLT 140 - 400 Thousand/uL 235 - 215  NEUTROABS 1,500 - 7,800 cells/uL 4,193 - 3,734  LYMPHSABS 850 - 3,900 cells/uL 1,671 - 1,442     There is no height or weight on file to calculate BMI.  Orders:  No orders of the defined types were placed in this encounter.  No orders of the defined types were placed in this encounter.    Procedures: Large Joint Inj: R subacromial bursa on 11/12/2020 11:34 AM Indications: diagnostic evaluation and pain Details: 22 G 1.5 in needle, posterior approach  Arthrogram: No  Medications: 5 mL lidocaine 1 %; 40 mg methylPREDNISolone acetate 40 MG/ML Outcome: tolerated well, no immediate complications Procedure, treatment alternatives, risks and benefits explained, specific risks discussed. Consent was given by the patient. Immediately prior to procedure a time out was called to verify the correct patient, procedure, equipment, support staff and site/side marked as required. Patient was prepped and draped in the usual sterile fashion.     Clinical Data: No additional  findings.  ROS:  All other systems negative, except as noted in the HPI. Review of Systems  Objective: Vital Signs: There were no vitals taken for this visit.  Specialty Comments:  No specialty comments available.  PMFS History: Patient Active Problem List   Diagnosis Date Noted   Persistent atrial fibrillation [facility])    Past Medical History:  Diagnosis Date   Arthritis    self report   Bone fracture    Right Foot   H/O hernia repair    History of hysterectomy    History of mammogram    Dr.Elizabeth Gwenlyn Saran   History of MRI    Dr.Elizabeth Gwenlyn Saran.   History of removal of skin mole 03/09/1997    Hyperlipidemia    Hypertension    Scalp hematoma    Wears glasses     Family History  Problem Relation Age of Onset   Breast cancer Mother    Alzheimer's disease Mother    Dementia Mother    Parkinson's disease Mother    Colon polyps Mother    Stroke Father    Dementia Father    Breast cancer Sister    Allergies Daughter    Allergies Daughter    Allergies Son    Colon cancer Neg Hx    Esophageal cancer Neg Hx    Stomach cancer Neg Hx    Rectal cancer Neg Hx     Past Surgical History:  Procedure Laterality Date   ABDOMINAL HYSTERECTOMY     BREAST BIOPSY  03/09/1997   Dr.Finder   CARDIOVERSION N/A 12/25/2019   Procedure: CARDIOVERSION;  Surgeon: Adrian Prows, MD;  Location: [facility] ENDOSCOPY;  Service: Cardiovascular;  Laterality: N/A;   CARDIOVERSION N/A 02/06/2020   Procedure: CARDIOVERSION;  Surgeon: Adrian Prows, MD;  Location: [facility] ENDOSCOPY;  Service: Cardiovascular;  Laterality: N/A;   CHOLECYSTECTOMY     ~ 6 yrs ago   COLONOSCOPY  2009   Dr.Chesley   CYSTECTOMY  09/04/2019   Dr.Gray   GALLBLADDER SURGERY  03/10/2015   Dr.Amid ( [place] )   POLYPECTOMY     12 polyps removed at [place]  2016   Social History   Occupational History   Not on file  Tobacco Use   Smoking status: Never   Smokeless tobacco: Never  Vaping Use   Vaping Use: Never used  Substance and Sexual Activity   Alcohol use: Yes    Alcohol/week: 1.0 standard drink    Types: 1 Glasses of wine per week    Comment: occa   Drug use: Never   Sexual activity: Not Currently

## 2020-12-05 ENCOUNTER — Other Ambulatory Visit: Payer: Self-pay

## 2020-12-05 ENCOUNTER — Encounter: Payer: Self-pay | Admitting: Orthopedic Surgery

## 2020-12-05 ENCOUNTER — Ambulatory Visit (INDEPENDENT_AMBULATORY_CARE_PROVIDER_SITE_OTHER): Payer: Medicare Other | Admitting: Orthopedic Surgery

## 2020-12-05 DIAGNOSIS — M25511 Pain in right shoulder: Secondary | ICD-10-CM | POA: Diagnosis not present

## 2020-12-05 DIAGNOSIS — M1711 Unilateral primary osteoarthritis, right knee: Secondary | ICD-10-CM | POA: Diagnosis not present

## 2020-12-05 NOTE — Progress Notes (Signed)
Office Visit Note   Patient: Terry Koch           Date of Birth: 1946/01/16           MRN: [id number] Visit Date: 12/05/2020              Requested by: Lauree Chandler, NP [place],  [place] [address] PCP: Lauree Chandler, NP  Chief Complaint  Patient presents with   Right Knee - Pain, Follow-up    Is post injection for her right knee she states she had good relief with the injection.  She states she has had chronic shoulder and back pain that is seem to flareup at this time.  She states she does have start up stiffness she uses Tylenol and topical gels.  Patient states she is status post 2 cardioversions.  HPI: Patient is a 75 year old woman who presents  Assessment & Plan: Visit Diagnoses:  1. Unilateral primary osteoarthritis, right knee   2. Acute pain of right shoulder     Plan: Recommended avoiding nonsteroidals secondary to her cardiac issues.  She has mild venous swelling in both legs and feel that if she proceeds with exercise and strength training this may help both her joints and the swelling.  She will follow-up as needed for repeat injection in her right knee.  Follow-Up Instructions: Return if symptoms worsen or fail to improve.   Ortho Exam  Patient is alert, oriented, no adenopathy, well-dressed, normal affect, normal respiratory effort. Examination patient has pain-free passive range of motion of her shoulders and knees.  She does have mild venous stasis swelling in both legs with some mild pitting edema.  Imaging: No results found. No images are attached to the encounter.  Labs: No results found for: HGBA1C, ESRSEDRATE, CRP, LABURIC, REPTSTATUS, GRAMSTAIN, CULT, LABORGA   Lab Results  Component Value Date   ALBUMIN 4.1 06/05/2020    Lab Results  Component Value Date   MG 1.9 10/04/2019   No results found for: VD25OH  No results found for: PREALBUMIN CBC EXTENDED Latest Ref Rng & Units 10/18/2020 02/06/2020  12/07/2019  WBC 3.8 - 10.8 Thousand/uL 6.5 - 5.7  RBC 3.80 - 5.10 Million/uL 4.32 - 4.83  HGB 11.7 - 15.5 g/dL 11.8 13.6 13.0  HCT 35.0 - 45.0 % 36.2 40.0 40.5  PLT 140 - 400 Thousand/uL 235 - 215  NEUTROABS 1,500 - 7,800 cells/uL 4,193 - 3,734  LYMPHSABS 850 - 3,900 cells/uL 1,671 - 1,442     There is no height or weight on file to calculate BMI.  Orders:  No orders of the defined types were placed in this encounter.  No orders of the defined types were placed in this encounter.    Procedures: No procedures performed  Clinical Data: No additional findings.  ROS:  All other systems negative, except as noted in the HPI. Review of Systems  Objective: Vital Signs: There were no vitals taken for this visit.  Specialty Comments:  No specialty comments available.  PMFS History: Patient Active Problem List   Diagnosis Date Noted   Persistent atrial fibrillation [facility])    Past Medical History:  Diagnosis Date   Arthritis    self report   Bone fracture    Right Foot   H/O hernia repair    History of hysterectomy    History of mammogram    Dr.Elizabeth Gwenlyn Saran   History of MRI    Dr.Elizabeth Gwenlyn Saran.   History of removal of  skin mole 03/09/1997   Hyperlipidemia    Hypertension    Scalp hematoma    Wears glasses     Family History  Problem Relation Age of Onset   Breast cancer Mother    Alzheimer's disease Mother    Dementia Mother    Parkinson's disease Mother    Colon polyps Mother    Stroke Father    Dementia Father    Breast cancer Sister    Allergies Daughter    Allergies Daughter    Allergies Son    Colon cancer Neg Hx    Esophageal cancer Neg Hx    Stomach cancer Neg Hx    Rectal cancer Neg Hx     Past Surgical History:  Procedure Laterality Date   ABDOMINAL HYSTERECTOMY     BREAST BIOPSY  03/09/1997   Dr.Finder   CARDIOVERSION N/A 12/25/2019   Procedure: CARDIOVERSION;  Surgeon: Adrian Prows, MD;  Location: [facility] ENDOSCOPY;  Service: Cardiovascular;   Laterality: N/A;   CARDIOVERSION N/A 02/06/2020   Procedure: CARDIOVERSION;  Surgeon: Adrian Prows, MD;  Location: [facility] ENDOSCOPY;  Service: Cardiovascular;  Laterality: N/A;   CHOLECYSTECTOMY     ~ 6 yrs ago   COLONOSCOPY  2009   Dr.Chesley   CYSTECTOMY  09/04/2019   Dr.Gray   GALLBLADDER SURGERY  03/10/2015   Dr.Amid ( [place] )   POLYPECTOMY     12 polyps removed at [place]  2016   Social History   Occupational History   Not on file  Tobacco Use   Smoking status: Never   Smokeless tobacco: Never  Vaping Use   Vaping Use: Never used  Substance and Sexual Activity   Alcohol use: Yes    Alcohol/week: 1.0 standard drink    Types: 1 Glasses of wine per week    Comment: occa   Drug use: Never   Sexual activity: Not Currently

## 2020-12-24 ENCOUNTER — Other Ambulatory Visit: Payer: Self-pay | Admitting: Nurse Practitioner

## 2020-12-24 DIAGNOSIS — I10 Essential (primary) hypertension: Secondary | ICD-10-CM

## 2021-01-08 ENCOUNTER — Encounter: Payer: Self-pay | Admitting: Family Medicine

## 2021-01-08 ENCOUNTER — Ambulatory Visit
Admission: RE | Admit: 2021-01-08 | Discharge: 2021-01-08 | Disposition: A | Discharge status: Home / Self Care | Payer: Medicare Other | Source: Ambulatory Visit | Attending: Nurse Practitioner | Admitting: Nurse Practitioner

## 2021-01-08 ENCOUNTER — Ambulatory Visit (INDEPENDENT_AMBULATORY_CARE_PROVIDER_SITE_OTHER): Payer: Medicare Other | Admitting: Family Medicine

## 2021-01-08 ENCOUNTER — Other Ambulatory Visit: Payer: Self-pay

## 2021-01-08 VITALS — BP 130/82 | HR 80 | Temp 96.6°F | Ht 67.0 in | Wt 281.2 lb

## 2021-01-08 DIAGNOSIS — I4819 Other persistent atrial fibrillation: Secondary | ICD-10-CM | POA: Diagnosis not present

## 2021-01-08 DIAGNOSIS — M1990 Unspecified osteoarthritis, unspecified site: Secondary | ICD-10-CM

## 2021-01-08 DIAGNOSIS — E782 Mixed hyperlipidemia: Secondary | ICD-10-CM

## 2021-01-08 DIAGNOSIS — Z1231 Encounter for screening mammogram for malignant neoplasm of breast: Secondary | ICD-10-CM

## 2021-01-08 DIAGNOSIS — I159 Secondary hypertension, unspecified: Secondary | ICD-10-CM | POA: Diagnosis not present

## 2021-01-08 NOTE — Progress Notes (Signed)
Provider:  Alain Honey, MD  Careteam: Patient Care Team: Lauree Chandler, NP as PCP - General (Geriatric Medicine)  PLACE OF SERVICE:  [place] Directive information    No Known Allergies  Chief Complaint  Patient presents with   Acute Visit    Patient presents today for bilateral leg pain.     HPI: Patient is a 75 y.o. female .  Patient has had a history of leg pain but is worse the last 2 days.  Also has swelling in left ankle that is also chronic.  She has history of osteoarthritis.  She has been taking 1 Tylenol per day for pain as well as using some topical, OTC creams.  Review of Systems:  Review of Systems  Constitutional: Negative.   Respiratory: Negative.    Cardiovascular:  Positive for leg swelling.       Unilateral on left side and ankle  Gastrointestinal: Negative.   Genitourinary: Negative.   Neurological: Negative.   Psychiatric/Behavioral: Negative.    All other systems reviewed and are negative.  Past Medical History:  Diagnosis Date   Arthritis    self report   Bone fracture    Right Foot   H/O hernia repair    History of hysterectomy    History of mammogram    Dr.Elizabeth Gwenlyn Saran   History of MRI    Dr.Elizabeth Gwenlyn Saran.   History of removal of skin mole 03/09/1997   Hyperlipidemia    Hypertension    Scalp hematoma    Wears glasses    Past Surgical History:  Procedure Laterality Date   ABDOMINAL HYSTERECTOMY     BREAST BIOPSY  03/09/1997   Dr.Finder   CARDIOVERSION N/A 12/25/2019   Procedure: CARDIOVERSION;  Surgeon: Adrian Prows, MD;  Location: [facility] ENDOSCOPY;  Service: Cardiovascular;  Laterality: N/A;   CARDIOVERSION N/A 02/06/2020   Procedure: CARDIOVERSION;  Surgeon: Adrian Prows, MD;  Location: [facility] ENDOSCOPY;  Service: Cardiovascular;  Laterality: N/A;   CHOLECYSTECTOMY     ~ 6 yrs ago   COLONOSCOPY  2009   Dr.Chesley   CYSTECTOMY  09/04/2019   Dr.Gray   GALLBLADDER SURGERY  03/10/2015   Dr.Amid ( [place] )    POLYPECTOMY     12 polyps removed at [place]  2016   Social History:   reports that she has never smoked. She has never used smokeless tobacco. She reports current alcohol use of about 1.0 standard drink per week. She reports that she does not use drugs.  Family History  Problem Relation Age of Onset   Breast cancer Mother    Alzheimer's disease Mother    Dementia Mother    Parkinson's disease Mother    Colon polyps Mother    Stroke Father    Dementia Father    Breast cancer Sister    Allergies Daughter    Allergies Daughter    Allergies Son    Colon cancer Neg Hx    Esophageal cancer Neg Hx    Stomach cancer Neg Hx    Rectal cancer Neg Hx     Medications: Patient's Medications  New Prescriptions   No medications on file  Previous Medications   AMIODARONE (PACERONE) 200 MG TABLET    Take 0.5 tablets (100 mg total) by mouth daily.   AMLODIPINE (NORVASC) 10 MG TABLET    Take 0.5 tablets (5 mg total) by mouth daily.   APIXABAN (ELIQUIS) 5 MG TABS TABLET  Take 1 tablet (5 mg total) by mouth 2 (two) times daily.   ASCORBIC ACID (VITAMIN C) 1000 MG TABLET    Take 1,000 mg by mouth daily.   CHOLECALCIFEROL (VITAMIN D3) 25 MCG (1000 UT) CAPS    Take 1,000 Units by mouth daily.    METOPROLOL SUCCINATE (TOPROL-XL) 25 MG 24 HR TABLET    Take 1 tablet (25 mg total) by mouth daily. Take with or immediately following a meal.   MISC NATURAL PRODUCTS (ELDERBERRY IMMUNE COMPLEX) CHEW    Chew 50 mg by mouth daily.   MULTIPLE VITAMINS-MINERALS (COMPLETE MULTIVITAMIN/MINERAL PO)    Take 1 tablet by mouth daily. [employer] Vitamin   NON FORMULARY    Apply 1 application topically at bedtime. Veterinary liniment gel   NON FORMULARY    Apply 1 application topically as needed. Magne sport balm   NON FORMULARY    Apply 1 application topically as needed. Body balm with magnesium 12mg    OMEGA-3 KRILL OIL PO    Take 350 mg by mouth daily.    PSYLLIUM (METAMUCIL FIBER  PO)    Take 10 mLs by mouth 3 (three) times a week. With water or Juice   ROSUVASTATIN (CRESTOR) 5 MG TABLET    Take 1 tablet (5 mg total) by mouth daily.   VALSARTAN-HYDROCHLOROTHIAZIDE (DIOVAN-HCT) 320-25 MG TABLET    Take 1 tablet by mouth every morning.   VITAMIN B-12 (CYANOCOBALAMIN) 1000 MCG TABLET    Take 1,000 mcg by mouth daily.   ZINC 50 MG TABS    Take 1 tablet by mouth daily.  Modified Medications   No medications on file  Discontinued Medications   No medications on file    Physical Exam:  There were no vitals filed for this visit. There is no height or weight on file to calculate BMI. Wt Readings from Last 3 Encounters:  10/18/20 281 lb 9.6 oz (127.7 kg)  10/16/20 280 lb (127 kg)  09/17/20 282 lb 6.4 oz (128.1 kg)    Physical Exam Vitals and nursing note reviewed.  Constitutional:      Appearance: She is obese.  HENT:     Head: Normocephalic.  Cardiovascular:     Rate and Rhythm: Normal rate and regular rhythm.  Pulmonary:     Effort: Pulmonary effort is normal.     Breath sounds: Normal breath sounds.  Musculoskeletal:        General: No tenderness. Normal range of motion.     Left lower leg: Edema present.  Neurological:     General: No focal deficit present.     Mental Status: She is oriented to person, place, and time.    Labs reviewed: Basic Metabolic Panel: Recent Labs    02/06/20 1038 06/05/20 1202 10/18/20 1156  NA 141 145* 140  K 3.5 4.1 3.9  CL 104 104 104  CO2  --  25 28  GLUCOSE 104* 84 105*  BUN 9 13 14   CREATININE 0.90 0.98 0.85  CALCIUM  --  9.3 9.3   Liver Function Tests: Recent Labs    06/05/20 1202 10/18/20 1156  AST 33 26  ALT 36* 23  ALKPHOS 72  --   BILITOT 0.4 0.4  PROT 6.9 6.8  ALBUMIN 4.1  --    No results for input(s): LIPASE, AMYLASE in the last 8760 hours. No results for input(s): AMMONIA in the last 8760 hours. CBC: Recent Labs    02/06/20 1038 10/18/20 1156  WBC  --  6.5  NEUTROABS  --  4,193  HGB  13.6 11.8  HCT 40.0 36.2  MCV  --  83.8  PLT  --  235   Lipid Panel: Recent Labs    10/18/20 1156  CHOL 151  HDL 40*  LDLCALC 85  TRIG 165*  CHOLHDL 3.8   TSH: No results for input(s): TSH in the last 8760 hours. A1C: No results found for: HGBA1C   Assessment/Plan  1. Persistent atrial fibrillation (HCC) Currently managed with amiodarone, Eliquis, and metoprolol  2. Arthritis Would like her to increase Tylenol arthritis strength to at least 3 to 4 tablets a day.  This would still be within the acceptable range of acetaminophen.  3. Mixed hyperlipidemia Continues with rosuvastatin 5 mg.  Most recent LDL from 2 months ago was 85.   4. Secondary hypertension We have been adjusting her amlodipine and metoprolol.  Because of her dependent edema, I decreased amlodipine from 10 mg to 5 mg.  Patient feels like this is still a problem, so plan is to stop amlodipine totally and increase metoprolol from 25 to 50 mg and follow blood pressure.   Alain Honey, MD [facility] [phone]

## 2021-01-08 NOTE — Patient Instructions (Addendum)
Increase Tylenol to 2 tab twice a day Stop amlodipine and increase metoprolol to 2 tab at night May try Voltaren topically

## 2021-01-10 DIAGNOSIS — M1990 Unspecified osteoarthritis, unspecified site: Secondary | ICD-10-CM | POA: Insufficient documentation

## 2021-01-10 DIAGNOSIS — E785 Hyperlipidemia, unspecified: Secondary | ICD-10-CM | POA: Insufficient documentation

## 2021-01-10 DIAGNOSIS — I10 Essential (primary) hypertension: Secondary | ICD-10-CM | POA: Insufficient documentation

## 2021-01-28 ENCOUNTER — Other Ambulatory Visit: Payer: Self-pay | Admitting: Cardiology

## 2021-01-28 DIAGNOSIS — I480 Paroxysmal atrial fibrillation: Secondary | ICD-10-CM

## 2021-03-19 ENCOUNTER — Other Ambulatory Visit: Payer: Self-pay | Admitting: Cardiology

## 2021-03-19 DIAGNOSIS — I480 Paroxysmal atrial fibrillation: Secondary | ICD-10-CM

## 2021-03-19 DIAGNOSIS — I10 Essential (primary) hypertension: Secondary | ICD-10-CM

## 2021-03-24 ENCOUNTER — Other Ambulatory Visit: Payer: Self-pay | Admitting: Nurse Practitioner

## 2021-03-25 ENCOUNTER — Other Ambulatory Visit: Payer: Self-pay | Admitting: *Deleted

## 2021-03-25 MED ORDER — METOPROLOL SUCCINATE ER 50 MG PO TB24: 50.0000 mg | ORAL_TABLET | Freq: Every day | ORAL | 0 refills | Status: DC

## 2021-03-25 NOTE — Telephone Encounter (Signed)
Patient called and stated that she saw Dr. Sabra Heck on 01/08/2021 and he changed her Metoprolol from 25 to 50mg  and now she is about out and needs a Rx for this change sent to her pharmacy.  Medication list updated and Pended Rx and sent to Dr. Sabra Heck for approval.    OV NOTE Dated 01/08/2021: 4. Secondary hypertension We have been adjusting her amlodipine and metoprolol.  Because of her dependent edema, I decreased amlodipine from 10 mg to 5 mg.  Patient feels like this is still a problem, so plan is to stop amlodipine totally and increase metoprolol from 25 to 50 mg and follow blood pressure.

## 2021-03-26 ENCOUNTER — Other Ambulatory Visit: Payer: Self-pay

## 2021-03-26 MED ORDER — METOPROLOL SUCCINATE ER 50 MG PO TB24: 50.0000 mg | ORAL_TABLET | Freq: Two times a day (BID) | ORAL | 1 refills | Status: DC

## 2021-03-26 NOTE — Addendum Note (Signed)
Addended by: Heriberto Antigua E on: 03/26/2021 04:55 PM   Modules accepted: Orders

## 2021-03-26 NOTE — Addendum Note (Signed)
Addended by: Heriberto Antigua E on: 03/26/2021 04:53 PM   Modules accepted: Orders

## 2021-03-26 NOTE — Telephone Encounter (Addendum)
Patient office visit from 01/08/2021 states that patient needs to increase Metoprolol 50mg  from once daily at bedtime to twice daily at bedtime per Dr.Miller. Patient called and states that pharmacy needs this corrected and prescription sent. Medication sent to pharmacy and correct dosage reflects provider notes.

## 2021-04-18 ENCOUNTER — Ambulatory Visit: Payer: Medicare Other | Admitting: Cardiology

## 2021-04-18 ENCOUNTER — Encounter: Payer: Self-pay | Admitting: Cardiology

## 2021-04-18 ENCOUNTER — Other Ambulatory Visit: Payer: Self-pay

## 2021-04-18 VITALS — BP 130/70 | HR 63 | Temp 98.7°F | Resp 16 | Ht 67.0 in | Wt 281.4 lb

## 2021-04-18 DIAGNOSIS — I480 Paroxysmal atrial fibrillation: Secondary | ICD-10-CM

## 2021-04-18 DIAGNOSIS — I5032 Chronic diastolic (congestive) heart failure: Secondary | ICD-10-CM

## 2021-04-18 DIAGNOSIS — I10 Essential (primary) hypertension: Secondary | ICD-10-CM

## 2021-04-18 DIAGNOSIS — R0602 Shortness of breath: Secondary | ICD-10-CM

## 2021-04-18 MED ORDER — SPIRONOLACTONE 25 MG PO TABS: 25.0000 mg | ORAL_TABLET | ORAL | 2 refills | Status: DC

## 2021-04-18 NOTE — Progress Notes (Signed)
Primary Physician/Referring:  Lauree Chandler, NP  Patient ID: Terry Koch, female    DOB: 12/16/1945, 76 y.o.   MRN: [id number]  Chief Complaint  Patient presents with   Follow-up   CHEST DISCOMFORT   Shortness of Breath        Atrial Fibrillation   HPI:    Terry Koch  is a 76 y.o. AAfrican-American female with hypertension, hyperlipidemia, obesity, colonic polyps requiring serial surveillance colonoscopy, no prior history of GI bleed underwent cardioversion 12/25/2019 was unsuccessful. Underwent repeat cardioversion with Amiodarone on board successfully to normal sinus rhythm 02/06/2020.  This is 35-month office visit, initiated by her, due to dyspnea on exertion, and also chest pain.  Chest pain described as sharp pain in the left upper part of the chest lasting few seconds.  She also complains of dyspnea on exertion.  No PND or orthopnea.  She admits to having difficulty losing weight.  She is planning on multiple trips to and wanted to be checked out.  Since discontinuing amlodipine, leg edema has completely resolved.  Past Medical History:  Diagnosis Date   Arthritis    self report   Bone fracture    Right Foot   H/O hernia repair    History of hysterectomy    History of mammogram    Dr.Elizabeth Gwenlyn Saran   History of MRI    Dr.Elizabeth Gwenlyn Saran.   History of removal of skin mole 03/09/1997   Hyperlipidemia    Hypertension    Scalp hematoma    Wears glasses    Past Surgical History:  Procedure Laterality Date   ABDOMINAL HYSTERECTOMY     BREAST BIOPSY  03/09/1997   Dr.Finder   BREAST BIOPSY Right 11/07/2019   FIBROADENOMA WITH CALCIFICATIONS   BREAST BIOPSY Left 11/07/2019   FIBROADENOMA WITH CALCIFICATIONS   CARDIOVERSION N/A 12/25/2019   Procedure: CARDIOVERSION;  Surgeon: Adrian Prows, MD;  Location: [facility] ENDOSCOPY;  Service: Cardiovascular;  Laterality: N/A;   CARDIOVERSION N/A 02/06/2020   Procedure: CARDIOVERSION;  Surgeon: Adrian Prows, MD;   Location: [facility] ENDOSCOPY;  Service: Cardiovascular;  Laterality: N/A;   CHOLECYSTECTOMY     ~ 6 yrs ago   COLONOSCOPY  2009   Dr.Chesley   CYSTECTOMY  09/04/2019   Dr.Gray   GALLBLADDER SURGERY  03/10/2015   Dr.Amid ( [place] )   POLYPECTOMY     12 polyps removed at [facility]  2016   Family History  Problem Relation Age of Onset   Breast cancer Mother    Alzheimer's disease Mother    Dementia Mother    Parkinson's disease Mother    Colon polyps Mother    Stroke Father    Dementia Father    Breast cancer Sister    Allergies Daughter    Allergies Daughter    Allergies Son    Colon cancer Neg Hx    Esophageal cancer Neg Hx    Stomach cancer Neg Hx    Rectal cancer Neg Hx     Social History   Tobacco Use   Smoking status: Never   Smokeless tobacco: Never  Substance Use Topics   Alcohol use: Yes    Alcohol/week: 1.0 standard drink    Types: 1 Glasses of wine per week    Comment: occa   Marital Status: Married  ROS  Review of Systems  Cardiovascular:  Negative for chest pain, dyspnea on exertion and leg swelling.  Gastrointestinal:  Negative for melena.  Objective  Blood pressure 130/70, pulse 63, temperature 98.7 F (37.1 C), temperature source Temporal, resp. rate 16, height 5\' 7"  (1.702 m), weight 281 lb 6.4 oz (127.6 kg), SpO2 96 %.  Vitals with BMI 04/18/2021 04/18/2021 01/08/2021  Height - 5\' 7"  5\' 7"   Weight - 281 lbs 6 oz 281 lbs 3 oz  BMI - 77.41 28.78  Systolic 676 720 947  Diastolic 70 62 82  Pulse - 63 80     Physical Exam Vitals reviewed.  Constitutional:      Comments: Morbidly obese in no acute distress.  HENT:     Head: Normocephalic and atraumatic.  Neck:     Vascular: No carotid bruit or JVD.  Cardiovascular:     Rate and Rhythm: Normal rate and regular rhythm.     Pulses: Intact distal pulses.          Carotid pulses are 2+ on the right side and 2+ on the left side.      Dorsalis pedis pulses are 2+ on the right  side and 2+ on the left side.       Posterior tibial pulses are 2+ on the right side and 2+ on the left side.     Heart sounds: Normal heart sounds, S1 normal and S2 normal. No murmur heard.   No gallop.     Comments: Femoral and popliteal pulse difficult to feel due to patient's body habitus.  Pulmonary:     Effort: Pulmonary effort is normal. No respiratory distress.     Breath sounds: Normal breath sounds. No wheezing, rhonchi or rales.  Abdominal:     General: Bowel sounds are normal.     Palpations: Abdomen is soft.     Comments: Obese. Pannus present  Musculoskeletal:     Cervical back: Neck supple.     Right lower leg: No edema.     Left lower leg: No edema.  Neurological:     Mental Status: She is alert.   Laboratory examination:   Recent Labs    06/05/20 1202 10/18/20 1156  NA 145* 140  K 4.1 3.9  CL 104 104  CO2 25 28  GLUCOSE 84 105*  BUN 13 14  CREATININE 0.98 0.85  CALCIUM 9.3 9.3   CrCl cannot be calculated (Patient's most recent lab result is older than the maximum 21 days allowed.).  CMP Latest Ref Rng & Units 10/18/2020 06/05/2020 02/06/2020  Glucose 65 - 99 mg/dL 105(H) 84 104(H)  BUN 7 - 25 mg/dL 14 13 9   Creatinine 0.60 - 1.00 mg/dL 0.85 0.98 0.90  Sodium 135 - 146 mmol/L 140 145(H) 141  Potassium 3.5 - 5.3 mmol/L 3.9 4.1 3.5  Chloride 98 - 110 mmol/L 104 104 104  CO2 20 - 32 mmol/L 28 25 -  Calcium 8.6 - 10.4 mg/dL 9.3 9.3 -  Total Protein 6.1 - 8.1 g/dL 6.8 6.9 -  Total Bilirubin 0.2 - 1.2 mg/dL 0.4 0.4 -  Alkaline Phos 44 - 121 IU/L - 72 -  AST 10 - 35 U/L 26 33 -  ALT 6 - 29 U/L 23 36(H) -   CBC Latest Ref Rng & Units 10/18/2020 02/06/2020 12/07/2019  WBC 3.8 - 10.8 Thousand/uL 6.5 - 5.7  Hemoglobin 11.7 - 15.5 g/dL 11.8 13.6 13.0  Hematocrit 35.0 - 45.0 % 36.2 40.0 40.5  Platelets 140 - 400 Thousand/uL 235 - 215    Lipid Panel Recent Labs    10/18/20 1156  CHOL 151  TRIG 165*  LDLCALC 85  HDL 40*  CHOLHDL 3.8    HEMOGLOBIN  A1C No results found for: HGBA1C, MPG TSH No results for input(s): TSH in the last 8760 hours.  Medications and allergies   Allergies  Allergen Reactions   Amlodipine Other (See Comments)    Leg edema     Outpatient Medications Prior to Visit  Medication Sig Dispense Refill   acetaminophen (TYLENOL) 650 MG CR tablet Take 650 mg by mouth every 8 (eight) hours as needed for pain.     amiodarone (PACERONE) 200 MG tablet Take 0.5 tablets (100 mg total) by mouth daily. 90 tablet 3   Ascorbic Acid (VITAMIN C) 1000 MG tablet Take 1,000 mg by mouth daily.     Cholecalciferol (VITAMIN D3) 25 MCG (1000 UT) CAPS Take 1,000 Units by mouth daily.      ELIQUIS 5 MG TABS tablet TAKE 1 TABLET(5 MG) BY MOUTH TWICE DAILY 180 tablet 3   metoprolol succinate (TOPROL-XL) 50 MG 24 hr tablet Take 1 tablet (50 mg total) by mouth 2 (two) times daily. Take at Bedtime. Take with or immediately following a meal. 180 tablet 1   Misc Natural Products (ELDERBERRY IMMUNE COMPLEX) CHEW Chew 50 mg by mouth daily.     Multiple Vitamins-Minerals (COMPLETE MULTIVITAMIN/MINERAL PO) Take 1 tablet by mouth daily. [employer] Vitamin     NON FORMULARY Apply 1 application topically at bedtime. Veterinary liniment gel     NON FORMULARY Apply 1 application topically as needed. Magne sport balm     NON FORMULARY Apply 1 application topically as needed. Body balm with magnesium 12mg      OMEGA-3 KRILL OIL PO Take 350 mg by mouth daily.      Psyllium (METAMUCIL FIBER PO) Take 10 mLs by mouth 3 (three) times a week. With water or Juice     rosuvastatin (CRESTOR) 5 MG tablet Take 1 tablet (5 mg total) by mouth daily. 90 tablet 3   valsartan-hydrochlorothiazide (DIOVAN-HCT) 320-25 MG tablet Take 1 tablet by mouth every morning. 90 tablet 3   vitamin B-12 (CYANOCOBALAMIN) 1000 MCG tablet Take 1,000 mcg by mouth daily.     Zinc 50 MG TABS Take 1 tablet by mouth daily.     amLODipine (NORVASC) 10 MG tablet Take 0.5 tablets (5 mg  total) by mouth daily. 90 tablet 3   amLODipine (NORVASC) 5 MG tablet Take 5 mg by mouth daily.     sulfamethoxazole-trimethoprim (BACTRIM DS) 800-160 MG tablet Take 1 tablet by mouth 2 (two) times daily.     No facility-administered medications prior to visit.   Medications after today's encounter: Current Outpatient Medications  Medication Instructions   acetaminophen (TYLENOL) 650 mg, Oral, Every 8 hours PRN   amiodarone (PACERONE) 100 mg, Oral, Daily   ELIQUIS 5 MG TABS tablet TAKE 1 TABLET(5 MG) BY MOUTH TWICE DAILY   metoprolol succinate (TOPROL-XL) 50 mg, Oral, 2 times daily, Take at Bedtime. Take with or immediately following a meal.   Misc Natural Products (ELDERBERRY IMMUNE COMPLEX) CHEW 50 mg, Oral, Daily   Multiple Vitamins-Minerals (COMPLETE MULTIVITAMIN/MINERAL PO) 1 tablet, Oral, Daily, [employer] Vitamin   NON FORMULARY 1 application, Topical, Nightly, Veterinary liniment gel   NON FORMULARY 1 application, Topical, As needed, Magne sport balm   NON FORMULARY 1 application, Topical, As needed, Body balm with magnesium 12mg    OMEGA-3 KRILL OIL PO 350 mg, Oral, Daily   Psyllium (METAMUCIL FIBER PO) 10 mLs, Oral, 3 times weekly,  With water or Juice   rosuvastatin (CRESTOR) 5 mg, Oral, Daily   spironolactone (ALDACTONE) 25 mg, Oral, BH-each morning   valsartan-hydrochlorothiazide (DIOVAN-HCT) 320-25 MG tablet 1 tablet, Oral, BH-each morning   vitamin B-12 (CYANOCOBALAMIN) 1,000 mcg, Oral, Daily   vitamin C 1,000 mg, Oral, Daily   Vitamin D3 1,000 Units, Oral, Daily   Zinc 50 MG TABS 1 tablet, Oral, Daily    Radiology:   No results found.  Cardiac Studies:   Exercise Myoview stress test 10/18/2019: Exercise nuclear stress test was performed using Bruce protocol. Patient reached 5.1 METS, and 98% of age predicted maximum heart rate. Exercise capacity was low. No chest pain reported. Heart rate and hemodynamic response were normal. Stress EKG revealed no ischemic  changes. Normal myocardial perfusion. Stress LVEF calculated 49%, although visually appears normal. Low risk study.  Echocardiogram 10/25/2019:  Left ventricle cavity is normal in size. Mild concentric hypertrophy of  the left ventricle. Normal global wall motion. Normal LV systolic function with EF 55%. Doppler evidence of grade II (pseudonormal) diastolic dysfunction.  Left atrial cavity is severely dilated. Integrity of interatrial septum could not be adequately assessed to limited visualization. Consider  alternate imaging study, if clinically indicated.  Trileaflet aortic valve.  Trace aortic regurgitation.  Mild tricuspid regurgitation. No evidence of pulmonary hypertension.  Cardioversion 12/25/2019:  Indication symptomatic A. Fibrillation. Procedure: Using 90 mg of IV Propofol and 100 IV Lidocaine (for reducing venous pain) for achieving deep sedation, synchronized direct current cardioversion performed. Patient was delivered with 150 x 1 then 200 Joules of electricity X 3 without success. Patient tolerated the procedure well. No immediate complication noted.    Direct current cardioversion 02/06/20 : Indication symptomatic A. Fibrillation. Procedure: Using 100 mg of IV Propofol and 40 IV Lidocaine (for reducing venous pain) for achieving deep sedation, synchronized direct current cardioversion performed. Patient was delivered with 200J x 1 then 350 Joules of electricity X 1 with success to NSR. Patient tolerated the procedure well. No immediate complication noted.    EKG:   EKG 04/18/2021: Probably sinus rhythm at the rate of 59 bpm, left axis deviation, left anterior fascicular block.  LVH by voltage.  Nonspecific T wave flattening. No significant change from EKG 10/16/2020   EKG 01/02/2020: Atrial fibrillation with controlled ventricular response at the rate of 74 bpm, leftward axis, poor R wave progression, cannot exclude anteroseptal infarct old.  Nonspecific T abnormality.  No  significant change from 11/23/2019.  Assessment     ICD-10-CM   1. Paroxysmal atrial fibrillation (HCC)  I48.0 EKG 12-Lead    2. SOB (shortness of breath)  R06.02 EKG 12-Lead    3. Chronic diastolic heart failure (HCC)  I50.32 spironolactone (ALDACTONE) 25 MG tablet    Brain natriuretic peptide    4. Primary hypertension  K81 Basic metabolic panel    Basic metabolic panel    5. Class 3 severe obesity due to excess calories with serious comorbidity and body mass index (BMI) of 40.0 to 44.9 in adult (HCC)  E66.01    Z68.41        CHA2DS2-VASc Score is 4.  Yearly risk of stroke: 4.8% (A, F, HTN).   Medications Discontinued During This Encounter  Medication Reason   amLODipine (NORVASC) 10 MG tablet    amLODipine (NORVASC) 5 MG tablet    sulfamethoxazole-trimethoprim (BACTRIM DS) 800-160 MG tablet     Meds ordered this encounter  Medications   spironolactone (ALDACTONE) 25 MG tablet    Sig:  Take 1 tablet (25 mg total) by mouth every morning.    Dispense:  30 tablet    Refill:  2   Recommendations:   Stewart D Sarria is a 76 y.o. African-American female with hypertension, hyperlipidemia, obesity, colonic polyps requiring serial surveillance colonoscopy, no prior history of GI bleed underwent cardioversion 12/25/2019 was unsuccessful. Underwent repeat cardioversion with Amiodarone on board successfully to normal sinus rhythm 02/06/2020.  This is 51-month office visit, initiated by her, due to dyspnea on exertion, and also chest pain.  Chest pain symptoms clearly appear musculoskeletal.  With regard to dyspnea on exertion, she probably has chronic diastolic heart failure from grade 2 diastolic dysfunction, continued obesity.  Today there is no leg edema, no JVD and no gallop and does not suggest acute decompensation.  I will obtain a BNP today.  I would like to start her on spironolactone 25 mg daily.  Hopefully this will help with mild leg edema that she has and also chronic  diastolic heart failure and hypertension as well.  She will need repeat BMP in 2 weeks.  I would like to see her back in 6 weeks for follow-up.  I reassured her.  Weight loss again discussed with the patient.  She may benefit from referral to nutritional rehab and wellness clinic.  With regard to atrial fibrillation, she is maintaining sinus rhythm on low-dose amiodarone.     Adrian Prows, PA-C 04/18/2021, 4:14 PM Office: [phone]

## 2021-04-19 LAB — BRAIN NATRIURETIC PEPTIDE: BNP: 104 pg/mL — ABNORMAL HIGH (ref 0.0–100.0)

## 2021-04-21 ENCOUNTER — Encounter: Payer: Medicare Other | Admitting: Nurse Practitioner

## 2021-04-22 NOTE — Progress Notes (Signed)
error 

## 2021-04-23 ENCOUNTER — Other Ambulatory Visit: Payer: Self-pay | Admitting: Cardiology

## 2021-04-23 DIAGNOSIS — I480 Paroxysmal atrial fibrillation: Secondary | ICD-10-CM

## 2021-05-26 ENCOUNTER — Encounter: Payer: Self-pay | Admitting: Cardiology

## 2021-05-26 ENCOUNTER — Ambulatory Visit: Payer: Medicare Other | Admitting: Cardiology

## 2021-05-26 ENCOUNTER — Other Ambulatory Visit: Payer: Self-pay

## 2021-05-26 VITALS — BP 149/60 | HR 59 | Temp 97.0°F | Resp 12 | Ht 67.0 in | Wt 284.0 lb

## 2021-05-26 DIAGNOSIS — I480 Paroxysmal atrial fibrillation: Secondary | ICD-10-CM

## 2021-05-26 DIAGNOSIS — I5032 Chronic diastolic (congestive) heart failure: Secondary | ICD-10-CM

## 2021-05-26 DIAGNOSIS — I10 Essential (primary) hypertension: Secondary | ICD-10-CM

## 2021-05-26 MED ORDER — SPIRONOLACTONE 50 MG PO TABS: 50.0000 mg | ORAL_TABLET | ORAL | 3 refills | Status: DC

## 2021-05-26 NOTE — Progress Notes (Signed)
? ?Primary Physician/Referring:  Lauree Chandler, NP ? ?Patient ID: Terry Koch, female    DOB: [birth year], 76 y.o.   MRN: [id number] ? ?Chief Complaint  ?Patient presents with  ? Shortness of Breath  ? Follow-up  ?  6 weeks  ? ?HPI:   ? ?Terry Koch  is a 76 y.o. African-American female with hypertension, hyperlipidemia, obesity, colonic polyps requiring serial surveillance colonoscopy, no prior history of GI bleed underwent cardioversion 12/25/2019 was unsuccessful. Underwent repeat cardioversion with Amiodarone on board successfully to normal sinus rhythm 02/06/2020.  I had seen her 6 weeks ago for elevated blood pressure. ? ?She is now tolerating spironolactone without any side effects.  Leg edema has resolved.  Dyspnea on exertion is chronic and stable.  No PND or orthopnea.  No chest pain or palpitations. ? ?Past Medical History:  ?Diagnosis Date  ? Arthritis   ? self report  ? Bone fracture   ? Right Foot  ? H/O hernia repair   ? History of hysterectomy   ? History of mammogram   ? Dr.Elizabeth Gwenlyn Saran  ? History of MRI   ? Dr.Elizabeth Gwenlyn Saran.  ? History of removal of skin mole 03/09/1997  ? Hyperlipidemia   ? Hypertension   ? Scalp hematoma   ? Wears glasses   ? ?  ?Social History  ? ?Tobacco Use  ? Smoking status: Never  ? Smokeless tobacco: Never  ?Substance Use Topics  ? Alcohol use: Yes  ?  Alcohol/week: 1.0 standard drink  ?  Types: 1 Glasses of wine per week  ?  Comment: occasionally  ? ?Marital Status: Married  ?ROS  ?Review of Systems  ?Cardiovascular:  Negative for chest pain, dyspnea on exertion and leg swelling.  ?Gastrointestinal:  Negative for melena.  ?Objective  ?Blood pressure (!) 149/60, pulse (!) 59, temperature (!) 97 ?F (36.1 ?C), temperature source Temporal, resp. rate 12, height '5\' 7"'$  (1.702 m), weight 284 lb (128.8 kg), SpO2 97 %.  ?Vitals with BMI 05/26/2021 05/26/2021 04/18/2021  ?Height - '5\' 7"'$  -  ?Weight - 284 lbs -  ?BMI - 44.47 -  ?Systolic 297 989 211  ?Diastolic  60 80 70  ?Pulse 59 63 -  ?  ? Physical Exam ?Vitals reviewed.  ?Constitutional:   ?   Comments: Morbidly obese in no acute distress.  ?Neck:  ?   Vascular: No carotid bruit or JVD.  ?Cardiovascular:  ?   Rate and Rhythm: Normal rate and regular rhythm.  ?   Pulses: Intact distal pulses.     ?     Carotid pulses are 2+ on the right side and 2+ on the left side. ?     Dorsalis pedis pulses are 2+ on the right side and 2+ on the left side.  ?     Posterior tibial pulses are 2+ on the right side and 2+ on the left side.  ?   Heart sounds: Normal heart sounds, S1 normal and S2 normal. No murmur heard. ?  No gallop.  ?   Comments: Femoral and popliteal pulse difficult to feel due to patient's body habitus.  ?Pulmonary:  ?   Effort: Pulmonary effort is normal. No respiratory distress.  ?   Breath sounds: Normal breath sounds.  ?Abdominal:  ?   General: Bowel sounds are normal.  ?   Palpations: Abdomen is soft.  ?   Comments: Obese. Pannus present  ?Musculoskeletal:  ?   Right lower leg: No edema.  ?  Left lower leg: No edema.  ? ?Laboratory examination:  ? ?Recent Labs  ?  06/05/20 ?1202 10/18/20 ?1156  ?NA 145* 140  ?K 4.1 3.9  ?CL 104 104  ?CO2 25 28  ?GLUCOSE 84 105*  ?BUN 13 14  ?CREATININE 0.98 0.85  ?CALCIUM 9.3 9.3  ? ?CrCl cannot be calculated (Patient's most recent lab result is older than the maximum 21 days allowed.).  ?CMP Latest Ref Rng & Units 10/18/2020 06/05/2020 02/06/2020  ?Glucose 65 - 99 mg/dL 105(H) 84 104(H)  ?BUN 7 - 25 mg/dL '14 13 9  '$ ?Creatinine 0.60 - 1.00 mg/dL 0.85 0.98 0.90  ?Sodium 135 - 146 mmol/L 140 145(H) 141  ?Potassium 3.5 - 5.3 mmol/L 3.9 4.1 3.5  ?Chloride 98 - 110 mmol/L 104 104 104  ?CO2 20 - 32 mmol/L 28 25 -  ?Calcium 8.6 - 10.4 mg/dL 9.3 9.3 -  ?Total Protein 6.1 - 8.1 g/dL 6.8 6.9 -  ?Total Bilirubin 0.2 - 1.2 mg/dL 0.4 0.4 -  ?Alkaline Phos 44 - 121 IU/L - 72 -  ?AST 10 - 35 U/L 26 33 -  ?ALT 6 - 29 U/L 23 36(H) -  ? ?CBC Latest Ref Rng & Units 10/18/2020 02/06/2020 12/07/2019   ?WBC 3.8 - 10.8 Thousand/uL 6.5 - 5.7  ?Hemoglobin 11.7 - 15.5 g/dL 11.8 13.6 13.0  ?Hematocrit 35.0 - 45.0 % 36.2 40.0 40.5  ?Platelets 140 - 400 Thousand/uL 235 - 215  ? ? ?Lipid Panel ?Recent Labs  ?  10/18/20 ?1156  ?CHOL 151  ?TRIG 165*  ?[place] 85  ?HDL 40*  ?CHOLHDL 3.8  ? ?Medications and allergies  ? ?Allergies  ?Allergen Reactions  ? Amlodipine Other (See Comments)  ?  Leg edema  ?  ? ? ?Medications after today's encounter: ? ?Current Outpatient Medications:  ?  acetaminophen (TYLENOL) 650 MG CR tablet, Take 1,300 mg by mouth in the morning and at bedtime., Disp: , Rfl:  ?  amiodarone (PACERONE) 200 MG tablet, Take 0.5 tablets (100 mg total) by mouth daily., Disp: 90 tablet, Rfl: 3 ?  Ascorbic Acid (VITAMIN C) 1000 MG tablet, Take 1,000 mg by mouth daily., Disp: , Rfl:  ?  Cholecalciferol (VITAMIN D3) 25 MCG (1000 UT) CAPS, Take 1,000 Units by mouth daily. , Disp: , Rfl:  ?  ELIQUIS 5 MG TABS tablet, TAKE 1 TABLET(5 MG) BY MOUTH TWICE DAILY, Disp: 180 tablet, Rfl: 3 ?  metoprolol succinate (TOPROL-XL) 50 MG 24 hr tablet, Take 1 tablet (50 mg total) by mouth 2 (two) times daily. Take at Bedtime. Take with or immediately following a meal., Disp: 180 tablet, Rfl: 1 ?  Misc Natural Products (ELDERBERRY IMMUNE COMPLEX) CHEW, Chew 50 mg by mouth daily., Disp: , Rfl:  ?  Multiple Vitamins-Minerals (COMPLETE MULTIVITAMIN/MINERAL PO), Take 1 tablet by mouth daily. [employer] Vitamin, Disp: , Rfl:  ?  NON FORMULARY, Apply 1 application topically at bedtime. Veterinary liniment gel, Disp: , Rfl:  ?  NON FORMULARY, Apply 1 application topically as needed. Magne sport balm, Disp: , Rfl:  ?  NON FORMULARY, Apply 1 application topically as needed. Body balm with magnesium '12mg'$ , Disp: , Rfl:  ?  OMEGA-3 KRILL OIL PO, Take 350 mg by mouth daily. , Disp: , Rfl:  ?  Psyllium (METAMUCIL FIBER PO), Take 10 mLs by mouth 3 (three) times a week. With water or Juice, Disp: , Rfl:  ?  rosuvastatin (CRESTOR) 5 MG tablet, Take  1 tablet (5 mg total)  by mouth daily., Disp: 90 tablet, Rfl: 3 ?  valsartan-hydrochlorothiazide (DIOVAN-HCT) 320-25 MG tablet, Take 1 tablet by mouth every morning., Disp: 90 tablet, Rfl: 3 ?  vitamin B-12 (CYANOCOBALAMIN) 1000 MCG tablet, Take 1,000 mcg by mouth daily., Disp: , Rfl:  ?  Zinc 50 MG TABS, Take 1 tablet by mouth daily., Disp: , Rfl:  ?  spironolactone (ALDACTONE) 50 MG tablet, Take 1 tablet (50 mg total) by mouth every morning., Disp: 90 tablet, Rfl: 3  ?  ?Radiology:  ? ?No results found. ? ?Cardiac Studies:  ? ?Exercise Myoview stress test 10/18/2019: ?Exercise nuclear stress test was performed using Bruce protocol. Patient reached 5.1 METS, and 98% of age predicted maximum heart rate. Exercise capacity was low. No chest pain reported. Heart rate and hemodynamic response were normal. Stress EKG revealed no ischemic changes. ?Normal myocardial perfusion. Stress LVEF calculated 49%, although visually appears normal. ?Low risk study. ? ?Echocardiogram 10/25/2019:  ?Left ventricle cavity is normal in size. Mild concentric hypertrophy of  the left ventricle. Normal global wall motion. Normal LV systolic function with EF 55%. Doppler evidence of grade II (pseudonormal) diastolic dysfunction.  ?Left atrial cavity is severely dilated. Integrity of interatrial septum could not be adequately assessed to limited visualization. Consider  alternate imaging study, if clinically indicated.  ?Trileaflet aortic valve.  Trace aortic regurgitation.  ?Mild tricuspid regurgitation. No evidence of pulmonary hypertension. ? ?Cardioversion 12/25/2019:  ?Indication symptomatic A. Fibrillation. ?Procedure: Using 90 mg of IV Propofol and 100 IV Lidocaine (for reducing venous pain) for achieving deep sedation, synchronized direct current cardioversion performed. Patient was delivered with 150 x 1 then 200 Joules of electricity X 3 without success. Patient tolerated the procedure well. No immediate complication noted.   ? ?Direct  current cardioversion 02/06/20 : ?Indication symptomatic A. Fibrillation. ?Procedure: Using 100 mg of IV Propofol and 40 IV Lidocaine (for reducing venous pain) for achieving deep sedation, synchronized

## 2021-06-30 ENCOUNTER — Other Ambulatory Visit: Payer: Self-pay | Admitting: *Deleted

## 2021-06-30 MED ORDER — METOPROLOL SUCCINATE ER 50 MG PO TB24: 50.0000 mg | ORAL_TABLET | Freq: Two times a day (BID) | ORAL | 0 refills | Status: DC

## 2021-06-30 NOTE — Telephone Encounter (Signed)
Pharmacy requested refill.  Patient needs an appointment before anymore future refills.  

## 2021-07-19 ENCOUNTER — Other Ambulatory Visit: Payer: Self-pay | Admitting: Cardiology

## 2021-07-19 DIAGNOSIS — I480 Paroxysmal atrial fibrillation: Secondary | ICD-10-CM

## 2021-07-21 NOTE — Telephone Encounter (Signed)
Pt needs refill

## 2021-07-26 ENCOUNTER — Other Ambulatory Visit: Payer: Self-pay | Admitting: Cardiology

## 2021-07-26 DIAGNOSIS — I10 Essential (primary) hypertension: Secondary | ICD-10-CM

## 2021-10-03 ENCOUNTER — Ambulatory Visit: Payer: Medicare Other | Admitting: Nurse Practitioner

## 2021-11-14 ENCOUNTER — Ambulatory Visit (INDEPENDENT_AMBULATORY_CARE_PROVIDER_SITE_OTHER): Payer: Medicare Other | Admitting: Nurse Practitioner

## 2021-11-14 ENCOUNTER — Encounter: Payer: Self-pay | Admitting: Nurse Practitioner

## 2021-11-14 VITALS — BP 136/90 | HR 79 | Temp 97.0°F | Resp 18 | Ht 67.0 in | Wt 280.4 lb

## 2021-11-14 DIAGNOSIS — Z23 Encounter for immunization: Secondary | ICD-10-CM

## 2021-11-14 DIAGNOSIS — I10 Essential (primary) hypertension: Secondary | ICD-10-CM | POA: Diagnosis not present

## 2021-11-14 DIAGNOSIS — E782 Mixed hyperlipidemia: Secondary | ICD-10-CM

## 2021-11-14 DIAGNOSIS — M1990 Unspecified osteoarthritis, unspecified site: Secondary | ICD-10-CM

## 2021-11-14 DIAGNOSIS — I4819 Other persistent atrial fibrillation: Secondary | ICD-10-CM

## 2021-11-14 NOTE — Progress Notes (Signed)
Careteam: Patient Care Team: Lauree Chandler, NP as PCP - General (Geriatric Medicine)  PLACE OF SERVICE:  [place] Directive information Does Patient Have a Medical Advance Directive?: Yes, Type of Advance Directive: Living will, Does patient want to make changes to medical advance directive?: No - Patient declined  Allergies  Allergen Reactions   Amlodipine Other (See Comments)    Leg edema    Chief Complaint  Patient presents with   Medical Management of Chronic Issues    Routine visit.    Immunizations    Discuss the need for Tetanus vaccine, Pne vaccine, Covid Booster, and Influenza vaccine.      HPI: Patient is a 76 y.o. female for routine follow up.   -States that periodically she has numbness to the top of her right thigh. The feeling comes intermittently and does not happen often. No pain, tingling, or radiation. No pattern that she's noticed. Lasts for about half an hour and then resolves spontaneously.   -She states that when she takes her pills (metoprolol and amiodarone) at night she feels light headed and sleepy. This feeling lasts about  thirty minutes and then her energy will return.  Continues to follow up with cardiologist for a fib. No palpitations, chest pains.   -She does not take metamucil but states she takes another otc stool softener that works well  -Has arthritic pain in her shoulders, takes tylenol which has improved her mobility greatly.  -States she doesn't have chest pain but sometimes feels a little "ting". She is due to see her cardiologist next week.   -States she doesn't do any planned exercise, but remains very active    Review of Systems:  Review of Systems  Constitutional:  Negative for fever and weight loss.  HENT:  Negative for hearing loss.   Eyes:  Negative for blurred vision.  Respiratory:  Negative for cough and shortness of breath.   Cardiovascular:  Negative for chest pain and leg swelling.   Gastrointestinal:  Negative for abdominal pain, constipation, diarrhea, heartburn and nausea.  Genitourinary:  Negative for dysuria and hematuria.  Musculoskeletal:  Positive for joint pain.  Skin:  Negative for rash.  Neurological:  Positive for dizziness.  Psychiatric/Behavioral:  Negative for depression. The patient is not nervous/anxious.     Past Medical History:  Diagnosis Date   Arthritis    self report   Bone fracture    Right Foot   H/O hernia repair    History of hysterectomy    History of mammogram    Dr.Elizabeth Gwenlyn Saran   History of MRI    Dr.Elizabeth Gwenlyn Saran.   History of removal of skin mole 03/09/1997   Hyperlipidemia    Hypertension    Scalp hematoma    Wears glasses    Past Surgical History:  Procedure Laterality Date   ABDOMINAL HYSTERECTOMY     BREAST BIOPSY  03/09/1997   Dr.Finder   BREAST BIOPSY Right 11/07/2019   FIBROADENOMA WITH CALCIFICATIONS   BREAST BIOPSY Left 11/07/2019   FIBROADENOMA WITH CALCIFICATIONS   CARDIOVERSION N/A 12/25/2019   Procedure: CARDIOVERSION;  Surgeon: Adrian Prows, MD;  Location: [facility] ENDOSCOPY;  Service: Cardiovascular;  Laterality: N/A;   CARDIOVERSION N/A 02/06/2020   Procedure: CARDIOVERSION;  Surgeon: Adrian Prows, MD;  Location: [facility] ENDOSCOPY;  Service: Cardiovascular;  Laterality: N/A;   CHOLECYSTECTOMY     ~ 6 yrs ago   COLONOSCOPY  2009   [place]   CYSTECTOMY  09/04/2019  Dr.Gray   GALLBLADDER SURGERY  03/10/2015   Dr.[facility] )   POLYPECTOMY     12 polyps removed at [facility]  2016   Social History:   reports that she has never smoked. She has never used smokeless tobacco. She reports current alcohol use of about 1.0 standard drink of alcohol per week. She reports that she does not use drugs.  Family History  Problem Relation Age of Onset   Breast cancer Mother    Alzheimer's disease Mother    Dementia Mother    Parkinson's disease Mother    Colon polyps Mother     Stroke Father    Dementia Father    Breast cancer Sister    Allergies Daughter    Allergies Daughter    Allergies Son    Colon cancer Neg Hx    Esophageal cancer Neg Hx    Stomach cancer Neg Hx    Rectal cancer Neg Hx     Medications: Patient's Medications  New Prescriptions   No medications on file  Previous Medications   ACETAMINOPHEN (TYLENOL) 650 MG CR TABLET    Take 1,300 mg by mouth in the morning and at bedtime.   AMIODARONE (PACERONE) 200 MG TABLET    TAKE 1/2 TABLET BY MOUTH DAILY   ASCORBIC ACID (VITAMIN C) 1000 MG TABLET    Take 1,000 mg by mouth daily.   CHOLECALCIFEROL (VITAMIN D3) 25 MCG (1000 UT) CAPS    Take 1,000 Units by mouth daily.    ELIQUIS 5 MG TABS TABLET    TAKE 1 TABLET(5 MG) BY MOUTH TWICE DAILY   METOPROLOL SUCCINATE (TOPROL-XL) 50 MG 24 HR TABLET    Take 1 tablet (50 mg total) by mouth 2 (two) times daily. Take at Bedtime. Take with or immediately following a meal.NEEDS AN APPOINTMENT BEFORE ANYMORE FUTURE REFILLS.   MISC NATURAL PRODUCTS (ELDERBERRY IMMUNE COMPLEX) CHEW    Chew 50 mg by mouth daily.   MULTIPLE VITAMINS-MINERALS (COMPLETE MULTIVITAMIN/MINERAL PO)    Take 1 tablet by mouth daily. [employer] Vitamin   NON FORMULARY    Apply 1 application topically at bedtime. Veterinary liniment gel   NON FORMULARY    Apply 1 application topically as needed. [employer]ne sport balm   NON FORMULARY    Apply 1 application topically as needed. Body balm with magnesium 63m   OMEGA-3 KRILL OIL PO    Take 350 mg by mouth daily.    PSYLLIUM (METAMUCIL FIBER PO)    Take 10 mLs by mouth 3 (three) times a week. With water or Juice   ROSUVASTATIN (CRESTOR) 5 MG TABLET    Take 1 tablet (5 mg total) by mouth daily.   SPIRONOLACTONE (ALDACTONE) 50 MG TABLET    Take 1 tablet (50 mg total) by mouth every morning.   VALSARTAN-HYDROCHLOROTHIAZIDE (DIOVAN-HCT) 320-25 MG TABLET    Take 1 tablet by mouth every morning.   VITAMIN B-12 (CYANOCOBALAMIN) 1000 MCG TABLET    Take  1,000 mcg by mouth daily.   ZINC 50 MG TABS    Take 1 tablet by mouth daily.  Modified Medications   No medications on file  Discontinued Medications   No medications on file    Physical Exam:  Vitals:   11/14/21 0920  BP: (!) 136/90  Pulse: 79  Resp: 18  Temp: (!) 97 F (36.1 C)  SpO2: 97%  Weight: 280 lb 6.4 oz (127.2 kg)  Height: 5'  7" (1.702 m)   Body mass index is 43.92 kg/m. Wt Readings from Last 3 Encounters:  11/14/21 127.2 kg  05/26/21 128.8 kg  04/18/21 127.6 kg    Physical Exam Constitutional:      General: She is not in acute distress. HENT:     Mouth/Throat:     Mouth: Mucous membranes are moist.  Eyes:     Conjunctiva/sclera: Conjunctivae normal.  Cardiovascular:     Rate and Rhythm: Normal rate. Rhythm irregular.     Pulses: Normal pulses.     Heart sounds: Normal heart sounds.  Pulmonary:     Effort: Pulmonary effort is normal. No respiratory distress.     Breath sounds: Normal breath sounds. No wheezing.  Abdominal:     General: Bowel sounds are normal. There is no distension.     Palpations: Abdomen is soft.     Tenderness: There is no abdominal tenderness.  Musculoskeletal:     Right lower leg: No edema.     Left lower leg: No edema.  Skin:    General: Skin is warm and dry.  Neurological:     Mental Status: She is alert and oriented to person, place, and time. Mental status is at baseline.     Labs reviewed: Basic Metabolic Panel: No results for input(s): "NA", "K", "CL", "CO2", "GLUCOSE", "BUN", "CREATININE", "CALCIUM", "MG", "PHOS", "TSH" in the last 8760 hours. Liver Function Tests: No results for input(s): "AST", "ALT", "ALKPHOS", "BILITOT", "PROT", "ALBUMIN" in the last 8760 hours. No results for input(s): "LIPASE", "AMYLASE" in the last 8760 hours. No results for input(s): "AMMONIA" in the last 8760 hours. CBC: No results for input(s): "WBC", "NEUTROABS", "HGB", "HCT", "MCV", "PLT" in the last 8760 hours. Lipid Panel: No  results for input(s): "CHOL", "HDL", "LDLCALC", "TRIG", "CHOLHDL", "LDLDIRECT" in the last 8760 hours. TSH: No results for input(s): "TSH" in the last 8760 hours. A1C: No results found for: "HGBA1C"   Assessment/Plan 1. Need for influenza vaccination - received at this visit  - Flu Vaccine QUAD High Dose(Fluad)  2. Arthritis - controlled, continue tylenol and exercise.   3. Persistent atrial fibrillation ([place]) - follows with cardiology - rate controlled with metoprolol and amiodarone  -continues on eliquis for anticoaguation.   4. Mixed hyperlipidemia - encourage lifestyle modifications - continue crestor - Lipid panel  5. Essential hypertension - continue current regime - CBC with Differential/Platelet - CMP with eGFR([facility])  6. Morbid obesity ([place]) - encourage lifestyle modifications with diet and exericse.   Return in about 6 months (around 05/15/2022) for routine follow up .  Student- Waunita Schooner, RN I personally was present during the history, physical exam and medical decision-making activities of this service and have verified that the service and findings are accurately documented in the student's note  Kamaljit Hizer K. [place], [facility] [phone]

## 2021-11-17 ENCOUNTER — Other Ambulatory Visit: Payer: Self-pay

## 2021-11-17 MED ORDER — ROSUVASTATIN CALCIUM 10 MG PO TABS: 10.0000 mg | ORAL_TABLET | Freq: Every day | ORAL | 3 refills | Status: DC

## 2021-11-19 LAB — COMPLETE METABOLIC PANEL WITH GFR
AG Ratio: 1.8 (calc) (ref 1.0–2.5)
ALT: 20 U/L (ref 6–29)
AST: 21 U/L (ref 10–35)
Albumin: 4.3 g/dL (ref 3.6–5.1)
Alkaline phosphatase (APISO): 55 U/L (ref 37–153)
BUN/Creatinine Ratio: 11 (calc) (ref 6–22)
BUN: 12 mg/dL (ref 7–25)
CO2: 27 mmol/L (ref 20–32)
Calcium: 9.3 mg/dL (ref 8.6–10.4)
Chloride: 105 mmol/L (ref 98–110)
Creat: 1.05 mg/dL — ABNORMAL HIGH (ref 0.60–1.00)
Globulin: 2.4 g/dL (calc) (ref 1.9–3.7)
Glucose, Bld: 108 mg/dL — ABNORMAL HIGH (ref 65–99)
Potassium: 3.8 mmol/L (ref 3.5–5.3)
Sodium: 140 mmol/L (ref 135–146)
Total Bilirubin: 0.4 mg/dL (ref 0.2–1.2)
Total Protein: 6.7 g/dL (ref 6.1–8.1)
eGFR: 55 mL/min/{1.73_m2} — ABNORMAL LOW (ref >=60)

## 2021-11-19 LAB — CBC WITH DIFFERENTIAL/PLATELET
Absolute Monocytes: 362 cells/uL (ref 200–950)
Basophils Absolute: 31 cells/uL (ref 0–200)
Basophils Relative: 0.6 %
Eosinophils Absolute: 112 cells/uL (ref 15–500)
Eosinophils Relative: 2.2 %
HCT: 40 % (ref 35.0–45.0)
Hemoglobin: 13 g/dL (ref 11.7–15.5)
Lymphs Abs: 1826 cells/uL (ref 850–3900)
MCH: 27.7 pg (ref 27.0–33.0)
MCHC: 32.5 g/dL (ref 32.0–36.0)
MCV: 85.1 fL (ref 80.0–100.0)
MPV: 11.1 fL (ref 7.5–12.5)
Monocytes Relative: 7.1 %
Neutro Abs: 2769 cells/uL (ref 1500–7800)
Neutrophils Relative %: 54.3 %
Platelets: 190 10*3/uL (ref 140–400)
RBC: 4.7 10*6/uL (ref 3.80–5.10)
RDW: 14.3 % (ref 11.0–15.0)
Total Lymphocyte: 35.8 %
WBC: 5.1 10*3/uL (ref 3.8–10.8)

## 2021-11-19 LAB — TEST AUTHORIZATION

## 2021-11-19 LAB — LIPID PANEL
Cholesterol: 191 mg/dL (ref <200)
HDL: 52 mg/dL (ref >=50)
LDL Cholesterol (Calc): 115 mg/dL (calc) — ABNORMAL HIGH
Non-HDL Cholesterol (Calc): 139 mg/dL (calc) — ABNORMAL HIGH (ref <130)
Total CHOL/HDL Ratio: 3.7 (calc) (ref <5.0)
Triglycerides: 126 mg/dL (ref <150)

## 2021-11-19 LAB — HEMOGLOBIN A1C
Hgb A1c MFr Bld: 5.9 % of total Hgb — ABNORMAL HIGH (ref <5.7)
Mean Plasma Glucose: 123 mg/dL
eAG (mmol/L): 6.8 mmol/L

## 2021-11-20 ENCOUNTER — Ambulatory Visit: Payer: Medicare Other | Admitting: Cardiology

## 2021-11-20 ENCOUNTER — Encounter: Payer: Self-pay | Admitting: Cardiology

## 2021-11-20 VITALS — BP 140/75 | HR 63 | Temp 97.2°F | Resp 16 | Ht 67.0 in | Wt 282.4 lb

## 2021-11-20 DIAGNOSIS — I10 Essential (primary) hypertension: Secondary | ICD-10-CM

## 2021-11-20 DIAGNOSIS — I480 Paroxysmal atrial fibrillation: Secondary | ICD-10-CM

## 2021-11-20 DIAGNOSIS — I5032 Chronic diastolic (congestive) heart failure: Secondary | ICD-10-CM

## 2021-11-20 MED ORDER — SPIRONOLACTONE 25 MG PO TABS: 25.0000 mg | ORAL_TABLET | ORAL | 3 refills | Status: DC

## 2021-11-20 NOTE — Progress Notes (Signed)
Primary Physician/Referring:  Lauree Chandler, NP  Patient ID: Terry Koch, female    DOB: [birth year], 76 y.o.   MRN: [id number]  Chief Complaint  Patient presents with   Hypertension   Shortness of Breath   Follow-up    6 months   HPI:    Terry Koch  is a 76 y.o. African-American female with hypertension, hyperlipidemia, obesity, colonic polyps requiring serial surveillance colonoscopy, no prior history of GI bleed underwent cardioversion 12/25/2019 was unsuccessful. Underwent repeat cardioversion with Amiodarone on board successfully to normal sinus rhythm 02/06/2020.    This is a 76-monthoffice visit.  Dyspnea on exertion is chronic and stable.  No PND or orthopnea.  No chest pain or palpitations.  Past Medical History:  Diagnosis Date   Arthritis    self report   Bone fracture    Right Foot   H/O hernia repair    History of hysterectomy    History of mammogram    Dr.Elizabeth KGwenlyn Saran  History of MRI    Dr.Elizabeth KGwenlyn Saran   History of removal of skin mole 03/09/1997   Hyperlipidemia    Hypertension    Scalp hematoma    Wears glasses      Social History   Tobacco Use   Smoking status: Never   Smokeless tobacco: Never  Substance Use Topics   Alcohol use: Yes    Alcohol/week: 1.0 standard drink of alcohol    Types: 1 Glasses of wine per week    Comment: occasionally   Marital Status: Married  ROS  Review of Systems  Cardiovascular:  Positive for dyspnea on exertion. Negative for chest pain and leg swelling.   Objective  Blood pressure (!) 140/75, pulse 63, temperature (!) 97.2 F (36.2 C), temperature source Temporal, resp. rate 16, height '5\' 7"'$  (1.702 m), weight 282 lb 6.4 oz (128.1 kg), SpO2 96 %.     11/20/2021   11:16 AM 11/14/2021    9:20 AM 05/26/2021    2:59 PM  Vitals with BMI  Height '5\' 7"'$  '5\' 7"'$    Weight 282 lbs 6 oz 280 lbs 6 oz   BMI 484.66459.93  Systolic [id number] Diastolic 75 90 60  Pulse 63 79 59     Physical  Exam Constitutional:      Appearance: She is morbidly obese.  Neck:     Vascular: No carotid bruit or JVD.  Cardiovascular:     Rate and Rhythm: Normal rate and regular rhythm.     Pulses: Intact distal pulses.     Heart sounds: Normal heart sounds. No murmur heard.    No gallop.  Pulmonary:     Effort: Pulmonary effort is normal.     Breath sounds: Normal breath sounds.  Abdominal:     General: Bowel sounds are normal.     Palpations: Abdomen is soft.  Musculoskeletal:     Right lower leg: No edema.     Left lower leg: No edema.    Laboratory examination:   Recent Labs    11/14/21 1012  NA 140  K 3.8  CL 105  CO2 27  GLUCOSE 108*  BUN 12  CREATININE 1.05*  CALCIUM 9.3   estimated creatinine clearance is 63.5 mL/min (A) (by C-G formula based on SCr of 1.05 mg/dL (H)).     Latest Ref Rng & Units 11/14/2021   10:12 AM 10/18/2020   11:56 AM 06/05/2020   12:02 PM  CMP  Glucose 65 - 99 mg/dL 108  105  84   BUN 7 - 25 mg/dL '12  14  13   '$ Creatinine 0.60 - 1.00 mg/dL 1.05  0.85  0.98   Sodium 135 - 146 mmol/L 140  140  145   Potassium 3.5 - 5.3 mmol/L 3.8  3.9  4.1   Chloride 98 - 110 mmol/L 105  104  104   CO2 20 - 32 mmol/L '27  28  25   '$ Calcium 8.6 - 10.4 mg/dL 9.3  9.3  9.3   Total Protein 6.1 - 8.1 g/dL 6.7  6.8  6.9   Total Bilirubin 0.2 - 1.2 mg/dL 0.4  0.4  0.4   Alkaline Phos 44 - 121 IU/L   72   AST 10 - 35 U/L 21  26  33   ALT 6 - 29 U/L 20  23  36       Latest Ref Rng & Units 11/14/2021   10:12 AM 10/18/2020   11:56 AM 02/06/2020   10:38 AM  CBC  WBC 3.8 - 10.8 Thousand/uL 5.1  6.5    Hemoglobin 11.7 - 15.5 g/dL 13.0  11.8  13.6   Hematocrit 35.0 - 45.0 % 40.0  36.2  40.0   Platelets 140 - 400 Thousand/uL 190  235      Lipid Panel Recent Labs    11/14/21 1012  CHOL 191  TRIG 126  LDLCALC 115*  HDL 52  CHOLHDL 3.7   Medications and allergies   Allergies  Allergen Reactions   Amlodipine Other (See Comments)    Leg edema     Medications  after today's encounter:  Current Outpatient Medications:    acetaminophen (TYLENOL) 650 MG CR tablet, Take 1,300 mg by mouth in the morning and at bedtime., Disp: , Rfl:    amiodarone (PACERONE) 200 MG tablet, TAKE 1/2 TABLET BY MOUTH DAILY, Disp: 90 tablet, Rfl: 3   Ascorbic Acid (VITAMIN C) 1000 MG tablet, Take 1,000 mg by mouth daily., Disp: , Rfl:    Cholecalciferol (VITAMIN D3) 25 MCG (1000 UT) CAPS, Take 1,000 Units by mouth daily. , Disp: , Rfl:    ELIQUIS 5 MG TABS tablet, TAKE 1 TABLET(5 MG) BY MOUTH TWICE DAILY, Disp: 180 tablet, Rfl: 3   metoprolol succinate (TOPROL-XL) 50 MG 24 hr tablet, Take 1 tablet (50 mg total) by mouth 2 (two) times daily. Take at Bedtime. Take with or immediately following a meal.NEEDS AN APPOINTMENT BEFORE ANYMORE FUTURE REFILLS., Disp: 60 tablet, Rfl: 0   Misc Natural Products (ELDERBERRY IMMUNE COMPLEX) CHEW, Chew 50 mg by mouth daily., Disp: , Rfl:    Multiple Vitamins-Minerals (COMPLETE MULTIVITAMIN/MINERAL PO), Take 1 tablet by mouth daily. [employer] Vitamin, Disp: , Rfl:    NON FORMULARY, Apply 1 application topically at bedtime. Veterinary liniment gel, Disp: , Rfl:    NON FORMULARY, Apply 1 application topically as needed. Body balm with magnesium '12mg'$ , Disp: , Rfl:    OMEGA-3 KRILL OIL PO, Take 350 mg by mouth daily. , Disp: , Rfl:    Psyllium (METAMUCIL FIBER PO), Take 10 mLs by mouth 3 (three) times a week. With water or Juice, Disp: , Rfl:    rosuvastatin (CRESTOR) 10 MG tablet, Take 1 tablet (10 mg total) by mouth daily., Disp: 90 tablet, Rfl: 3   spironolactone (ALDACTONE) 25 MG tablet, Take 1 tablet (25 mg total) by mouth every morning., Disp: 90 tablet, Rfl: 3   valsartan-hydrochlorothiazide (DIOVAN-HCT) 320-25  MG tablet, Take 1 tablet by mouth every morning., Disp: 90 tablet, Rfl: 3   vitamin B-12 (CYANOCOBALAMIN) 1000 MCG tablet, Take 1,000 mcg by mouth daily., Disp: , Rfl:    Zinc 50 MG TABS, Take 1 tablet by mouth daily., Disp: , Rfl:      Radiology:   No results found.  Cardiac Studies:   Exercise Myoview stress test 10/18/2019: Exercise nuclear stress test was performed using Bruce protocol. Patient reached 5.1 METS, and 98% of age predicted maximum heart rate. Exercise capacity was low. No chest pain reported. Heart rate and hemodynamic response were normal. Stress EKG revealed no ischemic changes. Normal myocardial perfusion. Stress LVEF calculated 49%, although visually appears normal. Low risk study.  Echocardiogram 10/25/2019:  Left ventricle cavity is normal in size. Mild concentric hypertrophy of  the left ventricle. Normal global wall motion. Normal LV systolic function with EF 55%. Doppler evidence of grade II (pseudonormal) diastolic dysfunction.  Left atrial cavity is severely dilated. Integrity of interatrial septum could not be adequately assessed to limited visualization. Consider  alternate imaging study, if clinically indicated.  Trileaflet aortic valve.  Trace aortic regurgitation.  Mild tricuspid regurgitation. No evidence of pulmonary hypertension.  Cardioversion 12/25/2019:  Indication symptomatic A. Fibrillation. Procedure: Using 90 mg of IV Propofol and 100 IV Lidocaine (for reducing venous pain) for achieving deep sedation, synchronized direct current cardioversion performed. Patient was delivered with 150 x 1 then 200 Joules of electricity X 3 without success. Patient tolerated the procedure well. No immediate complication noted.    Direct current cardioversion 02/06/20 : Indication symptomatic A. Fibrillation. Procedure: Using 100 mg of IV Propofol and 40 IV Lidocaine (for reducing venous pain) for achieving deep sedation, synchronized direct current cardioversion performed. Patient was delivered with 200J x 1 then 350 Joules of electricity X 1 with success to NSR. Patient tolerated the procedure well. No immediate complication noted.    EKG:   EKG 11/20/2021: Normal sinus rhythm at rate of 66  bpm, left atrial enlargement, left axis deviation, left intrafascicular block.  LVH.  Nonspecific T abnormality.  Normal QT interval.  Compared to 04/18/2021, probable sinus rhythm is now clearly sinus rhythm.  EKG 01/02/2020: Atrial fibrillation with controlled ventricular response at the rate of 74 bpm, leftward axis, poor R wave progression, cannot exclude anteroseptal infarct old.  Nonspecific T abnormality.  No significant change from 11/23/2019.  Assessment     ICD-10-CM   1. Paroxysmal atrial fibrillation (HCC)  I48.0     2. Primary hypertension  I10 EKG 12-Lead    spironolactone (ALDACTONE) 25 MG tablet    Basic metabolic panel    3. Chronic diastolic heart failure (HCC)  I50.32        CHA2DS2-VASc Score is 4.  Yearly risk of stroke: 4.8% (A, F, HTN).   Medications Discontinued During This Encounter  Medication Reason   NON FORMULARY    spironolactone (ALDACTONE) 50 MG tablet     Meds ordered this encounter  Medications   spironolactone (ALDACTONE) 25 MG tablet    Sig: Take 1 tablet (25 mg total) by mouth every morning.    Dispense:  90 tablet    Refill:  3   Recommendations:   Terry Koch is a 76 y.o. African-American female with hypertension, hyperlipidemia, obesity, colonic polyps requiring serial surveillance colonoscopy, no prior history of GI bleed underwent cardioversion 12/25/2019 was unsuccessful. Underwent repeat cardioversion with Amiodarone on board successfully to normal sinus rhythm 02/06/2020.    This  is a 69-monthoffice visit, she is fortunately maintaining sinus rhythm and feels well and asymptomatic.  No clinical evidence of heart failure.  She is presently on amiodarone 100 mg daily, continue the same.  Upon review of the medications, she was previously on spironolactone 50 mg daily, for some reason she has not been taking it for quite a while.  As recent labs done by JSherrie Mustache NP revealed mild increase in serum creatinine, would like to  restart the spironolactone only at 25 mg dose.  She will get BMP in 2 to 3 months for follow-up.  Previously she did tolerate 50 mg without any adverse effects on potassium or renal function.  Blood pressure is elevated, hopefully addition of this will make blood pressure better.  Otherwise no changes in the medications were done today.  Lipids mildly elevated LDL has been addressed already by PCP.  I will see him back in 6 months for follow-up.   JAdrian Prows PA-C 11/20/2021, 12:12 PM Office: [phone]

## 2021-11-22 ENCOUNTER — Other Ambulatory Visit: Payer: Self-pay | Admitting: Cardiology

## 2021-11-22 DIAGNOSIS — I10 Essential (primary) hypertension: Secondary | ICD-10-CM

## 2021-11-23 IMAGING — MG DIGITAL SCREENING BILAT W/ TOMO W/ CAD
6 of 10 series · 6 of 30 positions shown · non-contrast
Comparison: Previous exam(s).

CLINICAL DATA: Screening.

EXAM:
DIGITAL SCREENING BILATERAL MAMMOGRAM WITH TOMO AND CAD

[L MLO synth-2D (1 of 2)]
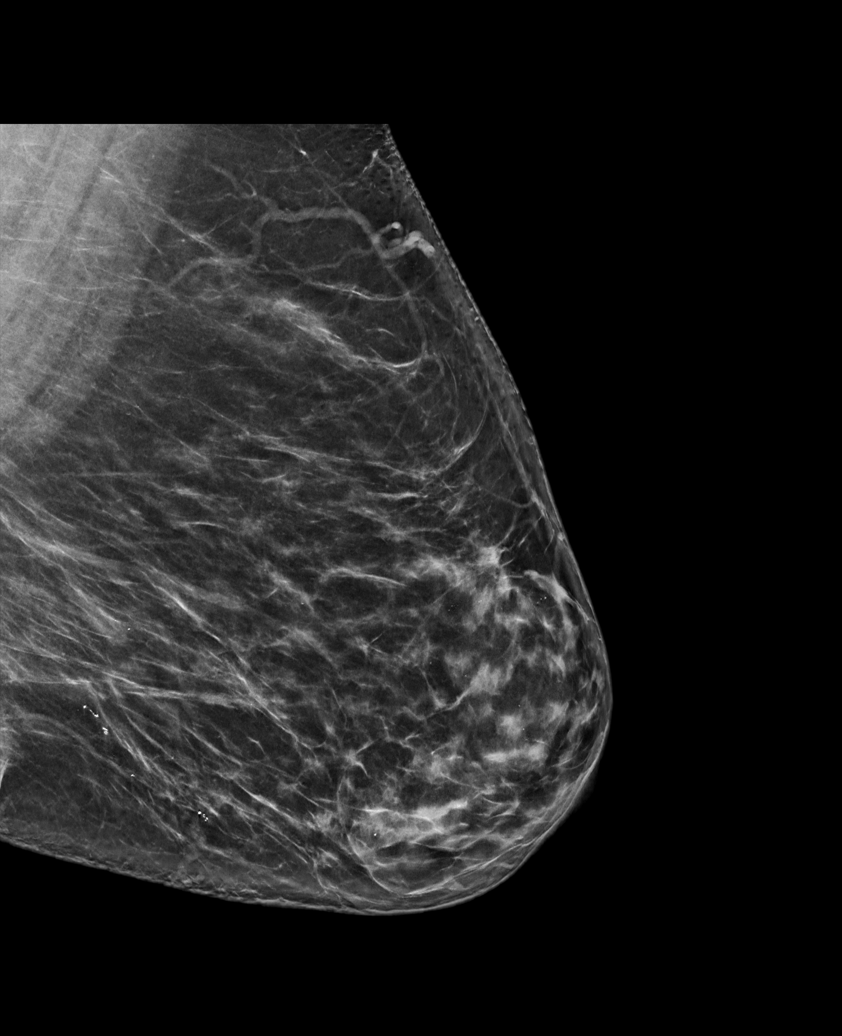

[R MLO synth-2D]
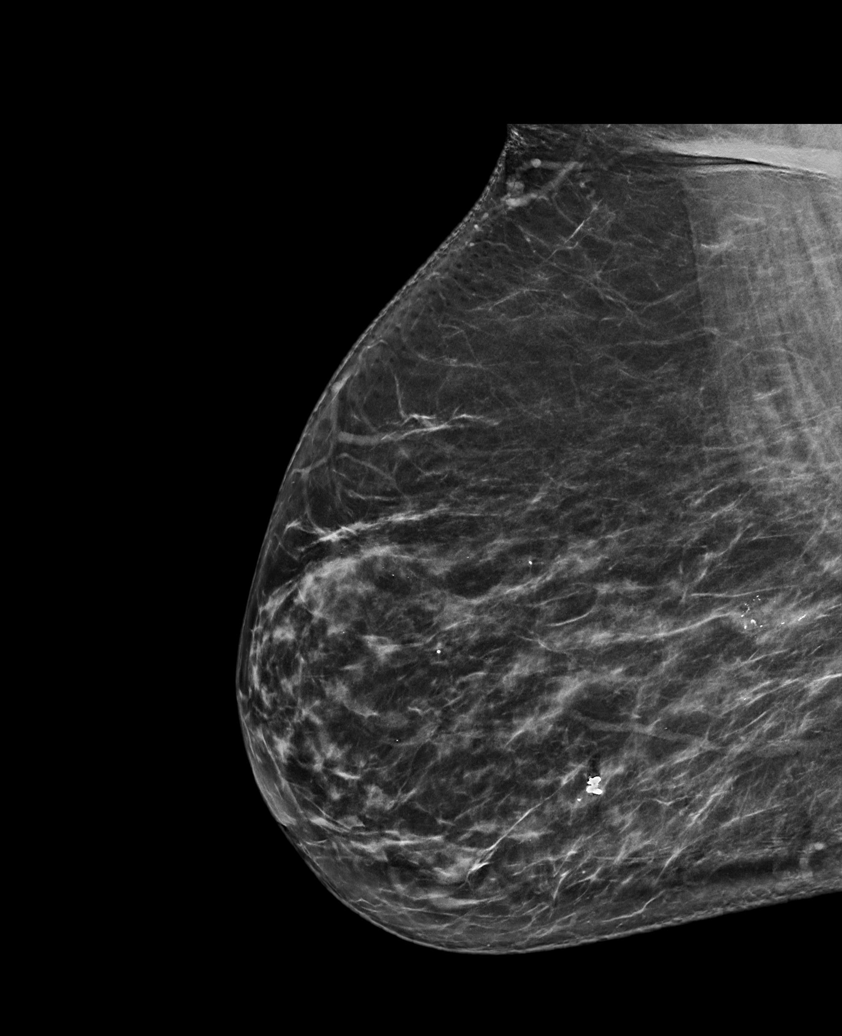

[L MLO synth-2D (2 of 2)]
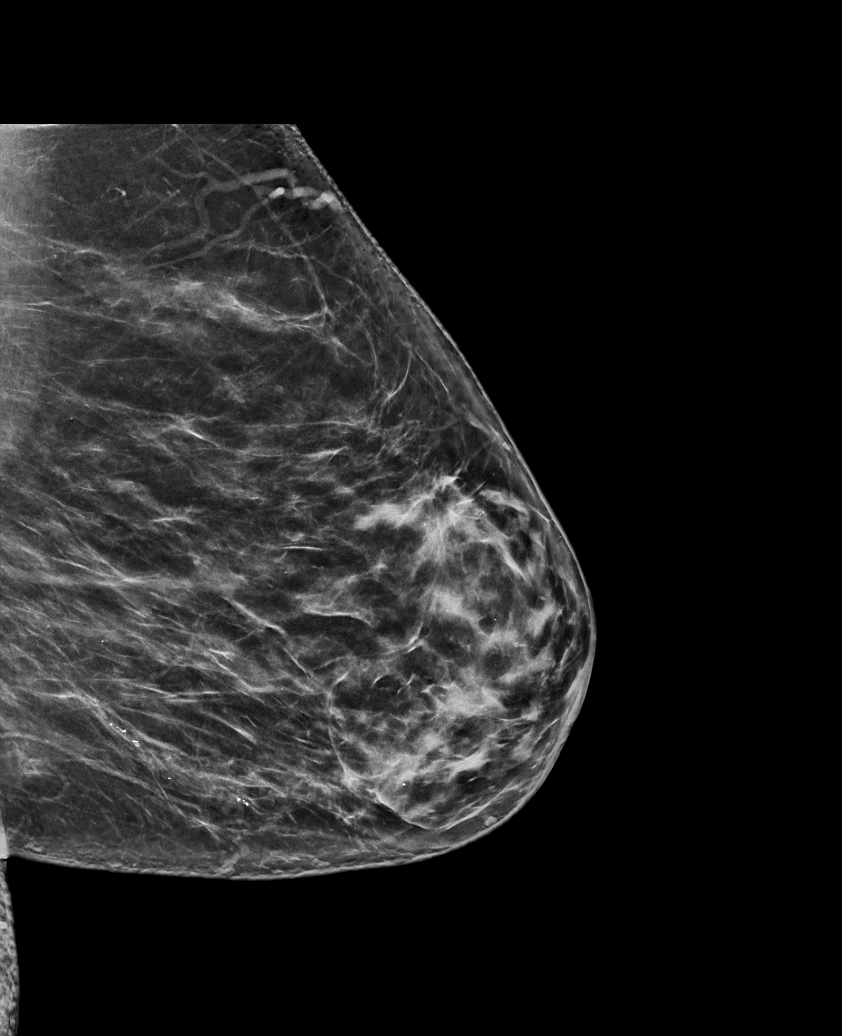

[L CC synth-2D]
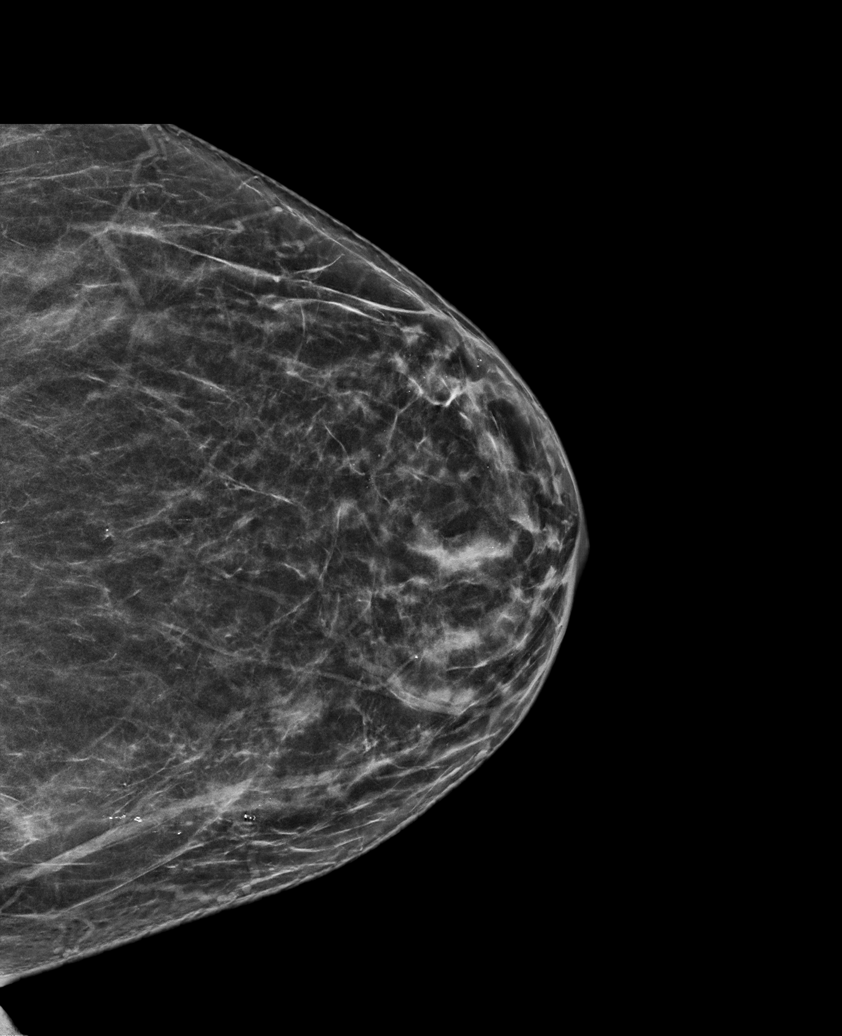

[R CC synth-2D]
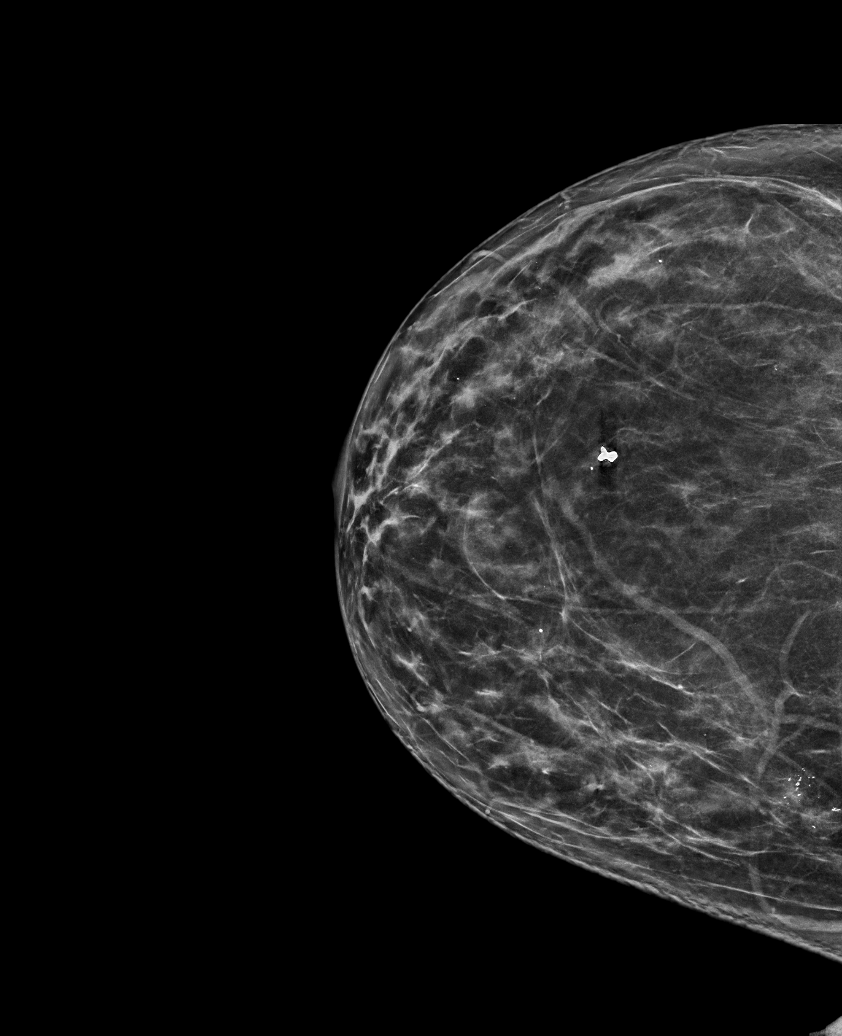

[L MLO tomo · tomo slice 41/81.0]
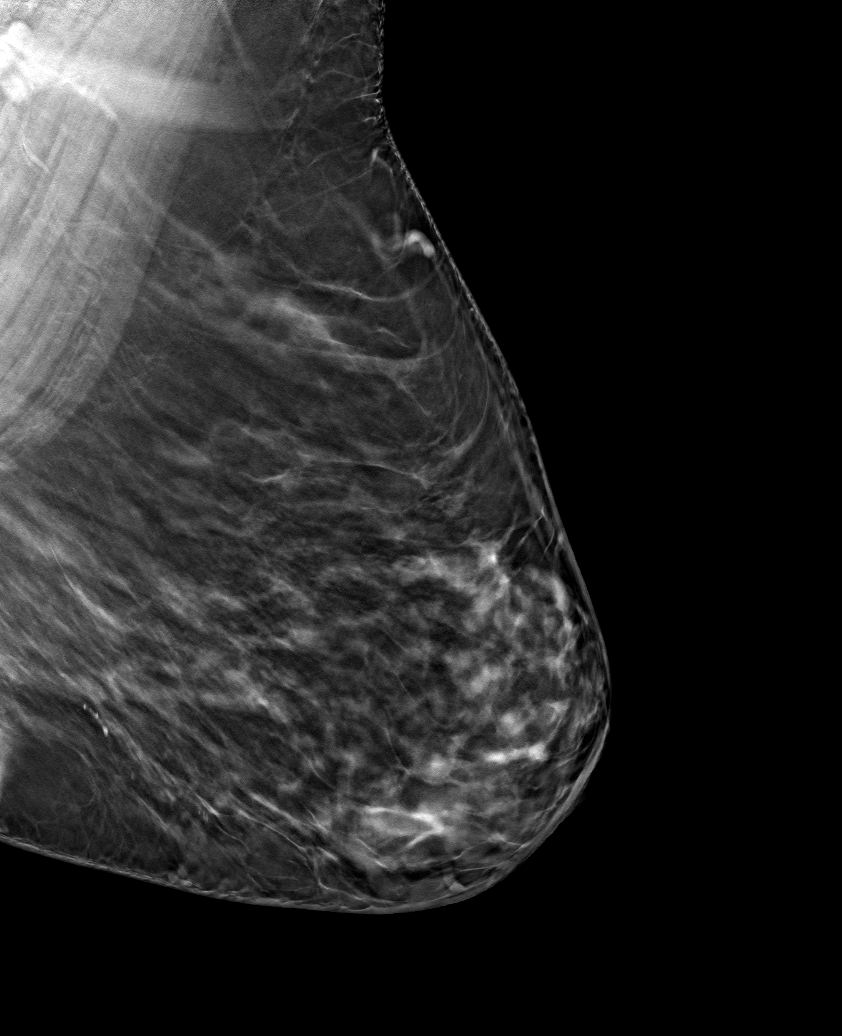

[6 of 30 positions shown; findings below may reference images not displayed]

ACR Breast Density Category b: There are scattered areas of
fibroglandular density.
FINDINGS: In both breasts, calcifications warrant further evaluation with
magnified views. Images were processed with CAD.
IMPRESSION: Further evaluation is suggested for calcifications in the right and
left breasts.

RECOMMENDATION:
Diagnostic mammogram of the right and left breasts.  (Code:[id number])

The patient will be contacted regarding the findings, and additional
imaging will be scheduled.

BI-RADS CATEGORY  0: Incomplete. Need additional imaging evaluation
and/or prior mammograms for comparison.

## 2021-11-26 ENCOUNTER — Ambulatory Visit: Payer: Medicare Other | Admitting: Cardiology

## 2021-12-19 ENCOUNTER — Other Ambulatory Visit: Payer: Self-pay | Admitting: Nurse Practitioner

## 2021-12-26 ENCOUNTER — Other Ambulatory Visit: Payer: Self-pay | Admitting: Nurse Practitioner

## 2022-01-05 ENCOUNTER — Other Ambulatory Visit: Payer: Self-pay | Admitting: Nurse Practitioner

## 2022-01-05 DIAGNOSIS — Z1231 Encounter for screening mammogram for malignant neoplasm of breast: Secondary | ICD-10-CM

## 2022-02-27 ENCOUNTER — Ambulatory Visit
Admission: RE | Admit: 2022-02-27 | Discharge: 2022-02-27 | Disposition: A | Discharge status: Home / Self Care | Payer: Medicare Other | Source: Ambulatory Visit | Attending: Nurse Practitioner | Admitting: Nurse Practitioner

## 2022-02-27 DIAGNOSIS — Z1231 Encounter for screening mammogram for malignant neoplasm of breast: Secondary | ICD-10-CM

## 2022-03-19 ENCOUNTER — Encounter: Payer: Self-pay | Admitting: Nurse Practitioner

## 2022-03-20 ENCOUNTER — Ambulatory Visit: Payer: Medicare Other | Admitting: Nurse Practitioner

## 2022-03-23 ENCOUNTER — Ambulatory Visit (INDEPENDENT_AMBULATORY_CARE_PROVIDER_SITE_OTHER): Payer: Medicare Other | Admitting: Nurse Practitioner

## 2022-03-23 ENCOUNTER — Encounter: Payer: Self-pay | Admitting: Nurse Practitioner

## 2022-03-23 VITALS — BP 134/68 | HR 60 | Temp 97.8°F | Resp 17 | Ht 67.0 in | Wt 276.4 lb

## 2022-03-23 DIAGNOSIS — I4819 Other persistent atrial fibrillation: Secondary | ICD-10-CM

## 2022-03-23 DIAGNOSIS — E782 Mixed hyperlipidemia: Secondary | ICD-10-CM

## 2022-03-23 DIAGNOSIS — I10 Essential (primary) hypertension: Secondary | ICD-10-CM | POA: Diagnosis not present

## 2022-03-23 DIAGNOSIS — M65332 Trigger finger, left middle finger: Secondary | ICD-10-CM

## 2022-03-23 DIAGNOSIS — R739 Hyperglycemia, unspecified: Secondary | ICD-10-CM | POA: Diagnosis not present

## 2022-03-23 DIAGNOSIS — D6869 Other thrombophilia: Secondary | ICD-10-CM

## 2022-03-23 NOTE — Patient Instructions (Signed)
w

## 2022-03-23 NOTE — Progress Notes (Deleted)
Careteam: Patient Care Team: Lauree Chandler, NP as PCP - General (Geriatric Medicine)  PLACE OF SERVICE:  [place] Directive information Does Patient Have a Medical Advance Directive?: No, Would patient like information on creating a medical advance directive?: No - Patient declined  Allergies  Allergen Reactions   Amlodipine Other (See Comments)    Leg edema    Chief Complaint  Patient presents with   Medical Management of Chronic Issues    4 month follow-up. Discuss need for td/tdap, pneumonia vaccine, AWV (not scheduled) and additional covid boosters. NCIR verified . She is having issues with her middle finger     HPI: Patient is a 77 y.o. female here for a routine visit.  She has lost 6 lbs. She is very active in taking care of her son-in-law who has MS and her grandson who is autistic. She went on a 15-day cruise to [place] and did a lot of walking there.   She is eating lots of chicken and fish. She loves vegetables. She does not add a lot of salt to her food.  Her middle finger on her left hand is bothering her. It is not painful, but it catches, primarily in the morning. She feels it catch on flexion and extension of that hand. In the mornings she has to strain to extend the finger.   Her arthritis feels well-managed at this time. R knee pain is much improved.  Denies bleeding in urine or stool. Constipation well-controlled, bowel movements soft and regular.   Review of Systems:  Review of Systems  Constitutional:  Negative for chills, fever, malaise/fatigue and weight loss.  HENT:  Negative for congestion and sore throat.   Eyes:  Negative for blurred vision.  Respiratory:  Negative for cough, shortness of breath and wheezing.   Cardiovascular:  Negative for chest pain, palpitations and leg swelling.  Gastrointestinal:  Negative for abdominal pain, blood in stool, constipation, diarrhea, heartburn, nausea and vomiting.  Genitourinary:  Negative  for dysuria, frequency, hematuria and urgency.  Musculoskeletal:  Negative for falls and joint pain.       Unable to extend L hand proximal interphalangeal joint intermittently. No pain at this time  Skin:  Negative for rash.  Neurological:  Negative for dizziness, tingling and headaches.  Endo/Heme/Allergies:  Negative for polydipsia.  Psychiatric/Behavioral:  Negative for depression. The patient is not nervous/anxious.     Past Medical History:  Diagnosis Date   Arthritis    self report   Bone fracture    Right Foot   H/O hernia repair    History of hysterectomy    History of mammogram    Dr.Elizabeth Gwenlyn Saran   History of MRI    Dr.Elizabeth Gwenlyn Saran.   History of removal of skin mole 03/09/1997   Hyperlipidemia    Hypertension    Scalp hematoma    Wears glasses    Past Surgical History:  Procedure Laterality Date   ABDOMINAL HYSTERECTOMY     BREAST BIOPSY  03/09/1997   Dr.Finder   BREAST BIOPSY Right 11/07/2019   FIBROADENOMA WITH CALCIFICATIONS   BREAST BIOPSY Left 11/07/2019   FIBROADENOMA WITH CALCIFICATIONS   CARDIOVERSION N/A 12/25/2019   Procedure: CARDIOVERSION;  Surgeon: Adrian Prows, MD;  Location: [facility] ENDOSCOPY;  Service: Cardiovascular;  Laterality: N/A;   CARDIOVERSION N/A 02/06/2020   Procedure: CARDIOVERSION;  Surgeon: Adrian Prows, MD;  Location: [facility];  Service: Cardiovascular;  Laterality: N/A;   CHOLECYSTECTOMY     ~  6 yrs ago   COLONOSCOPY  2009   Dr.Chesley   CYSTECTOMY  09/04/2019   Dr.Gray   GALLBLADDER SURGERY  03/10/2015   Dr.Amid ( [place] )   POLYPECTOMY     12 polyps removed at [facility]  2016   Social History:   reports that she has never smoked. She has never used smokeless tobacco. She reports current alcohol use of about 1.0 standard drink of alcohol per week. She reports that she does not use drugs.  Family History  Problem Relation Age of Onset   Breast cancer Mother    Alzheimer's disease Mother     Dementia Mother    Parkinson's disease Mother    Colon polyps Mother    Stroke Father    Dementia Father    Breast cancer Sister    Allergies Daughter    Allergies Daughter    Allergies Son    Colon cancer Neg Hx    Esophageal cancer Neg Hx    Stomach cancer Neg Hx    Rectal cancer Neg Hx     Medications: Patient's Medications  New Prescriptions   No medications on file  Previous Medications   ACETAMINOPHEN (TYLENOL) 650 MG CR TABLET    Take 1,300 mg by mouth in the morning and at bedtime.   AMIODARONE (PACERONE) 200 MG TABLET    TAKE 1/2 TABLET BY MOUTH DAILY   ASCORBIC ACID (VITAMIN C) 1000 MG TABLET    Take 1,000 mg by mouth daily.   CHOLECALCIFEROL (VITAMIN D3) 25 MCG (1000 UT) CAPS    Take 1,000 Units by mouth daily.    ELIQUIS 5 MG TABS TABLET    TAKE 1 TABLET(5 MG) BY MOUTH TWICE DAILY   METOPROLOL SUCCINATE (TOPROL-XL) 50 MG 24 HR TABLET    TAKE 1 TABLET(50 MG) BY MOUTH TWICE DAILY WITH OR IMMEDIATELY FOLLOWING A MEAL   MISC NATURAL PRODUCTS (ELDERBERRY IMMUNE COMPLEX) CHEW    Chew 50 mg by mouth daily.   MULTIPLE VITAMINS-MINERALS (COMPLETE MULTIVITAMIN/MINERAL PO)    Take 1 tablet by mouth daily. [employer] Vitamin   NON FORMULARY    Apply 1 application topically at bedtime. Veterinary liniment gel   NON FORMULARY    Apply 1 application topically as needed. Body balm with magnesium '12mg'$    OMEGA-3 KRILL OIL PO    Take 350 mg by mouth daily.    PSYLLIUM (METAMUCIL FIBER PO)    Take 10 mLs by mouth 3 (three) times a week. With water or Juice   ROSUVASTATIN (CRESTOR) 10 MG TABLET    Take 1 tablet (10 mg total) by mouth daily.   SPIRONOLACTONE (ALDACTONE) 25 MG TABLET    Take 1 tablet (25 mg total) by mouth every morning.   VALSARTAN-HYDROCHLOROTHIAZIDE (DIOVAN-HCT) 320-25 MG TABLET    TAKE 1 TABLET BY MOUTH EVERY MORNING   VITAMIN B-12 (CYANOCOBALAMIN) 1000 MCG TABLET    Take 1,000 mcg by mouth daily.   ZINC 50 MG TABS    Take 1 tablet by mouth daily.  Modified  Medications   No medications on file  Discontinued Medications   No medications on file    Physical Exam:  Vitals:   03/23/22 0909  BP: 134/68  Pulse: 60  Resp: 17  Temp: 97.8 F (36.6 C)  TempSrc: Temporal  SpO2: 97%  Weight: 276 lb 6.4 oz (125.4 kg)  Height: '5\' 7"'$  (1.702 m)   Body mass index is 43.29 kg/m.  Wt Readings from Last 3 Encounters:  03/23/22 276 lb 6.4 oz (125.4 kg)  11/20/21 282 lb 6.4 oz (128.1 kg)  11/14/21 280 lb 6.4 oz (127.2 kg)    Physical Exam Constitutional:      General: She is not in acute distress.    Appearance: Normal appearance.  Cardiovascular:     Rate and Rhythm: Normal rate and regular rhythm.  Pulmonary:     Effort: No respiratory distress.     Breath sounds: Normal breath sounds.  Abdominal:     General: Bowel sounds are normal. There is no distension.     Palpations: Abdomen is soft. There is no mass.     Tenderness: There is no abdominal tenderness. There is no guarding.  Musculoskeletal:     Cervical back: Neck supple.  Lymphadenopathy:     Cervical: No cervical adenopathy.  Skin:    General: Skin is warm and dry.  Neurological:     Mental Status: She is alert and oriented to person, place, and time.  Psychiatric:        Mood and Affect: Mood normal.   ***  Labs reviewed: Basic Metabolic Panel: Recent Labs    11/14/21 1012  NA 140  K 3.8  CL 105  CO2 27  GLUCOSE 108*  BUN 12  CREATININE 1.05*  CALCIUM 9.3   Liver Function Tests: Recent Labs    11/14/21 1012  AST 21  ALT 20  BILITOT 0.4  PROT 6.7   No results for input(s): "LIPASE", "AMYLASE" in the last 8760 hours. No results for input(s): "AMMONIA" in the last 8760 hours. CBC: Recent Labs    11/14/21 1012  WBC 5.1  NEUTROABS 2,769  HGB 13.0  HCT 40.0  MCV 85.1  PLT 190   Lipid Panel: Recent Labs    11/14/21 1012  CHOL 191  HDL 52  LDLCALC 115*  TRIG 126  CHOLHDL 3.7   TSH: No results for input(s): "TSH" in the last 8760  hours. A1C: Lab Results  Component Value Date   HGBA1C 5.9 (H) 11/14/2021     Assessment/Plan There are no diagnoses linked to this encounter.  No follow-ups on file.:  1. Persistent atrial fibrillation (HCC) Denies CP, SOB, Palpitations. Continues to follow with Dr. Einar Gip and his recommendations.  2. Essential hypertension BP controlled on current regimen. Continue medications in combination with dietary and lifestyle modifications. - CBC with Differential/Platelet - CMP with eGFR([facility])  3. Mixed hyperlipidemia Continue rosuvastatin. Check labs today due to increased dose at last visit. Reports eating lean meats such as chicken and fish. Continue medication as well as dietary and lifestyle modifications. - Lipid panel  4. Hyperglycemia Discussed cutting back on soft drinks today. She is in agreement to try slowly cutting back. Encouraged patient to attempt short walks in addition to being active with family, especially as weather gets warmer. Check labs today. - Hemoglobin A1c   Student- Christoper Bushey O'Berry ACPCNP-S  I personally was present during the history, physical exam and medical decision-making activities of this service and have verified that the service and findings are accurately documented in the student's note Jessica K. [place], [facility] [phone]

## 2022-03-23 NOTE — Progress Notes (Signed)
Careteam: Patient Care Team: Lauree Chandler, NP as PCP - General (Geriatric Medicine)  PLACE OF SERVICE:  [place] Directive information Does Patient Have a Medical Advance Directive?: No, Would patient like information on creating a medical advance directive?: No - Patient declined  Allergies  Allergen Reactions   Amlodipine Other (See Comments)    Leg edema    Chief Complaint  Patient presents with   Medical Management of Chronic Issues    4 month follow-up. Discuss need for td/tdap, pneumonia vaccine, AWV (not scheduled) and additional covid boosters. NCIR verified . She is having issues with her middle finger     HPI: Patient is a 77 y.o. female here for a routine visit.  She has lost 6 lbs. She is very active in taking care of her son-in-law who has MS and her grandson who is autistic. She went on a 15-day cruise to [place] and did a lot of walking there.   She is eating lots of chicken and fish. She loves vegetables. She does not add a lot of salt to her food.  Her middle finger on her left hand is bothering her. It is not painful, but it catches, primarily in the morning. She feels it catch on flexion and extension of that hand. In the mornings she has to strain to extend the finger.   Her arthritis feels well-managed at this time. R knee pain is much improved.  Denies bleeding in urine or stool. Constipation well-controlled, bowel movements soft and regular.   Review of Systems:  Review of Systems  Constitutional:  Negative for chills, fever, malaise/fatigue and weight loss.  HENT:  Negative for congestion and sore throat.   Eyes:  Negative for blurred vision.  Respiratory:  Negative for cough, shortness of breath and wheezing.   Cardiovascular:  Negative for chest pain, palpitations and leg swelling.  Gastrointestinal:  Negative for abdominal pain, blood in stool, constipation, diarrhea, heartburn, nausea and vomiting.  Genitourinary:  Negative  for dysuria, frequency, hematuria and urgency.  Musculoskeletal:  Negative for falls and joint pain.       Unable to extend L hand proximal interphalangeal joint intermittently. No pain at this time  Skin:  Negative for rash.  Neurological:  Negative for dizziness, tingling and headaches.  Endo/Heme/Allergies:  Negative for polydipsia.  Psychiatric/Behavioral:  Negative for depression. The patient is not nervous/anxious.     Past Medical History:  Diagnosis Date   Arthritis    self report   Bone fracture    Right Foot   H/O hernia repair    History of hysterectomy    History of mammogram    Dr.Elizabeth Gwenlyn Saran   History of MRI    Dr.Elizabeth Gwenlyn Saran.   History of removal of skin mole 03/09/1997   Hyperlipidemia    Hypertension    Scalp hematoma    Wears glasses    Past Surgical History:  Procedure Laterality Date   ABDOMINAL HYSTERECTOMY     BREAST BIOPSY  03/09/1997   Dr.Finder   BREAST BIOPSY Right 11/07/2019   FIBROADENOMA WITH CALCIFICATIONS   BREAST BIOPSY Left 11/07/2019   FIBROADENOMA WITH CALCIFICATIONS   CARDIOVERSION N/A 12/25/2019   Procedure: CARDIOVERSION;  Surgeon: Adrian Prows, MD;  Location: [facility] ENDOSCOPY;  Service: Cardiovascular;  Laterality: N/A;   CARDIOVERSION N/A 02/06/2020   Procedure: CARDIOVERSION;  Surgeon: Adrian Prows, MD;  Location: [facility];  Service: Cardiovascular;  Laterality: N/A;   CHOLECYSTECTOMY     ~  6 yrs ago   COLONOSCOPY  2009   Dr.Chesley   CYSTECTOMY  09/04/2019   Dr.Gray   GALLBLADDER SURGERY  03/10/2015   Dr.Amid ( [place] )   POLYPECTOMY     12 polyps removed at [facility]  2016   Social History:   reports that she has never smoked. She has never used smokeless tobacco. She reports current alcohol use of about 1.0 standard drink of alcohol per week. She reports that she does not use drugs.  Family History  Problem Relation Age of Onset   Breast cancer Mother    Alzheimer's disease Mother     Dementia Mother    Parkinson's disease Mother    Colon polyps Mother    Stroke Father    Dementia Father    Breast cancer Sister    Allergies Daughter    Allergies Daughter    Allergies Son    Colon cancer Neg Hx    Esophageal cancer Neg Hx    Stomach cancer Neg Hx    Rectal cancer Neg Hx     Medications: Patient's Medications  New Prescriptions   No medications on file  Previous Medications   ACETAMINOPHEN (TYLENOL) 650 MG CR TABLET    Take 1,300 mg by mouth in the morning and at bedtime.   AMIODARONE (PACERONE) 200 MG TABLET    TAKE 1/2 TABLET BY MOUTH DAILY   ASCORBIC ACID (VITAMIN C) 1000 MG TABLET    Take 1,000 mg by mouth daily.   CHOLECALCIFEROL (VITAMIN D3) 25 MCG (1000 UT) CAPS    Take 1,000 Units by mouth daily.    ELIQUIS 5 MG TABS TABLET    TAKE 1 TABLET(5 MG) BY MOUTH TWICE DAILY   METOPROLOL SUCCINATE (TOPROL-XL) 50 MG 24 HR TABLET    TAKE 1 TABLET(50 MG) BY MOUTH TWICE DAILY WITH OR IMMEDIATELY FOLLOWING A MEAL   MISC NATURAL PRODUCTS (ELDERBERRY IMMUNE COMPLEX) CHEW    Chew 50 mg by mouth daily.   MULTIPLE VITAMINS-MINERALS (COMPLETE MULTIVITAMIN/MINERAL PO)    Take 1 tablet by mouth daily. [employer] Vitamin   NON FORMULARY    Apply 1 application topically at bedtime. Veterinary liniment gel   NON FORMULARY    Apply 1 application topically as needed. Body balm with magnesium '12mg'$    OMEGA-3 KRILL OIL PO    Take 350 mg by mouth daily.    PSYLLIUM (METAMUCIL FIBER PO)    Take 10 mLs by mouth 3 (three) times a week. With water or Juice   ROSUVASTATIN (CRESTOR) 10 MG TABLET    Take 1 tablet (10 mg total) by mouth daily.   SPIRONOLACTONE (ALDACTONE) 25 MG TABLET    Take 1 tablet (25 mg total) by mouth every morning.   VALSARTAN-HYDROCHLOROTHIAZIDE (DIOVAN-HCT) 320-25 MG TABLET    TAKE 1 TABLET BY MOUTH EVERY MORNING   VITAMIN B-12 (CYANOCOBALAMIN) 1000 MCG TABLET    Take 1,000 mcg by mouth daily.   ZINC 50 MG TABS    Take 1 tablet by mouth daily.  Modified  Medications   No medications on file  Discontinued Medications   No medications on file    Physical Exam:  Vitals:   03/23/22 0909  BP: 134/68  Pulse: 60  Resp: 17  Temp: 97.8 F (36.6 C)  TempSrc: Temporal  SpO2: 97%  Weight: 276 lb 6.4 oz (125.4 kg)  Height: '5\' 7"'$  (1.702 m)   Body mass index is 43.29 kg/m.  Wt Readings from Last 3 Encounters:  03/23/22 276 lb 6.4 oz (125.4 kg)  11/20/21 282 lb 6.4 oz (128.1 kg)  11/14/21 280 lb 6.4 oz (127.2 kg)    Physical Exam Constitutional:      General: She is not in acute distress.    Appearance: Normal appearance.  Cardiovascular:     Rate and Rhythm: Normal rate and regular rhythm.  Pulmonary:     Effort: No respiratory distress.     Breath sounds: Normal breath sounds.  Abdominal:     General: Bowel sounds are normal. There is no distension.     Palpations: Abdomen is soft. There is no mass.     Tenderness: There is no abdominal tenderness. There is no guarding.  Musculoskeletal:     Cervical back: Neck supple.  Lymphadenopathy:     Cervical: No cervical adenopathy.  Skin:    General: Skin is warm and dry.  Neurological:     Mental Status: She is alert and oriented to person, place, and time.  Psychiatric:        Mood and Affect: Mood normal.     Labs reviewed: Basic Metabolic Panel: Recent Labs    11/14/21 1012  NA 140  K 3.8  CL 105  CO2 27  GLUCOSE 108*  BUN 12  CREATININE 1.05*  CALCIUM 9.3    Liver Function Tests: Recent Labs    11/14/21 1012  AST 21  ALT 20  BILITOT 0.4  PROT 6.7    No results for input(s): "LIPASE", "AMYLASE" in the last 8760 hours. No results for input(s): "AMMONIA" in the last 8760 hours. CBC: Recent Labs    11/14/21 1012  WBC 5.1  NEUTROABS 2,769  HGB 13.0  HCT 40.0  MCV 85.1  PLT 190    Lipid Panel: Recent Labs    11/14/21 1012  CHOL 191  HDL 52  LDLCALC 115*  TRIG 126  CHOLHDL 3.7    TSH: No results for input(s): "TSH" in the last 8760  hours. A1C: Lab Results  Component Value Date   HGBA1C 5.9 (H) 11/14/2021     Assessment/Plan 1. Persistent atrial fibrillation (HCC) Stable, rate controlled. Denies CP, SOB, Palpitations. Continues to follow with Dr. Einar Gip and his recommendations.  2. Essential hypertension BP controlled on current regimen. Continue medications in combination with dietary and lifestyle modifications. - CBC with Differential/Platelet - CMP with eGFR([facility])  3. Mixed hyperlipidemia Continue rosuvastatin. Check labs today due to increased dose at last visit. Reports eating lean meats such as chicken and fish. Continue medication as well as dietary and lifestyle modifications. - Lipid panel  4. Hyperglycemia Discussed cutting back on soft drinks today. She is in agreement to try slowly cutting back. Encouraged patient to attempt short walks in addition to being active with family, especially as weather gets warmer. Check labs today. - Hemoglobin A1c  5. Trigger middle finger of left hand Patient unable to take NSAIDS due to long-term use of anticoagulation. Continue tylenol. Information for buddy taping given to patient. Encouraged use of antiinflammatory gel. Patient aware that condition can progress but declines referral or injection at this time.   6. 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 [place], [facility] [phone]

## 2022-03-26 ENCOUNTER — Other Ambulatory Visit: Payer: Self-pay

## 2022-03-26 DIAGNOSIS — D649 Anemia, unspecified: Secondary | ICD-10-CM

## 2022-03-27 LAB — COMPLETE METABOLIC PANEL WITH GFR
AG Ratio: 1.4 (calc) (ref 1.0–2.5)
ALT: 18 U/L (ref 6–29)
AST: 22 U/L (ref 10–35)
Albumin: 3.9 g/dL (ref 3.6–5.1)
Alkaline phosphatase (APISO): 50 U/L (ref 37–153)
BUN: 14 mg/dL (ref 7–25)
CO2: 29 mmol/L (ref 20–32)
Calcium: 9.7 mg/dL (ref 8.6–10.4)
Chloride: 106 mmol/L (ref 98–110)
Creat: 0.97 mg/dL (ref 0.60–1.00)
Globulin: 2.7 g/dL (calc) (ref 1.9–3.7)
Glucose, Bld: 98 mg/dL (ref 65–139)
Potassium: 4 mmol/L (ref 3.5–5.3)
Sodium: 141 mmol/L (ref 135–146)
Total Bilirubin: 0.4 mg/dL (ref 0.2–1.2)
Total Protein: 6.6 g/dL (ref 6.1–8.1)
eGFR: 61 mL/min/{1.73_m2} (ref >=60)

## 2022-03-27 LAB — CBC WITH DIFFERENTIAL/PLATELET
Absolute Monocytes: 303 cells/uL (ref 200–950)
Basophils Absolute: 21 cells/uL (ref 0–200)
Basophils Relative: 0.5 %
Eosinophils Absolute: 49 cells/uL (ref 15–500)
Eosinophils Relative: 1.2 %
HCT: 33.1 % — ABNORMAL LOW (ref 35.0–45.0)
Hemoglobin: 10.8 g/dL — ABNORMAL LOW (ref 11.7–15.5)
Lymphs Abs: 1394 cells/uL (ref 850–3900)
MCH: 28 pg (ref 27.0–33.0)
MCHC: 32.6 g/dL (ref 32.0–36.0)
MCV: 85.8 fL (ref 80.0–100.0)
MPV: 10.9 fL (ref 7.5–12.5)
Monocytes Relative: 7.4 %
Neutro Abs: 2333 cells/uL (ref 1500–7800)
Neutrophils Relative %: 56.9 %
Platelets: 189 10*3/uL (ref 140–400)
RBC: 3.86 10*6/uL (ref 3.80–5.10)
RDW: 14.4 % (ref 11.0–15.0)
Total Lymphocyte: 34 %
WBC: 4.1 10*3/uL (ref 3.8–10.8)

## 2022-03-27 LAB — LIPID PANEL
Cholesterol: 145 mg/dL (ref <200)
HDL: 43 mg/dL — ABNORMAL LOW (ref >=50)
LDL Cholesterol (Calc): 79 mg/dL (calc)
Non-HDL Cholesterol (Calc): 102 mg/dL (calc) (ref <130)
Total CHOL/HDL Ratio: 3.4 (calc) (ref <5.0)
Triglycerides: 129 mg/dL (ref <150)

## 2022-03-27 LAB — TEST AUTHORIZATION

## 2022-03-27 LAB — IRON,TIBC AND FERRITIN PANEL
%SAT: 28 % (calc) (ref 16–45)
Ferritin: 212 ng/mL (ref 16–288)
Iron: 81 ug/dL (ref 45–160)
TIBC: 292 mcg/dL (calc) (ref 250–450)

## 2022-03-27 LAB — HEMOGLOBIN A1C
Hgb A1c MFr Bld: 6.4 % of total Hgb — ABNORMAL HIGH (ref <5.7)
Mean Plasma Glucose: 137 mg/dL
eAG (mmol/L): 7.6 mmol/L

## 2022-04-01 ENCOUNTER — Encounter: Payer: Self-pay | Admitting: Family

## 2022-04-01 ENCOUNTER — Other Ambulatory Visit: Payer: Medicare Other

## 2022-04-01 ENCOUNTER — Ambulatory Visit (INDEPENDENT_AMBULATORY_CARE_PROVIDER_SITE_OTHER): Payer: Medicare Other | Admitting: Family

## 2022-04-01 VITALS — BP 118/64 | HR 61 | Temp 96.9°F | Resp 18 | Ht 67.0 in | Wt 276.6 lb

## 2022-04-01 DIAGNOSIS — M65332 Trigger finger, left middle finger: Secondary | ICD-10-CM

## 2022-04-01 NOTE — Progress Notes (Signed)
Provider: Assyria Morreale FNP-C  Lauree Chandler, NP  Patient Care Team: Lauree Chandler, NP as PCP - General (Geriatric Medicine)  Extended Emergency Contact Information Primary Emergency Contact: [place] Phone: [phone] Mobile Phone: [phone] Relation: Spouse Secondary Emergency Contact: [place] Phone: [phone] Mobile Phone: [phone] Relation: Daughter  Code Status:  Full Code  Goals of care: Advanced Directive information    04/01/2022    2:28 PM  Advanced Directives  Does Patient Have a Medical Advance Directive? Yes  Type of Advance Directive Out of facility DNR (pink MOST or yellow form)  Does patient want to make changes to medical advance directive? No - Patient declined     Chief Complaint  Patient presents with   Acute Visit    Patient complains of pain in both hands.     HPI:  Pt is a 77 y.o. female seen today for an acute visit for evaluation of left hand pain  middle trigger finger.describe finger as throbbing.states heating pad helps.pain started 2 weeks ago.Had popping in December,2023.Has noticed little swelling.states pain keeps her up at night.  Right thigh anterior numbness.    Past Medical History:  Diagnosis Date   Arthritis    self report   Bone fracture    Right Foot   H/O hernia repair    History of hysterectomy    History of mammogram    Dr.Elizabeth Gwenlyn Saran   History of MRI    Dr.Elizabeth Gwenlyn Saran.   History of removal of skin mole 03/09/1997   Hyperlipidemia    Hypertension    Scalp hematoma    Wears glasses    Past Surgical History:  Procedure Laterality Date   ABDOMINAL HYSTERECTOMY     BREAST BIOPSY  03/09/1997   Dr.Finder   BREAST BIOPSY Right 11/07/2019   FIBROADENOMA WITH CALCIFICATIONS   BREAST BIOPSY Left 11/07/2019   FIBROADENOMA WITH CALCIFICATIONS   CARDIOVERSION N/A 12/25/2019   Procedure: CARDIOVERSION;  Surgeon: Adrian Prows, MD;  Location: [facility] ENDOSCOPY;  Service:  Cardiovascular;  Laterality: N/A;   CARDIOVERSION N/A 02/06/2020   Procedure: CARDIOVERSION;  Surgeon: Adrian Prows, MD;  Location: [facility] ENDOSCOPY;  Service: Cardiovascular;  Laterality: N/A;   CHOLECYSTECTOMY     ~ 6 yrs ago   COLONOSCOPY  2009   Dr.Chesley   CYSTECTOMY  09/04/2019   Dr.Gray   GALLBLADDER SURGERY  03/10/2015   Dr.Amid ( [place] )   POLYPECTOMY     12 polyps removed at [place]  2016    Allergies  Allergen Reactions   Amlodipine Other (See Comments)    Leg edema    Outpatient Encounter Medications as of 04/01/2022  Medication Sig   acetaminophen (TYLENOL) 650 MG CR tablet Take 1,300 mg by mouth in the morning and at bedtime.   amiodarone (PACERONE) 200 MG tablet TAKE 1/2 TABLET BY MOUTH DAILY   Ascorbic Acid (VITAMIN C) 1000 MG tablet Take 1,000 mg by mouth daily.   Cholecalciferol (VITAMIN D3) 25 MCG (1000 UT) CAPS Take 1,000 Units by mouth daily.    ELIQUIS 5 MG TABS tablet TAKE 1 TABLET(5 MG) BY MOUTH TWICE DAILY   metoprolol succinate (TOPROL-XL) 50 MG 24 hr tablet TAKE 1 TABLET(50 MG) BY MOUTH TWICE DAILY WITH OR IMMEDIATELY FOLLOWING A MEAL   Misc Natural Products (ELDERBERRY IMMUNE COMPLEX) CHEW Chew 50 mg by mouth daily.   Multiple Vitamins-Minerals (COMPLETE MULTIVITAMIN/MINERAL PO) Take 1 tablet by mouth daily. [employer] Vitamin  NON FORMULARY Apply 1 application topically at bedtime. Veterinary liniment gel   NON FORMULARY Apply 1 application topically as needed. Body balm with magnesium '12mg'$    OMEGA-3 KRILL OIL PO Take 350 mg by mouth daily.    Psyllium (METAMUCIL FIBER PO) Take 10 mLs by mouth 3 (three) times a week. With water or Juice   rosuvastatin (CRESTOR) 10 MG tablet Take 1 tablet (10 mg total) by mouth daily.   spironolactone (ALDACTONE) 25 MG tablet Take 1 tablet (25 mg total) by mouth every morning.   valsartan-hydrochlorothiazide (DIOVAN-HCT) 320-25 MG tablet TAKE 1 TABLET BY MOUTH EVERY MORNING   vitamin  B-12 (CYANOCOBALAMIN) 1000 MCG tablet Take 1,000 mcg by mouth daily.   Zinc 50 MG TABS Take 1 tablet by mouth daily.   No facility-administered encounter medications on file as of 04/01/2022.    Review of Systems  Constitutional:  Negative for appetite change, chills, fatigue, fever and unexpected weight change.  Respiratory:  Negative for cough, chest tightness, shortness of breath and wheezing.   Cardiovascular:  Negative for chest pain, palpitations and leg swelling.  Musculoskeletal:  Negative for arthralgias, back pain, gait problem, joint swelling and myalgias.       Left had middle trigger finger   Skin:  Negative for color change, pallor and rash.  Neurological:  Negative for weakness and numbness.    Immunization History  Administered Date(s) Administered   Fluad Quad(high Dose 65+) 12/06/2019, 11/14/2021   PFIZER(Purple Top)SARS-COV-2 Vaccination 03/31/2019, 04/21/2019, 01/04/2020   Zoster Recombinat (Shingrix) 08/10/2019, 10/10/2019   Pertinent  Health Maintenance Due  Topic Date Due   COLONOSCOPY (Pts 45-66yr Insurance coverage will need to be confirmed)  10/04/2022   INFLUENZA VACCINE  Completed   DEXA SCAN  Completed      09/17/2020   12:50 PM 01/08/2021   11:18 AM 11/14/2021    9:29 AM 03/23/2022    9:13 AM 04/01/2022    2:28 PM  Fall Risk  Falls in the past year? 1 0 0 0 0  Was there an injury with Fall? 0 0 0 0 0  Fall Risk Category Calculator 2 0 0 0 0  Fall Risk Category (Retired) Moderate Low Low Low   (RETIRED) Patient Fall Risk Level Low fall risk Low fall risk Low fall risk    Patient at Risk for Falls Due to History of fall(s) No Fall Risks No Fall Risks History of fall(s) No Fall Risks  Fall risk Follow up Falls evaluation completed;Education provided;Falls prevention discussed Falls evaluation completed;Education provided;Falls prevention discussed Falls evaluation completed Falls evaluation completed Falls evaluation completed   Functional Status  Survey:    Vitals:   04/01/22 1426  BP: 118/64  Pulse: 61  Resp: 18  Temp: (!) 96.9 F (36.1 C)  SpO2: 95%  Weight: 276 lb 9.6 oz (125.5 kg)  Height: '5\' 7"'$  (1.702 m)   Body mass index is 43.32 kg/m. Physical Exam Vitals reviewed.  Constitutional:      General: She is not in acute distress.    Appearance: Normal appearance. She is obese. She is not ill-appearing or diaphoretic.  HENT:     Head: Normocephalic.  Eyes:     General: No scleral icterus.       Right eye: No discharge.        Left eye: No discharge.     Conjunctiva/sclera: Conjunctivae normal.     Pupils: Pupils are equal, round, and reactive to light.  Neck:  Vascular: No carotid bruit.  Cardiovascular:     Rate and Rhythm: Normal rate and regular rhythm.     Pulses: Normal pulses.     Heart sounds: Normal heart sounds. No murmur heard.    No friction rub. No gallop.  Pulmonary:     Effort: Pulmonary effort is normal. No respiratory distress.     Breath sounds: Normal breath sounds. No wheezing, rhonchi or rales.  Chest:     Chest wall: No tenderness.  Abdominal:     General: Bowel sounds are normal. There is no distension.     Palpations: Abdomen is soft. There is no mass.     Tenderness: There is no abdominal tenderness. There is no right CVA tenderness, left CVA tenderness, guarding or rebound.  Musculoskeletal:        General: No swelling or tenderness. Normal range of motion.     Cervical back: Normal range of motion. No rigidity or tenderness.     Right lower leg: No edema.     Left lower leg: No edema.     Comments: Left middle finger without any erythema and no swelling.  Lymphadenopathy:     Cervical: No cervical adenopathy.  Skin:    General: Skin is warm and dry.     Coloration: Skin is not pale.     Findings: No erythema or rash.  Neurological:     Mental Status: She is alert and oriented to person, place, and time.     Gait: Gait normal.  Psychiatric:        Mood and Affect: Mood  normal.        Speech: Speech normal.        Behavior: Behavior normal.     Labs reviewed: Recent Labs    11/14/21 1012 03/23/22 1013  NA 140 141  K 3.8 4.0  CL 105 106  CO2 27 29  GLUCOSE 108* 98  BUN 12 14  CREATININE 1.05* 0.97  CALCIUM 9.3 9.7   Recent Labs    11/14/21 1012 03/23/22 1013  AST 21 22  ALT 20 18  BILITOT 0.4 0.4  PROT 6.7 6.6   Recent Labs    11/14/21 1012 03/23/22 1013  WBC 5.1 4.1  NEUTROABS 2,769 2,333  HGB 13.0 10.8*  HCT 40.0 33.1*  MCV 85.1 85.8  PLT 190 189   Lab Results  Component Value Date   TSH 0.95 10/04/2019   Lab Results  Component Value Date   HGBA1C 6.4 (H) 03/23/2022   Lab Results  Component Value Date   CHOL 145 03/23/2022   HDL 43 (L) 03/23/2022   LDLCALC 79 03/23/2022   TRIG 129 03/23/2022   CHOLHDL 3.4 03/23/2022    Significant Diagnostic Results in last 30 days:  No results found.  Assessment/Plan  Trigger middle finger of left hand Left middle finger without any erythema and no swelling.Non- tender to palpation - Ambulatory referral to Orthopedic Surgery  Family/ staff Communication: Reviewed plan of care with patient verbalized understanding   Labs/tests ordered: None   Next Appointment: Return if symptoms worsen or fail to improve.   Sandrea Hughs, NP

## 2022-04-16 ENCOUNTER — Ambulatory Visit (INDEPENDENT_AMBULATORY_CARE_PROVIDER_SITE_OTHER): Payer: Medicare Other | Admitting: Orthopedic Surgery

## 2022-04-16 ENCOUNTER — Ambulatory Visit (INDEPENDENT_AMBULATORY_CARE_PROVIDER_SITE_OTHER): Payer: Medicare Other

## 2022-04-16 DIAGNOSIS — M65332 Trigger finger, left middle finger: Secondary | ICD-10-CM

## 2022-04-16 DIAGNOSIS — M79645 Pain in left finger(s): Secondary | ICD-10-CM

## 2022-04-20 ENCOUNTER — Encounter: Payer: Self-pay | Admitting: Nurse Practitioner

## 2022-04-20 ENCOUNTER — Ambulatory Visit (INDEPENDENT_AMBULATORY_CARE_PROVIDER_SITE_OTHER): Payer: Medicare Other | Admitting: Nurse Practitioner

## 2022-04-20 ENCOUNTER — Encounter: Payer: Self-pay | Admitting: Orthopedic Surgery

## 2022-04-20 VITALS — Ht 67.0 in | Wt 270.0 lb

## 2022-04-20 DIAGNOSIS — Z0000 Encounter for general adult medical examination without abnormal findings: Secondary | ICD-10-CM

## 2022-04-20 DIAGNOSIS — E2839 Other primary ovarian failure: Secondary | ICD-10-CM | POA: Diagnosis not present

## 2022-04-20 NOTE — Progress Notes (Signed)
Subjective:   Terry Koch is a 77 y.o. female who presents for Medicare Annual (Subsequent) preventive examination.  Review of Systems     Cardiac Risk Factors include: advanced age (>22mn, >>29women);obesity (BMI >30kg/m2);hypertension;dyslipidemia     Objective:    Today's Vitals   04/20/22 1339 04/20/22 1408  Weight: 270 lb (122.5 kg)   Height: 5' 7"$  (1.702 m)   PainSc:  3    Body mass index is 42.29 kg/m.     04/20/2022    1:32 PM 04/01/2022    2:28 PM 03/23/2022    9:14 AM 11/14/2021    9:29 AM 06/05/2020    1:29 PM 02/06/2020   10:34 AM 12/25/2019    9:23 AM  Advanced Directives  Does Patient Have a Medical Advance Directive? Yes Yes No Yes Yes Yes No  Type of Advance Directive Out of facility DNR (pink MOST or yellow form) Out of facility DNR (pink MOST or yellow form)  Living will Out of facility DNR (pink MOST or yellow form) Living will   Does patient want to make changes to medical advance directive?  No - Patient declined  No - Patient declined No - Patient declined    Would patient like information on creating a medical advance directive?   No - Patient declined    No - Patient declined  Pre-existing out of facility DNR order (yellow form or pink MOST form)     Pink MOST form placed in chart (order not valid for inpatient use)      Current Medications (verified) Outpatient Encounter Medications as of 04/20/2022  Medication Sig   acetaminophen (TYLENOL) 650 MG CR tablet Take 1,300 mg by mouth in the morning and at bedtime.   amiodarone (PACERONE) 200 MG tablet TAKE 1/2 TABLET BY MOUTH DAILY   Ascorbic Acid (VITAMIN C) 1000 MG tablet Take 1,000 mg by mouth daily.   Cholecalciferol (VITAMIN D3) 25 MCG (1000 UT) CAPS Take 1,000 Units by mouth daily.    ELIQUIS 5 MG TABS tablet TAKE 1 TABLET(5 MG) BY MOUTH TWICE DAILY   metoprolol succinate (TOPROL-XL) 50 MG 24 hr tablet TAKE 1 TABLET(50 MG) BY MOUTH TWICE DAILY WITH OR IMMEDIATELY FOLLOWING A MEAL   Misc  Natural Products (ELDERBERRY IMMUNE COMPLEX) CHEW Chew 50 mg by mouth daily.   Multiple Vitamins-Minerals (COMPLETE MULTIVITAMIN/MINERAL PO) Take 1 tablet by mouth daily. [employer] Vitamin   NON FORMULARY Apply 1 application topically at bedtime. Veterinary liniment gel   NON FORMULARY Apply 1 application topically as needed. Body balm with magnesium 110m  OMEGA-3 KRILL OIL PO Take 350 mg by mouth daily.    Psyllium (METAMUCIL FIBER PO) Take 10 mLs by mouth 3 (three) times a week. With water or Juice   rosuvastatin (CRESTOR) 10 MG tablet Take 1 tablet (10 mg total) by mouth daily.   spironolactone (ALDACTONE) 25 MG tablet Take 1 tablet (25 mg total) by mouth every morning.   valsartan-hydrochlorothiazide (DIOVAN-HCT) 320-25 MG tablet TAKE 1 TABLET BY MOUTH EVERY MORNING   vitamin B-12 (CYANOCOBALAMIN) 1000 MCG tablet Take 1,000 mcg by mouth daily.   Zinc 50 MG TABS Take 1 tablet by mouth daily.   No facility-administered encounter medications on file as of 04/20/2022.    Allergies (verified) Amlodipine   History: Past Medical History:  Diagnosis Date   Arthritis    self report   Bone fracture    Right Foot   H/O hernia repair  History of hysterectomy    History of mammogram    Dr.Elizabeth Gwenlyn Saran   History of MRI    Dr.Elizabeth Gwenlyn Saran.   History of removal of skin mole 03/09/1997   Hyperlipidemia    Hypertension    Scalp hematoma    Wears glasses    Past Surgical History:  Procedure Laterality Date   ABDOMINAL HYSTERECTOMY     BREAST BIOPSY  03/09/1997   Dr.Finder   BREAST BIOPSY Right 11/07/2019   FIBROADENOMA WITH CALCIFICATIONS   BREAST BIOPSY Left 11/07/2019   FIBROADENOMA WITH CALCIFICATIONS   CARDIOVERSION N/A 12/25/2019   Procedure: CARDIOVERSION;  Surgeon: Adrian Prows, MD;  Location: [facility] ENDOSCOPY;  Service: Cardiovascular;  Laterality: N/A;   CARDIOVERSION N/A 02/06/2020   Procedure: CARDIOVERSION;  Surgeon: Adrian Prows, MD;  Location: [facility] ENDOSCOPY;   Service: Cardiovascular;  Laterality: N/A;   CHOLECYSTECTOMY     ~ 6 yrs ago   COLONOSCOPY  2009   Dr.Chesley   CYSTECTOMY  09/04/2019   Dr.Gray   GALLBLADDER SURGERY  03/10/2015   Dr.Amid ( [place] )   POLYPECTOMY     12 polyps removed at [place]  2016   Family History  Problem Relation Age of Onset   Breast cancer Mother    Alzheimer's disease Mother    Dementia Mother    Parkinson's disease Mother    Colon polyps Mother    Stroke Father    Dementia Father    Breast cancer Sister    Allergies Daughter    Allergies Daughter    Allergies Son    Colon cancer Neg Hx    Esophageal cancer Neg Hx    Stomach cancer Neg Hx    Rectal cancer Neg Hx    Social History   Socioeconomic History   Marital status: Married    Spouse name: Simona Huh    Number of children: 3   Years of education: 20   Highest education level: Not on file  Occupational History   Not on file  Tobacco Use   Smoking status: Never   Smokeless tobacco: Never  Vaping Use   Vaping Use: Never used  Substance and Sexual Activity   Alcohol use: Yes    Alcohol/week: 1.0 standard drink of alcohol    Types: 1 Glasses of wine per week    Comment: occasionally   Drug use: Never   Sexual activity: Not Currently  Other Topics Concern   Not on file  Social History Narrative   Tobacco use, amount per day now: N/A   Past tobacco use, amount per day: N/A   How many years did you use tobacco: N/A   Alcohol use (drinks per week): Very Limited.   Diet: Regular Vegetables, Meat, Fruits, Milk, Water, and Desserts.   Do you drink/eat things with caffeine: Yes   Marital status:  Married                                What year were you married? 1969   Do you live in a house, apartment, assisted living, condo, trailer, etc.? House.   Is it one or more stories? Ranch ( 1 story )   How many persons live in your home? 2   Do you have pets in your home?( please list) No.    Highest Level of  education completed: Graduate MED   Current or past profession: Education  Do you exercise?  N/A                                   Type and how often?   Do you have a living will? Yes   Do you have a DNR form?   No                                If not, do you want to discuss one?   Do you have signed POA/HPOA forms?  No                      If so, please bring to you appointment    Do you have any difficulty bathing or dressing yourself? No.   Do you have difficulty preparing food or eating? No.   Do you have difficulty managing your medications? No.   Do you have any difficulty managing your finances? No.   Do you have any difficulty affording your medications? No.      Social Determinants of Health   Financial Resource Strain: Not on file  Food Insecurity: Not on file  Transportation Needs: Not on file  Physical Activity: Not on file  Stress: Not on file  Social Connections: Not on file    Tobacco Counseling Counseling given: Not Answered   Clinical Intake:  Pre-visit preparation completed: Yes  Pain : 0-10 Pain Score: 3  Pain Type: Chronic pain Pain Location: Hand Pain Orientation: Left Pain Descriptors / Indicators: Aching Pain Onset: More than a month ago Pain Frequency: Constant     BMI - recorded: 42  How often do you need to have someone help you when you read instructions, pamphlets, or other written materials from your doctor or pharmacy?: 1 - Never  Diabetic?no         Activities of Daily Living    04/20/2022    2:11 PM  In your present state of health, do you have any difficulty performing the following activities:  Hearing? 0  Vision? 0  Difficulty concentrating or making decisions? 0  Walking or climbing stairs? 0  Dressing or bathing? 0  Doing errands, shopping? 0  Preparing Food and eating ? N  Using the Toilet? N  In the past six months, have you accidently leaked urine? N  Do you have problems with loss of bowel control? N   Managing your Medications? N  Managing your Finances? N  Housekeeping or managing your Housekeeping? N    Patient Care Team: Lauree Chandler, NP as PCP - General (Geriatric Medicine)  Indicate any recent Medical Services you may have received from other than [facility] providers in the past year (date may be approximate).     Assessment:   This is a routine wellness examination for [place].  Hearing/Vision screen No results found.  Dietary issues and exercise activities discussed: Current Exercise Habits: The patient does not participate in regular exercise at present   Goals Addressed   None    Depression Screen    04/20/2022    1:34 PM 03/23/2022    9:14 AM 12/06/2019    1:07 PM 09/01/2019    2:27 PM 07/17/2019    8:39 AM  PHQ 2/9 Scores  PHQ - 2 Score 0 0 0 0 0  PHQ- 9 Score 0  Fall Risk    04/20/2022    1:33 PM 04/01/2022    2:28 PM 03/23/2022    9:13 AM 11/14/2021    9:29 AM 01/08/2021   11:18 AM  Fall Risk   Falls in the past year? 0 0 0 0 0  Number falls in past yr: 0 0 0 0 0  Injury with Fall? 0 0 0 0 0  Risk for fall due to : No Fall Risks No Fall Risks History of fall(s) No Fall Risks No Fall Risks  Follow up Falls evaluation completed Falls evaluation completed Falls evaluation completed Falls evaluation completed Falls evaluation completed;Education provided;Falls prevention discussed    FALL RISK PREVENTION PERTAINING TO THE HOME:  Any stairs in or around the home? Yes  If so, are there any without handrails? No  Home free of loose throw rugs in walkways, pet beds, electrical cords, etc? Yes  Adequate lighting in your home to reduce risk of falls? Yes   ASSISTIVE DEVICES UTILIZED TO PREVENT FALLS:  Life alert? No  Use of a cane, walker or w/c? No  Grab bars in the bathroom? Yes  Shower chair or bench in shower? Yes  Elevated toilet seat or a handicapped toilet? Yes   TIMED UP AND GO:  Was the test performed? No .    Cognitive  Function:    07/17/2019    8:41 AM  MMSE - Mini Mental State Exam  Orientation to time 4  Orientation to Place 5  Registration 3  Attention/ Calculation 5  Recall 1  Language- name 2 objects 2  Language- repeat 1  Language- follow 3 step command 3  Language- read & follow direction 1  Write a sentence 1  Copy design 1  Total score 27        04/20/2022    1:34 PM  6CIT Screen  What Year? 0 points  What month? 0 points  What time? 0 points  Count back from 20 2 points  Months in reverse 0 points  Repeat phrase 0 points  Total Score 2 points    Immunizations Immunization History  Administered Date(s) Administered   Fluad Quad(high Dose 65+) 12/06/2019, 11/14/2021   PFIZER(Purple Top)SARS-COV-2 Vaccination 03/31/2019, 04/21/2019, 01/04/2020   Zoster Recombinat (Shingrix) 08/10/2019, 10/10/2019    TDAP status: Due, Education has been provided regarding the importance of this vaccine. Advised may receive this vaccine at local pharmacy or Health Dept. Aware to provide a copy of the vaccination record if obtained from local pharmacy or Health Dept. Verbalized acceptance and understanding.  Flu Vaccine status: Up to date  Pneumococcal vaccine status: Due, Education has been provided regarding the importance of this vaccine. Advised may receive this vaccine at local pharmacy or Health Dept. Aware to provide a copy of the vaccination record if obtained from local pharmacy or Health Dept. Verbalized acceptance and understanding.  Covid-19 vaccine status: Information provided on how to obtain vaccines.   Qualifies for Shingles Vaccine? Yes   Zostavax completed No   Shingrix Completed?: Yes  Screening Tests Health Maintenance  Topic Date Due   DTaP/Tdap/Td (1 - Tdap) Never done   COVID-19 Vaccine (4 - 2023-24 season) 05/06/2022 (Originally 11/07/2021)   Pneumonia Vaccine 31+ Years old (1 of 1 - PCV) 04/21/2023 (Originally 04/26/2010)   COLONOSCOPY (Pts 45-18yr Insurance  coverage will need to be confirmed)  10/04/2022   Medicare Annual Wellness (AWV)  04/21/2023   INFLUENZA VACCINE  Completed   DEXA SCAN  Completed  Hepatitis C Screening  Completed   Zoster Vaccines- Shingrix  Completed   HPV VACCINES  Aged Out    Health Maintenance  Health Maintenance Due  Topic Date Due   DTaP/Tdap/Td (1 - Tdap) Never done    Colorectal cancer screening: Type of screening: Colonoscopy. Completed 01/04/20. Repeat every 3 years  Mammogram status: No longer required due to age.  Bone Density status: Ordered today. Pt provided with contact info and advised to call to schedule appt.  Lung Cancer Screening: (Low Dose CT Chest recommended if Age 62-80 years, 30 pack-year currently smoking OR have quit w/in 15years.) does not qualify.   Lung Cancer Screening Referral: na  Additional Screening:  Hepatitis C Screening: does qualify; Completed 2021  Vision Screening: Recommended annual ophthalmology exams for early detection of glaucoma and other disorders of the eye. Is the patient up to date with their annual eye exam?  Yes  Who is the provider or what is the name of the office in which the patient attends annual eye exams? [facility] If pt is not established with a provider, would they like to be referred to a provider to establish care? No .   Dental Screening: Recommended annual dental exams for proper oral hygiene  Community Resource Referral / Chronic Care Management: CRR required this visit?  No   CCM required this visit?  No      Plan:     I have personally reviewed and noted the following in the patient's chart:   Medical and social history Use of alcohol, tobacco or illicit drugs  Current medications and supplements including opioid prescriptions. Patient is not currently taking opioid prescriptions. Functional ability and status Nutritional status Physical activity Advanced directives List of other physicians Hospitalizations, surgeries, and  ER visits in previous 12 months Vitals Screenings to include cognitive, depression, and falls Referrals and appointments  In addition, I have reviewed and discussed with patient certain preventive protocols, quality metrics, and best practice recommendations. A written personalized care plan for preventive services as well as general preventive health recommendations were provided to patient.     Lauree Chandler, NP   04/20/2022   Virtual Visit via Video Note  I connected with [place] on 04/20/22 at  2:00 PM EST by a video enabled telemedicine application and verified that I am speaking with the correct person using two identifiers.  Location: Patient: home Provider: [facility]   I discussed the limitations of evaluation and management by telemedicine and the availability of in person appointments. The patient expressed understanding and agreed to proceed.    I discussed the assessment and treatment plan with the patient. The patient was provided an opportunity to ask questions and all were answered. The patient agreed with the plan and demonstrated an understanding of the instructions.   The patient was advised to call back or seek an in-person evaluation if the symptoms worsen or if the condition fails to improve as anticipated.  I provided 15 minutes of non-face-to-face time during this encounter.  Carlos American. Dewaine Oats, AGNP Avs printed and mailed.

## 2022-04-20 NOTE — Progress Notes (Signed)
Office Visit Note   Patient: Terry Koch           Date of Birth: 23-Aug-1945           MRN: [id number] Visit Date: 04/16/2022              Requested by: Sandrea Hughs, NP [address] [place],  [place] [address] PCP: Lauree Chandler, NP  Chief Complaint  Patient presents with   Left Hand - Pain    Middle finger pain      HPI: Patient is a 77 year old woman right-hand-dominant who reports triggering of the left long finger.  Patient states that she took off her ring secondary to swelling.  She states she has had difficulty sleeping secondary to pain.  She states this started about 2 months ago without any injury.  Assessment & Plan: Visit Diagnoses:  1. Pain of left middle finger   2. Trigger finger, left middle finger     Plan: Patient has tried all topical treatment options.  Discussed the possibility of trying a paraffin bath.  Follow-Up Instructions: No follow-ups on file.   Ortho Exam  Patient is alert, oriented, no adenopathy, well-dressed, normal affect, normal respiratory effort. Examination patient has palpable triggering at the A1 pulley left long finger.  There is no redness no cellulitis no swelling the flexor and extensor tendons are nontender to palpation.  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  Imaging: No results found. No images are attached to the encounter.  Labs: Lab Results  Component Value Date   HGBA1C 6.4 (H) 03/23/2022   HGBA1C 5.9 (H) 11/14/2021     Lab Results  Component Value Date   ALBUMIN 4.1 06/05/2020    Lab Results  Component Value Date   MG 1.9 10/04/2019   No results found for: "VD25OH"  No results found for: "PREALBUMIN"    Latest Ref Rng & Units 03/23/2022   10:13 AM 11/14/2021   10:12 AM 10/18/2020   11:56 AM  CBC EXTENDED  WBC 3.8 - 10.8 Thousand/uL 4.1  5.1  6.5   RBC 3.80 - 5.10 Million/uL 3.86  4.70  4.32   Hemoglobin 11.7 - 15.5 g/dL 10.8  13.0  11.8   HCT  35.0 - 45.0 % 33.1  40.0  36.2   Platelets 140 - 400 Thousand/uL 189  190  235   NEUT# 1,500 - 7,800 cells/uL 2,333  2,769  4,193   Lymph# 850 - 3,900 cells/uL 1,394  1,826  1,671      There is no height or weight on file to calculate BMI.  Orders:  Orders Placed This Encounter  Procedures   XR Finger Middle Left   No orders of the defined types were placed in this encounter.    Procedures: No procedures performed  Clinical Data: No additional findings.  ROS:  All other systems negative, except as noted in the HPI. Review of Systems  Objective: Vital Signs: There were no vitals taken for this visit.  Specialty Comments:  No specialty comments available.  PMFS History: Patient Active Problem List   Diagnosis Date Noted   Arthritis    Hyperlipidemia    Hypertension    Persistent atrial fibrillation [facility])    Past Medical History:  Diagnosis Date   Arthritis    self report   Bone fracture    Right Foot   H/O hernia repair    History of hysterectomy  History of mammogram    Dr.Elizabeth Gwenlyn Saran   History of MRI    Dr.Elizabeth Gwenlyn Saran.   History of removal of skin mole 03/09/1997   Hyperlipidemia    Hypertension    Scalp hematoma    Wears glasses     Family History  Problem Relation Age of Onset   Breast cancer Mother    Alzheimer's disease Mother    Dementia Mother    Parkinson's disease Mother    Colon polyps Mother    Stroke Father    Dementia Father    Breast cancer Sister    Allergies Daughter    Allergies Daughter    Allergies Son    Colon cancer Neg Hx    Esophageal cancer Neg Hx    Stomach cancer Neg Hx    Rectal cancer Neg Hx     Past Surgical History:  Procedure Laterality Date   ABDOMINAL HYSTERECTOMY     BREAST BIOPSY  03/09/1997   Dr.Finder   BREAST BIOPSY Right 11/07/2019   FIBROADENOMA WITH CALCIFICATIONS   BREAST BIOPSY Left 11/07/2019   FIBROADENOMA WITH CALCIFICATIONS   CARDIOVERSION N/A 12/25/2019   Procedure:  CARDIOVERSION;  Surgeon: Adrian Prows, MD;  Location: [facility] ENDOSCOPY;  Service: Cardiovascular;  Laterality: N/A;   CARDIOVERSION N/A 02/06/2020   Procedure: CARDIOVERSION;  Surgeon: Adrian Prows, MD;  Location: [facility] ENDOSCOPY;  Service: Cardiovascular;  Laterality: N/A;   CHOLECYSTECTOMY     ~ 6 yrs ago   COLONOSCOPY  2009   Dr.Chesley   CYSTECTOMY  09/04/2019   Dr.Gray   GALLBLADDER SURGERY  03/10/2015   Dr.Amid ( [place] )   POLYPECTOMY     12 polyps removed at [place]  2016   Social History   Occupational History   Not on file  Tobacco Use   Smoking status: Never   Smokeless tobacco: Never  Vaping Use   Vaping Use: Never used  Substance and Sexual Activity   Alcohol use: Yes    Alcohol/week: 1.0 standard drink of alcohol    Types: 1 Glasses of wine per week    Comment: occasionally   Drug use: Never   Sexual activity: Not Currently

## 2022-04-20 NOTE — Patient Instructions (Signed)
Terry Koch , Thank you for taking time to come for your Medicare Wellness Visit. I appreciate your ongoing commitment to your health goals. Please review the following plan we discussed and let me know if I can assist you in the future.   Screening recommendations/referrals: Colonoscopy due in October  Mammogram aged out Bone Density order placed today Recommended yearly ophthalmology/optometry visit for glaucoma screening and checkup Recommended yearly dental visit for hygiene and checkup  Vaccinations: Influenza vaccine- due annually in September/October Pneumococcal vaccine recommended  Tdap vaccine recommended  Shingles vaccine recommended    Advanced directives: recommend to bring copy to place on file  Conditions/risks identified: advanced age, obesity  Next appointment: yearly    Preventive Care 65 Years and Older, Female Preventive care refers to lifestyle choices and visits with your health care provider that can promote health and wellness. What does preventive care include? A yearly physical exam. This is also called an annual well check. Dental exams once or twice a year. Routine eye exams. Ask your health care provider how often you should have your eyes checked. Personal lifestyle choices, including: Daily care of your teeth and gums. Regular physical activity. Eating a healthy diet. Avoiding tobacco and drug use. Limiting alcohol use. Practicing safe sex. Taking low-dose aspirin every day. Taking vitamin and mineral supplements as recommended by your health care provider. What happens during an annual well check? The services and screenings done by your health care provider during your annual well check will depend on your age, overall health, lifestyle risk factors, and family history of disease. Counseling  Your health care provider may ask you questions about your: Alcohol use. Tobacco use. Drug use. Emotional well-being. Home and relationship  well-being. Sexual activity. Eating habits. History of falls. Memory and ability to understand (cognition). Work and work Statistician. Reproductive health. Screening  You may have the following tests or measurements: Height, weight, and BMI. Blood pressure. Lipid and cholesterol levels. These may be checked every 5 years, or more frequently if you are over 86 years old. Skin check. Lung cancer screening. You may have this screening every year starting at age 3 if you have a 30-pack-year history of smoking and currently smoke or have quit within the past 15 years. Fecal occult blood test (FOBT) of the stool. You may have this test every year starting at age 84. Flexible sigmoidoscopy or colonoscopy. You may have a sigmoidoscopy every 5 years or a colonoscopy every 10 years starting at age 41. Hepatitis C blood test. Hepatitis B blood test. Sexually transmitted disease (STD) testing. Diabetes screening. This is done by checking your blood sugar (glucose) after you have not eaten for a while (fasting). You may have this done every 1-3 years. Bone density scan. This is done to screen for osteoporosis. You may have this done starting at age 35. Mammogram. This may be done every 1-2 years. Talk to your health care provider about how often you should have regular mammograms. Talk with your health care provider about your test results, treatment options, and if necessary, the need for more tests. Vaccines  Your health care provider may recommend certain vaccines, such as: Influenza vaccine. This is recommended every year. Tetanus, diphtheria, and acellular pertussis (Tdap, Td) vaccine. You may need a Td booster every 10 years. Zoster vaccine. You may need this after age 61. Pneumococcal 13-valent conjugate (PCV13) vaccine. One dose is recommended after age 58. Pneumococcal polysaccharide (PPSV23) vaccine. One dose is recommended after age 29. Talk to  your health care provider about which  screenings and vaccines you need and how often you need them. This information is not intended to replace advice given to you by your health care provider. Make sure you discuss any questions you have with your health care provider. Document Released: 03/22/2015 Document Revised: 11/13/2015 Document Reviewed: 12/25/2014 Elsevier Interactive Patient Education  2017 [place] Prevention in the Home Falls can cause injuries. They can happen to people of all ages. There are many things you can do to make your home safe and to help prevent falls. What can I do on the outside of my home? Regularly fix the edges of walkways and driveways and fix any cracks. Remove anything that might make you trip as you walk through a door, such as a raised step or threshold. Trim any bushes or trees on the path to your home. Use bright outdoor lighting. Clear any walking paths of anything that might make someone trip, such as rocks or tools. Regularly check to see if handrails are loose or broken. Make sure that both sides of any steps have handrails. Any raised decks and porches should have guardrails on the edges. Have any leaves, snow, or ice cleared regularly. Use sand or salt on walking paths during winter. Clean up any spills in your garage right away. This includes oil or grease spills. What can I do in the bathroom? Use night lights. Install grab bars by the toilet and in the tub and shower. Do not use towel bars as grab bars. Use non-skid mats or decals in the tub or shower. If you need to sit down in the shower, use a plastic, non-slip stool. Keep the floor dry. Clean up any water that spills on the floor as soon as it happens. Remove soap buildup in the tub or shower regularly. Attach bath mats securely with double-sided non-slip rug tape. Do not have throw rugs and other things on the floor that can make you trip. What can I do in the bedroom? Use night lights. Make sure that you have a  light by your bed that is easy to reach. Do not use any sheets or blankets that are too big for your bed. They should not hang down onto the floor. Have a firm chair that has side arms. You can use this for support while you get dressed. Do not have throw rugs and other things on the floor that can make you trip. What can I do in the kitchen? Clean up any spills right away. Avoid walking on wet floors. Keep items that you use a lot in easy-to-reach places. If you need to reach something above you, use a strong step stool that has a grab bar. Keep electrical cords out of the way. Do not use floor polish or wax that makes floors slippery. If you must use wax, use non-skid floor wax. Do not have throw rugs and other things on the floor that can make you trip. What can I do with my stairs? Do not leave any items on the stairs. Make sure that there are handrails on both sides of the stairs and use them. Fix handrails that are broken or loose. Make sure that handrails are as long as the stairways. Check any carpeting to make sure that it is firmly attached to the stairs. Fix any carpet that is loose or worn. Avoid having throw rugs at the top or bottom of the stairs. If you do have throw rugs, attach  them to the floor with carpet tape. Make sure that you have a light switch at the top of the stairs and the bottom of the stairs. If you do not have them, ask someone to add them for you. What else can I do to help prevent falls? Wear shoes that: Do not have high heels. Have rubber bottoms. Are comfortable and fit you well. Are closed at the toe. Do not wear sandals. If you use a stepladder: Make sure that it is fully opened. Do not climb a closed stepladder. Make sure that both sides of the stepladder are locked into place. Ask someone to hold it for you, if possible. Clearly mark and make sure that you can see: Any grab bars or handrails. First and last steps. Where the edge of each step  is. Use tools that help you move around (mobility aids) if they are needed. These include: Canes. Walkers. Scooters. Crutches. Turn on the lights when you go into a dark area. Replace any light bulbs as soon as they burn out. Set up your furniture so you have a clear path. Avoid moving your furniture around. If any of your floors are uneven, fix them. If there are any pets around you, be aware of where they are. Review your medicines with your doctor. Some medicines can make you feel dizzy. This can increase your chance of falling. Ask your doctor what other things that you can do to help prevent falls. This information is not intended to replace advice given to you by your health care provider. Make sure you discuss any questions you have with your health care provider. Document Released: 12/20/2008 Document Revised: 08/01/2015 Document Reviewed: 03/30/2014 Elsevier Interactive Patient Education  2017 [employer].

## 2022-04-20 NOTE — Progress Notes (Signed)
This service is provided via telemedicine  No vital signs collected/recorded due to the encounter was a telemedicine visit.   Location of patient (ex: home, work):  home  Patient consents to a telephone visit:  yes  Location of the provider (ex: office, home):  [facility]  Names of all persons participating in the telemedicine service and their role in the encounter:  patient, Earl Gala [facility], Lauree Chandler, NP, student: Minette Brine' Gwenlyn Found RN  Time spent on call:  12 minutes

## 2022-05-02 ENCOUNTER — Other Ambulatory Visit: Payer: Self-pay | Admitting: Cardiology

## 2022-05-02 DIAGNOSIS — I480 Paroxysmal atrial fibrillation: Secondary | ICD-10-CM

## 2022-05-05 ENCOUNTER — Other Ambulatory Visit: Payer: Self-pay

## 2022-05-05 DIAGNOSIS — I480 Paroxysmal atrial fibrillation: Secondary | ICD-10-CM

## 2022-05-05 MED ORDER — APIXABAN 5 MG PO TABS: 5.0000 mg | ORAL_TABLET | Freq: Two times a day (BID) | ORAL | 0 refills | Status: DC

## 2022-05-22 ENCOUNTER — Ambulatory Visit: Payer: Medicare Other | Admitting: Cardiology

## 2022-05-22 ENCOUNTER — Encounter: Payer: Self-pay | Admitting: Cardiology

## 2022-05-22 VITALS — BP 97/46 | HR 75 | Resp 14 | Ht 67.0 in | Wt 268.8 lb

## 2022-05-22 DIAGNOSIS — I10 Essential (primary) hypertension: Secondary | ICD-10-CM

## 2022-05-22 DIAGNOSIS — I4819 Other persistent atrial fibrillation: Secondary | ICD-10-CM

## 2022-05-22 DIAGNOSIS — I5032 Chronic diastolic (congestive) heart failure: Secondary | ICD-10-CM

## 2022-05-22 MED ORDER — AMIODARONE HCL 200 MG PO TABS: 200.0000 mg | ORAL_TABLET | Freq: Two times a day (BID) | ORAL | 3 refills | Status: DC

## 2022-05-22 NOTE — Progress Notes (Signed)
Primary Physician/Referring:  Lauree Chandler, NP  Patient ID: Terry Koch, female    DOB: [birth year], 77 y.o.   MRN: [id number]  Chief Complaint  Patient presents with   Atrial Fibrillation   Hypertension   Shortness of Breath   Follow-up    6 month   HPI:    Terry Koch  is a 77 y.o. African-American female with hypertension, hyperlipidemia, obesity, colonic polyps requiring serial surveillance colonoscopy, no prior history of GI bleed underwent cardioversion 12/25/2019 was unsuccessful. Underwent repeat cardioversion with Amiodarone on board successfully to normal sinus rhythm 02/06/2020.    This is a 77-month office visit.  Patient started on open symptoms of dyspnea and also fatigue that started about a month ago.  No leg edema, PND or orthopnea.  No chest pain or palpitations.  Past Medical History:  Diagnosis Date   Arthritis    self report   Bone fracture    Right Foot   H/O hernia repair    History of hysterectomy    History of mammogram    Dr.Elizabeth Gwenlyn Saran   History of MRI    Dr.Elizabeth Gwenlyn Saran.   History of removal of skin mole 03/09/1997   Hyperlipidemia    Hypertension    Scalp hematoma    Wears glasses      Social History   Tobacco Use   Smoking status: Never   Smokeless tobacco: Never  Substance Use Topics   Alcohol use: Yes    Alcohol/week: 1.0 standard drink of alcohol    Types: 1 Glasses of wine per week    Comment: occasionally   Marital Status: Married  ROS  Review of Systems  Constitutional: Positive for malaise/fatigue.  Cardiovascular:  Positive for dyspnea on exertion. Negative for chest pain and leg swelling.   Objective  Blood pressure (!) 97/46, pulse 75, resp. rate 14, height 5\' 7"  (1.702 m), weight 268 lb 12.8 oz (121.9 kg), SpO2 98 %.     05/22/2022    1:55 PM 05/22/2022    1:51 PM 04/20/2022    1:39 PM  Vitals with BMI  Height  5\' 7"  5\' 7"   Weight  268 lbs 13 oz 270 lbs  BMI  [id number]  Systolic 97  [id number]   Diastolic 46 46   Pulse 75 91      Physical Exam Constitutional:      Appearance: She is obese.  Neck:     Vascular: No carotid bruit or JVD.  Cardiovascular:     Rate and Rhythm: Normal rate. Rhythm irregularly irregular.     Pulses: Intact distal pulses.     Heart sounds: Normal heart sounds. No murmur heard.    No gallop.  Pulmonary:     Effort: Pulmonary effort is normal.     Breath sounds: Normal breath sounds.  Abdominal:     General: Bowel sounds are normal.     Palpations: Abdomen is soft.  Musculoskeletal:     Right lower leg: No edema.     Left lower leg: No edema.    Laboratory examination:   Lab Results  Component Value Date   NA 141 03/23/2022   K 4.0 03/23/2022   CO2 29 03/23/2022   GLUCOSE 98 03/23/2022   BUN 14 03/23/2022   CREATININE 0.97 03/23/2022   CALCIUM 9.7 03/23/2022   EGFR 61 03/23/2022   GFRNONAA 57 (L) 12/07/2019  She is presently stable however hemoglobin is trended down.     Latest  Ref Rng & Units 03/23/2022   10:13 AM 11/14/2021   10:12 AM 10/18/2020   11:56 AM  CMP  Glucose 65 - 139 mg/dL 98  108  105   BUN 7 - 25 mg/dL 14  12  14    Creatinine 0.60 - 1.00 mg/dL 0.97  1.05  0.85   Sodium 135 - 146 mmol/L 141  140  140   Potassium 3.5 - 5.3 mmol/L 4.0  3.8  3.9   Chloride 98 - 110 mmol/L 106  105  104   CO2 20 - 32 mmol/L 29  27  28    Calcium 8.6 - 10.4 mg/dL 9.7  9.3  9.3   Total Protein 6.1 - 8.1 g/dL 6.6  6.7  6.8   Total Bilirubin 0.2 - 1.2 mg/dL 0.4  0.4  0.4   AST 10 - 35 U/L 22  21  26    ALT 6 - 29 U/L 18  20  23        Latest Ref Rng & Units 03/23/2022   10:13 AM 11/14/2021   10:12 AM 10/18/2020   11:56 AM  CBC  WBC 3.8 - 10.8 Thousand/uL 4.1  5.1  6.5   Hemoglobin 11.7 - 15.5 g/dL 10.8  13.0  11.8   Hematocrit 35.0 - 45.0 % 33.1  40.0  36.2   Platelets 140 - 400 Thousand/uL 189  190  235     Lipid Panel Recent Labs    11/14/21 1012 03/23/22 1013  CHOL 191 145  TRIG 126 129  LDLCALC 115* 79  HDL 52 43*   CHOLHDL 3.7 3.4   Lab Results  Component Value Date   TSH 0.95 10/04/2019    Last vitamin D No results found for: "25OHVITD2", "25OHVITD3", "VD25OH"   Medications and allergies   Allergies  Allergen Reactions   Amlodipine Other (See Comments)    Leg edema     Medications after today's encounter:  Current Outpatient Medications:    acetaminophen (TYLENOL) 650 MG CR tablet, Take 1,300 mg by mouth in the morning and at bedtime., Disp: , Rfl:    apixaban (ELIQUIS) 5 MG TABS tablet, Take 1 tablet (5 mg total) by mouth 2 (two) times daily., Disp: 60 tablet, Rfl: 0   Ascorbic Acid (VITAMIN C) 1000 MG tablet, Take 1,000 mg by mouth daily., Disp: , Rfl:    Cholecalciferol (VITAMIN D3) 25 MCG (1000 UT) CAPS, Take 1,000 Units by mouth daily. , Disp: , Rfl:    metoprolol succinate (TOPROL-XL) 50 MG 24 hr tablet, TAKE 1 TABLET(50 MG) BY MOUTH TWICE DAILY WITH OR IMMEDIATELY FOLLOWING A MEAL, Disp: 180 tablet, Rfl: 1   Misc Natural Products (ELDERBERRY IMMUNE COMPLEX) CHEW, Chew 50 mg by mouth daily., Disp: , Rfl:    Multiple Vitamins-Minerals (COMPLETE MULTIVITAMIN/MINERAL PO), Take 1 tablet by mouth daily. [employer] Vitamin, Disp: , Rfl:    NON FORMULARY, Apply 1 application topically at bedtime. Veterinary liniment gel, Disp: , Rfl:    NON FORMULARY, Apply 1 application topically as needed. Body balm with magnesium 12mg , Disp: , Rfl:    OMEGA-3 KRILL OIL PO, Take 350 mg by mouth daily. , Disp: , Rfl:    Psyllium (METAMUCIL FIBER PO), Take 10 mLs by mouth 3 (three) times a week. With water or Juice, Disp: , Rfl:    rosuvastatin (CRESTOR) 10 MG tablet, Take 1 tablet (10 mg total) by mouth daily., Disp: 90 tablet, Rfl: 3   spironolactone (ALDACTONE) 25 MG tablet, Take  1 tablet (25 mg total) by mouth every morning., Disp: 90 tablet, Rfl: 3   valsartan-hydrochlorothiazide (DIOVAN-HCT) 320-25 MG tablet, TAKE 1 TABLET BY MOUTH EVERY MORNING, Disp: 90 tablet, Rfl: 3   vitamin B-12  (CYANOCOBALAMIN) 1000 MCG tablet, Take 1,000 mcg by mouth daily., Disp: , Rfl:    Zinc 50 MG TABS, Take 1 tablet by mouth daily., Disp: , Rfl:    amiodarone (PACERONE) 200 MG tablet, Take 1 tablet (200 mg total) by mouth 2 (two) times daily., Disp: 90 tablet, Rfl: 3    Radiology:   No results found.  Cardiac Studies:   Exercise Myoview stress test 10/18/2019: Exercise nuclear stress test was performed using Bruce protocol. Patient reached 5.1 METS, and 98% of age predicted maximum heart rate. Exercise capacity was low. No chest pain reported. Heart rate and hemodynamic response were normal. Stress EKG revealed no ischemic changes. Normal myocardial perfusion. Stress LVEF calculated 49%, although visually appears normal. Low risk study.  Echocardiogram 10/25/2019:  Left ventricle cavity is normal in size. Mild concentric hypertrophy of  the left ventricle. Normal global wall motion. Normal LV systolic function with EF 55%. Doppler evidence of grade II (pseudonormal) diastolic dysfunction.  Left atrial cavity is severely dilated. Integrity of interatrial septum could not be adequately assessed to limited visualization. Consider  alternate imaging study, if clinically indicated.  Trileaflet aortic valve.  Trace aortic regurgitation.  Mild tricuspid regurgitation. No evidence of pulmonary hypertension.  Cardioversion 12/25/2019:  Indication symptomatic A. Fibrillation. Procedure: Using 90 mg of IV Propofol and 100 IV Lidocaine (for reducing venous pain) for achieving deep sedation, synchronized direct current cardioversion performed. Patient was delivered with 150 x 1 then 200 Joules of electricity X 3 without success. Patient tolerated the procedure well. No immediate complication noted.    Direct current cardioversion 02/06/20 : Indication symptomatic A. Fibrillation. Procedure: Using 100 mg of IV Propofol and 40 IV Lidocaine (for reducing venous pain) for achieving deep sedation, synchronized  direct current cardioversion performed. Patient was delivered with 200J x 1 then 350 Joules of electricity X 1 with success to NSR. Patient tolerated the procedure well. No immediate complication noted.    EKG:   EKG 05/22/2022: Atrial fibrillation with controlled ventricular response at rate of 81 bpm, left axis deviation, left anterior fascicular block.  Poor R progression, cannot exclude anteroseptal infarct old.  LVH.  Nonspecific T abnormality.  Compared to 11/20/2021, sinus rhythm has been replaced.  EKG 01/02/2020: Atrial fibrillation with controlled ventricular response at the rate of 74 bpm, leftward axis, poor R wave progression, cannot exclude anteroseptal infarct old.  Nonspecific T abnormality.  No significant change from 11/23/2019.  Assessment     ICD-10-CM   1. Persistent atrial fibrillation (HCC)  I48.19 EKG [id number]    Basic metabolic panel    Magnesium    amiodarone (PACERONE) 200 MG tablet    2. Primary hypertension  I10     3. Chronic diastolic heart failure (HCC)  I50.32 Brain natriuretic peptide       CHA2DS2-VASc Score is 4.  Yearly risk of stroke: 4.8% (A, F, HTN).   Medications Discontinued During This Encounter  Medication Reason   amiodarone (PACERONE) 200 MG tablet Reorder    Meds ordered this encounter  Medications   amiodarone (PACERONE) 200 MG tablet    Sig: Take 1 tablet (200 mg total) by mouth 2 (two) times daily.    Dispense:  90 tablet    Refill:  3   Recommendations:  Terry Koch is a 77 y.o. African-American female with hypertension, hyperlipidemia, obesity, colonic polyps requiring serial surveillance colonoscopy, no prior history of GI bleed underwent cardioversion 12/25/2019 was unsuccessful. Underwent repeat cardioversion with Amiodarone on board successfully to normal sinus rhythm 02/06/2020.    1. Persistent atrial fibrillation ([place]) Patient started on open symptoms of dyspnea and also fatigue that started about a month ago.   She is back in atrial fibrillation.  I will increase the dose of the amiodarone from 200 mg 1/2 tablet twice daily to 200 mg twice daily for now, we will reverse her back to 200 mg once a day once cardioversion is performed.  Labs ordered for today.  She has lost about 15 to 16 pounds in weight, encouraged her to continue to lose weight.  2. Primary hypertension Blood pressure under excellent control.  I reviewed her external labs, I will recheck CBC and she needs BMP prior to cardioversion.  New onset anemia.  She is presently on anticoagulants.  No obvious bleeding diathesis.  3. Chronic diastolic heart failure ([place]) She is probably having acute diastolic heart failure as well in view of recurrence of atrial fibrillation,  clinically appears stable, does not need hospitalization.  No need for diuretics, suspect atrial fibrillation once back to sinus rhythm symptoms should improve.  She has been scheduled for cardioversion next week.. Schedule for Direct current cardioversion. I have discussed regarding risks benefits rate control vs rhythm control with the patient. Patient understands cardiac arrest and need for CPR, aspiration pneumonia, but not limited to these. Patient is willing.     Adrian Prows, MD, [facility] 05/22/2022, 3:53 PM Office: [phone] Fax: [phone] Pager: [phone]

## 2022-05-22 NOTE — H&P (View-Only) (Signed)
 Primary Physician/Referring:  Eubanks, Jessica K, NP  Patient ID: Terry Koch, female    DOB: 09/01/1945, 77 y.o.   MRN: [id number]  Chief Complaint  Patient presents with   Atrial Fibrillation   Hypertension   Shortness of Breath   Follow-up    6 month   HPI:    Terry Koch  is a 77 y.o. African-American female with hypertension, hyperlipidemia, obesity, colonic polyps requiring serial surveillance colonoscopy, no prior history of GI bleed underwent cardioversion 12/25/2019 was unsuccessful. Underwent repeat cardioversion with Amiodarone on board successfully to normal sinus rhythm 02/06/2020.    This is a 6-month office visit.  Patient started on open symptoms of dyspnea and also fatigue that started about a month ago.  No leg edema, PND or orthopnea.  No chest pain or palpitations.  Past Medical History:  Diagnosis Date   Arthritis    self report   Bone fracture    Right Foot   H/O hernia repair    History of hysterectomy    History of mammogram    Dr.Elizabeth Kane   History of MRI    Dr.Elizabeth Kane.   History of removal of skin mole 03/09/1997   Hyperlipidemia    Hypertension    Scalp hematoma    Wears glasses      Social History   Tobacco Use   Smoking status: Never   Smokeless tobacco: Never  Substance Use Topics   Alcohol use: Yes    Alcohol/week: 1.0 standard drink of alcohol    Types: 1 Glasses of wine per week    Comment: occasionally   Marital Status: Married  ROS  Review of Systems  Constitutional: Positive for malaise/fatigue.  Cardiovascular:  Positive for dyspnea on exertion. Negative for chest pain and leg swelling.   Objective  Blood pressure (!) 97/46, pulse 75, resp. rate 14, height 5' 7" (1.702 m), weight 268 lb 12.8 oz (121.9 kg), SpO2 98 %.     05/22/2022    1:55 PM 05/22/2022    1:51 PM 04/20/2022    1:39 PM  Vitals with BMI  Height  5' 7" 5' 7"  Weight  268 lbs 13 oz 270 lbs  BMI  42.09 42.28  Systolic 97  100   Diastolic 46 46   Pulse 75 91      Physical Exam Constitutional:      Appearance: She is obese.  Neck:     Vascular: No carotid bruit or JVD.  Cardiovascular:     Rate and Rhythm: Normal rate. Rhythm irregularly irregular.     Pulses: Intact distal pulses.     Heart sounds: Normal heart sounds. No murmur heard.    No gallop.  Pulmonary:     Effort: Pulmonary effort is normal.     Breath sounds: Normal breath sounds.  Abdominal:     General: Bowel sounds are normal.     Palpations: Abdomen is soft.  Musculoskeletal:     Right lower leg: No edema.     Left lower leg: No edema.    Laboratory examination:   Lab Results  Component Value Date   NA 141 03/23/2022   K 4.0 03/23/2022   CO2 29 03/23/2022   GLUCOSE 98 03/23/2022   BUN 14 03/23/2022   CREATININE 0.97 03/23/2022   CALCIUM 9.7 03/23/2022   EGFR 61 03/23/2022   GFRNONAA 57 (L) 12/07/2019  She is presently stable however hemoglobin is trended down.     Latest   Ref Rng & Units 03/23/2022   10:13 AM 11/14/2021   10:12 AM 10/18/2020   11:56 AM  CMP  Glucose 65 - 139 mg/dL 98  108  105   BUN 7 - 25 mg/dL 14  12  14   Creatinine 0.60 - 1.00 mg/dL 0.97  1.05  0.85   Sodium 135 - 146 mmol/L 141  140  140   Potassium 3.5 - 5.3 mmol/L 4.0  3.8  3.9   Chloride 98 - 110 mmol/L 106  105  104   CO2 20 - 32 mmol/L 29  27  28   Calcium 8.6 - 10.4 mg/dL 9.7  9.3  9.3   Total Protein 6.1 - 8.1 g/dL 6.6  6.7  6.8   Total Bilirubin 0.2 - 1.2 mg/dL 0.4  0.4  0.4   AST 10 - 35 U/L 22  21  26   ALT 6 - 29 U/L 18  20  23       Latest Ref Rng & Units 03/23/2022   10:13 AM 11/14/2021   10:12 AM 10/18/2020   11:56 AM  CBC  WBC 3.8 - 10.8 Thousand/uL 4.1  5.1  6.5   Hemoglobin 11.7 - 15.5 g/dL 10.8  13.0  11.8   Hematocrit 35.0 - 45.0 % 33.1  40.0  36.2   Platelets 140 - 400 Thousand/uL 189  190  235     Lipid Panel Recent Labs    11/14/21 1012 03/23/22 1013  CHOL 191 145  TRIG 126 129  LDLCALC 115* 79  HDL 52 43*   CHOLHDL 3.7 3.4   Lab Results  Component Value Date   TSH 0.95 10/04/2019    Last vitamin D No results found for: "25OHVITD2", "25OHVITD3", "VD25OH"   Medications and allergies   Allergies  Allergen Reactions   Amlodipine Other (See Comments)    Leg edema     Medications after today's encounter:  Current Outpatient Medications:    acetaminophen (TYLENOL) 650 MG CR tablet, Take 1,300 mg by mouth in the morning and at bedtime., Disp: , Rfl:    apixaban (ELIQUIS) 5 MG TABS tablet, Take 1 tablet (5 mg total) by mouth 2 (two) times daily., Disp: 60 tablet, Rfl: 0   Ascorbic Acid (VITAMIN C) 1000 MG tablet, Take 1,000 mg by mouth daily., Disp: , Rfl:    Cholecalciferol (VITAMIN D3) 25 MCG (1000 UT) CAPS, Take 1,000 Units by mouth daily. , Disp: , Rfl:    metoprolol succinate (TOPROL-XL) 50 MG 24 hr tablet, TAKE 1 TABLET(50 MG) BY MOUTH TWICE DAILY WITH OR IMMEDIATELY FOLLOWING A MEAL, Disp: 180 tablet, Rfl: 1   Misc Natural Products (ELDERBERRY IMMUNE COMPLEX) CHEW, Chew 50 mg by mouth daily., Disp: , Rfl:    Multiple Vitamins-Minerals (COMPLETE MULTIVITAMIN/MINERAL PO), Take 1 tablet by mouth daily. [employer] Vitamin, Disp: , Rfl:    NON FORMULARY, Apply 1 application topically at bedtime. Veterinary liniment gel, Disp: , Rfl:    NON FORMULARY, Apply 1 application topically as needed. Body balm with magnesium 12mg, Disp: , Rfl:    OMEGA-3 KRILL OIL PO, Take 350 mg by mouth daily. , Disp: , Rfl:    Psyllium (METAMUCIL FIBER PO), Take 10 mLs by mouth 3 (three) times a week. With water or Juice, Disp: , Rfl:    rosuvastatin (CRESTOR) 10 MG tablet, Take 1 tablet (10 mg total) by mouth daily., Disp: 90 tablet, Rfl: 3   spironolactone (ALDACTONE) 25 MG tablet, Take   1 tablet (25 mg total) by mouth every morning., Disp: 90 tablet, Rfl: 3   valsartan-hydrochlorothiazide (DIOVAN-HCT) 320-25 MG tablet, TAKE 1 TABLET BY MOUTH EVERY MORNING, Disp: 90 tablet, Rfl: 3   vitamin B-12  (CYANOCOBALAMIN) 1000 MCG tablet, Take 1,000 mcg by mouth daily., Disp: , Rfl:    Zinc 50 MG TABS, Take 1 tablet by mouth daily., Disp: , Rfl:    amiodarone (PACERONE) 200 MG tablet, Take 1 tablet (200 mg total) by mouth 2 (two) times daily., Disp: 90 tablet, Rfl: 3    Radiology:   No results found.  Cardiac Studies:   Exercise Myoview stress test 10/18/2019: Exercise nuclear stress test was performed using Bruce protocol. Patient reached 5.1 METS, and 98% of age predicted maximum heart rate. Exercise capacity was low. No chest pain reported. Heart rate and hemodynamic response were normal. Stress EKG revealed no ischemic changes. Normal myocardial perfusion. Stress LVEF calculated 49%, although visually appears normal. Low risk study.  Echocardiogram 10/25/2019:  Left ventricle cavity is normal in size. Mild concentric hypertrophy of  the left ventricle. Normal global wall motion. Normal LV systolic function with EF 55%. Doppler evidence of grade II (pseudonormal) diastolic dysfunction.  Left atrial cavity is severely dilated. Integrity of interatrial septum could not be adequately assessed to limited visualization. Consider  alternate imaging study, if clinically indicated.  Trileaflet aortic valve.  Trace aortic regurgitation.  Mild tricuspid regurgitation. No evidence of pulmonary hypertension.  Cardioversion 12/25/2019:  Indication symptomatic A. Fibrillation. Procedure: Using 90 mg of IV Propofol and 100 IV Lidocaine (for reducing venous pain) for achieving deep sedation, synchronized direct current cardioversion performed. Patient was delivered with 150 x 1 then 200 Joules of electricity X 3 without success. Patient tolerated the procedure well. No immediate complication noted.    Direct current cardioversion 02/06/20 : Indication symptomatic A. Fibrillation. Procedure: Using 100 mg of IV Propofol and 40 IV Lidocaine (for reducing venous pain) for achieving deep sedation, synchronized  direct current cardioversion performed. Patient was delivered with 200J x 1 then 350 Joules of electricity X 1 with success to NSR. Patient tolerated the procedure well. No immediate complication noted.    EKG:   EKG 05/22/2022: Atrial fibrillation with controlled ventricular response at rate of 81 bpm, left axis deviation, left anterior fascicular block.  Poor R progression, cannot exclude anteroseptal infarct old.  LVH.  Nonspecific T abnormality.  Compared to 11/20/2021, sinus rhythm has been replaced.  EKG 01/02/2020: Atrial fibrillation with controlled ventricular response at the rate of 74 bpm, leftward axis, poor R wave progression, cannot exclude anteroseptal infarct old.  Nonspecific T abnormality.  No significant change from 11/23/2019.  Assessment     ICD-10-CM   1. Persistent atrial fibrillation (HCC)  I48.19 EKG 12-Lead    Basic metabolic panel    Magnesium    amiodarone (PACERONE) 200 MG tablet    2. Primary hypertension  I10     3. Chronic diastolic heart failure (HCC)  I50.32 Brain natriuretic peptide       CHA2DS2-VASc Score is 4.  Yearly risk of stroke: 4.8% (A, F, HTN).   Medications Discontinued During This Encounter  Medication Reason   amiodarone (PACERONE) 200 MG tablet Reorder    Meds ordered this encounter  Medications   amiodarone (PACERONE) 200 MG tablet    Sig: Take 1 tablet (200 mg total) by mouth 2 (two) times daily.    Dispense:  90 tablet    Refill:  3   Recommendations:     Terry Koch is a 77 y.o. African-American female with hypertension, hyperlipidemia, obesity, colonic polyps requiring serial surveillance colonoscopy, no prior history of GI bleed underwent cardioversion 12/25/2019 was unsuccessful. Underwent repeat cardioversion with Amiodarone on board successfully to normal sinus rhythm 02/06/2020.    1. Persistent atrial fibrillation (HCC) Patient started on open symptoms of dyspnea and also fatigue that started about a month ago.   She is back in atrial fibrillation.  I will increase the dose of the amiodarone from 200 mg 1/2 tablet twice daily to 200 mg twice daily for now, we will reverse her back to 200 mg once a day once cardioversion is performed.  Labs ordered for today.  She has lost about 15 to 16 pounds in weight, encouraged her to continue to lose weight.  2. Primary hypertension Blood pressure under excellent control.  I reviewed her external labs, I will recheck CBC and she needs BMP prior to cardioversion.  New onset anemia.  She is presently on anticoagulants.  No obvious bleeding diathesis.  3. Chronic diastolic heart failure (HCC) She is probably having acute diastolic heart failure as well in view of recurrence of atrial fibrillation,  clinically appears stable, does not need hospitalization.  No need for diuretics, suspect atrial fibrillation once back to sinus rhythm symptoms should improve.  She has been scheduled for cardioversion next week.. Schedule for Direct current cardioversion. I have discussed regarding risks benefits rate control vs rhythm control with the patient. Patient understands cardiac arrest and need for CPR, aspiration pneumonia, but not limited to these. Patient is willing.     Olivette Beckmann, MD, FACC 05/22/2022, 3:53 PM Office: [phone] Fax: [phone] Pager: [phone]  

## 2022-05-23 LAB — BASIC METABOLIC PANEL
BUN/Creatinine Ratio: 14 (ref 12–28)
BUN: 17 mg/dL (ref 8–27)
CO2: 21 mmol/L (ref 20–29)
Calcium: 9.6 mg/dL (ref 8.7–10.3)
Chloride: 104 mmol/L (ref 96–106)
Creatinine, Ser: 1.23 mg/dL — ABNORMAL HIGH (ref 0.57–1.00)
Glucose: 91 mg/dL (ref 70–99)
Potassium: 4.3 mmol/L (ref 3.5–5.2)
Sodium: 142 mmol/L (ref 134–144)
eGFR: 45 mL/min/{1.73_m2} — ABNORMAL LOW (ref >59)

## 2022-05-23 LAB — MAGNESIUM: Magnesium: 2 mg/dL (ref 1.6–2.3)

## 2022-05-23 LAB — BRAIN NATRIURETIC PEPTIDE: BNP: 181.8 pg/mL — ABNORMAL HIGH (ref 0.0–100.0)

## 2022-05-29 ENCOUNTER — Other Ambulatory Visit: Payer: Self-pay | Admitting: Cardiology

## 2022-05-29 DIAGNOSIS — I480 Paroxysmal atrial fibrillation: Secondary | ICD-10-CM

## 2022-06-10 ENCOUNTER — Ambulatory Visit (HOSPITAL_BASED_OUTPATIENT_CLINIC_OR_DEPARTMENT_OTHER): Payer: Medicare Other | Admitting: Certified Registered Nurse Anesthetist

## 2022-06-10 ENCOUNTER — Ambulatory Visit (HOSPITAL_COMMUNITY): Payer: Medicare Other | Admitting: Certified Registered Nurse Anesthetist

## 2022-06-10 ENCOUNTER — Encounter (HOSPITAL_COMMUNITY)
Admission: RE | Disposition: A | Discharge status: Home / Self Care | Payer: Self-pay | Source: Home / Self Care | Attending: Cardiology

## 2022-06-10 ENCOUNTER — Other Ambulatory Visit: Payer: Self-pay

## 2022-06-10 ENCOUNTER — Encounter (HOSPITAL_COMMUNITY): Payer: Self-pay | Admitting: Cardiology

## 2022-06-10 ENCOUNTER — Ambulatory Visit (HOSPITAL_COMMUNITY)
Admission: RE | Admit: 2022-06-10 | Discharge: 2022-06-10 | Disposition: A | Discharge status: Home / Self Care | Payer: Medicare Other | Attending: Cardiology | Admitting: Cardiology

## 2022-06-10 DIAGNOSIS — I110 Hypertensive heart disease with heart failure: Secondary | ICD-10-CM | POA: Diagnosis not present

## 2022-06-10 DIAGNOSIS — I10 Essential (primary) hypertension: Secondary | ICD-10-CM | POA: Diagnosis not present

## 2022-06-10 DIAGNOSIS — E669 Obesity, unspecified: Secondary | ICD-10-CM | POA: Insufficient documentation

## 2022-06-10 DIAGNOSIS — Z09 Encounter for follow-up examination after completed treatment for conditions other than malignant neoplasm: Secondary | ICD-10-CM | POA: Insufficient documentation

## 2022-06-10 DIAGNOSIS — Z79899 Other long term (current) drug therapy: Secondary | ICD-10-CM | POA: Diagnosis not present

## 2022-06-10 DIAGNOSIS — I5032 Chronic diastolic (congestive) heart failure: Secondary | ICD-10-CM | POA: Insufficient documentation

## 2022-06-10 DIAGNOSIS — Z6841 Body Mass Index (BMI) 40.0 and over, adult: Secondary | ICD-10-CM

## 2022-06-10 DIAGNOSIS — M1990 Unspecified osteoarthritis, unspecified site: Secondary | ICD-10-CM

## 2022-06-10 DIAGNOSIS — I4891 Unspecified atrial fibrillation: Secondary | ICD-10-CM | POA: Diagnosis present

## 2022-06-10 DIAGNOSIS — E785 Hyperlipidemia, unspecified: Secondary | ICD-10-CM | POA: Diagnosis not present

## 2022-06-10 DIAGNOSIS — I4819 Other persistent atrial fibrillation: Secondary | ICD-10-CM | POA: Diagnosis not present

## 2022-06-10 DIAGNOSIS — Z86010 Personal history of colonic polyps: Secondary | ICD-10-CM | POA: Diagnosis not present

## 2022-06-10 HISTORY — PX: CARDIOVERSION: SHX1299

## 2022-06-10 LAB — POCT I-STAT, CHEM 8
BUN: 16 mg/dL (ref 8–23)
Calcium, Ion: 1.14 mmol/L — ABNORMAL LOW (ref 1.15–1.40)
Chloride: 104 mmol/L (ref 98–111)
Creatinine, Ser: 1 mg/dL (ref 0.44–1.00)
Glucose, Bld: 103 mg/dL — ABNORMAL HIGH (ref 70–99)
HCT: 36 % (ref 36.0–46.0)
Hemoglobin: 12.2 g/dL (ref 12.0–15.0)
Potassium: 3.9 mmol/L (ref 3.5–5.1)
Sodium: 140 mmol/L (ref 135–145)
TCO2: 28 mmol/L (ref 22–32)

## 2022-06-10 SURGERY — CARDIOVERSION
Anesthesia: General

## 2022-06-10 MED ORDER — AMIODARONE HCL 200 MG PO TABS
200.0000 mg | ORAL_TABLET | Freq: Every day | ORAL | 3 refills | Status: DC
Start: 2022-06-10 — End: 2022-10-19

## 2022-06-10 MED ORDER — LIDOCAINE 2% (20 MG/ML) 5 ML SYRINGE
INTRAMUSCULAR | Status: DC | PRN
  Administered 2022-06-10: 60 mg via INTRAVENOUS

## 2022-06-10 MED ORDER — APIXABAN 5 MG PO TABS
5.0000 mg | ORAL_TABLET | Freq: Once | ORAL | Status: DC
  Filled 2022-06-10: qty 1

## 2022-06-10 MED ORDER — SODIUM CHLORIDE 0.9 % IV SOLN: INTRAVENOUS | Status: DC

## 2022-06-10 MED ORDER — METOPROLOL SUCCINATE ER 50 MG PO TB24: 50.0000 mg | ORAL_TABLET | Freq: Every day | ORAL | 0 refills | Status: DC

## 2022-06-10 MED ORDER — PROPOFOL 10 MG/ML IV BOLUS
INTRAVENOUS | Status: DC | PRN
  Administered 2022-06-10: 80 mg via INTRAVENOUS

## 2022-06-10 NOTE — CV Procedure (Signed)
Direct current cardioversion:  Indication symptomatic: Symptomatic atrial fibrillation  Procedure Details:  Consent: Risks of procedure as well as the alternatives and risks of each were explained to the (patient/caregiver).  Consent for procedure obtained.  Time Out: Verified patient identification, verified procedure, site/side was marked, verified correct patient position, special equipment/implants available, medications/allergies/relevent history reviewed, required imaging and test results available. PERFORMED.  Patient placed on cardiac monitor, pulse oximetry, supplemental oxygen as necessary.  Sedation given: see anesthesia records Pacer pads placed anterior and posterior chest.  Cardioverted 2 time(s).  Cardioversion with synchronized biphasic 200J shock.  Evaluation: Findings: Post procedure EKG shows: NSR Complications: None Patient did tolerate procedure well. Updated husband.  Rex Kras, [place], [facility]  Pager:  [phone] Office: [phone]

## 2022-06-10 NOTE — Anesthesia Procedure Notes (Signed)
Procedure Name: General with mask airway Date/Time: 06/10/2022 10:53 AM  Performed by: Everlean Cherry A, CRNAPre-anesthesia Checklist: Patient identified, Emergency Drugs available, Suction available and Patient being monitored Patient Re-evaluated:Patient Re-evaluated prior to induction Oxygen Delivery Method: Ambu bag Preoxygenation: Pre-oxygenation with 100% oxygen Induction Type: IV induction Ventilation: Mask ventilation without difficulty Placement Confirmation: positive ETCO2 Dental Injury: Teeth and Oropharynx as per pre-operative assessment

## 2022-06-10 NOTE — Anesthesia Postprocedure Evaluation (Signed)
Anesthesia Post Note  Patient: Terry Koch  Procedure(s) Performed: CARDIOVERSION     Patient location during evaluation: PACU Anesthesia Type: General Level of consciousness: awake and alert Pain management: pain level controlled Vital Signs Assessment: post-procedure vital signs reviewed and stable Respiratory status: spontaneous breathing, nonlabored ventilation, respiratory function stable and patient connected to nasal cannula oxygen Cardiovascular status: blood pressure returned to baseline and stable Postop Assessment: no apparent nausea or vomiting Anesthetic complications: no   No notable events documented.  Last Vitals:  Vitals:   06/10/22 1130 06/10/22 1140  BP: (!) 120/59 138/87  Pulse: (!) 48 (!) 49  Resp: 17 15  Temp:    SpO2: 92% 94%    Last Pain:  Vitals:   06/10/22 1140  TempSrc:   PainSc: 0-No pain                 Belenda Cruise P Jceon Alverio

## 2022-06-10 NOTE — Interval H&P Note (Signed)
History and Physical Interval Note:  06/10/2022 10:41 AM  Terry Koch  has presented today for surgery, with the diagnosis of afib.  The various methods of treatment have been discussed with the patient and family. After consideration of risks, benefits and other options for treatment, the patient has consented to  Procedure(s): CARDIOVERSION (N/A) as a surgical intervention.  The patient's history has been reviewed, patient examined, no change in status, stable for surgery.  I have reviewed the patient's chart and labs.  Questions were answered to the patient's satisfaction.       Latest Ref Rng & Units 06/10/2022   10:15 AM 05/22/2022    3:10 PM 03/23/2022   10:13 AM  BMP  Glucose 70 - 99 mg/dL 103  91  98   BUN 8 - 23 mg/dL 16  17  14    Creatinine 0.44 - 1.00 mg/dL 1.00  1.23  0.97   BUN/Creat Ratio 12 - 28  14  SEE NOTE:   Sodium 135 - 145 mmol/L 140  142  141   Potassium 3.5 - 5.1 mmol/L 3.9  4.3  4.0   Chloride 98 - 111 mmol/L 104  104  106   CO2 20 - 29 mmol/L  21  29   Calcium 8.7 - 10.3 mg/dL  9.6  9.7    Will give the morning dose of Eliquis prior to the procedure.   Zadrian Mccauley [place], DO, [facility]

## 2022-06-10 NOTE — Transfer of Care (Signed)
Immediate Anesthesia Transfer of Care Note  Patient: Terry Koch  Procedure(s) Performed: CARDIOVERSION  Patient Location: Endoscopy Unit  Anesthesia Type:General  Level of Consciousness: drowsy  Airway & Oxygen Therapy: Patient Spontanous Breathing  Post-op Assessment: Report given to RN and Post -op Vital signs reviewed and stable  Post vital signs: Reviewed and stable  Last Vitals:  Vitals Value Taken Time  BP 134/74 1100 06/10/22  Temp    Pulse 69 1100 06/10/22  Resp 16 1100 06/10/22  SpO2 95 1100 06/10/22    Last Pain:  Vitals:   06/10/22 1000  TempSrc: Tympanic  PainSc: 0-No pain         Complications: No notable events documented.

## 2022-06-10 NOTE — Discharge Instructions (Signed)

## 2022-06-10 NOTE — Anesthesia Preprocedure Evaluation (Signed)
Anesthesia Evaluation  Patient identified by MRN, date of birth, ID band Patient awake    Reviewed: Allergy & Precautions, NPO status , Patient's Chart, lab work & pertinent test results  Airway Mallampati: III  TM Distance: >3 FB Neck ROM: Full    Dental no notable dental hx.    Pulmonary neg pulmonary ROS    + decreased breath sounds      Cardiovascular hypertension, Pt. on medications and Pt. on home beta blockers + dysrhythmias Atrial Fibrillation  Rhythm:Irregular Rate:Normal     Neuro/Psych negative neurological ROS  negative psych ROS   GI/Hepatic negative GI ROS, Neg liver ROS,,,  Endo/Other  negative endocrine ROS    Renal/GU negative Renal ROS  negative genitourinary   Musculoskeletal  (+) Arthritis , Osteoarthritis,    Abdominal  (+) + obese  Peds  Hematology negative hematology ROS (+)   Anesthesia Other Findings   Reproductive/Obstetrics                             Anesthesia Physical Anesthesia Plan  ASA: 3  Anesthesia Plan: General   Post-op Pain Management:    Induction: Intravenous  PONV Risk Score and Plan: 3 and Propofol infusion and Treatment may vary due to age or medical condition  Airway Management Planned: Mask  Additional Equipment: None  Intra-op Plan:   Post-operative Plan:   Informed Consent: I have reviewed the patients History and Physical, chart, labs and discussed the procedure including the risks, benefits and alternatives for the proposed anesthesia with the patient or authorized representative who has indicated his/her understanding and acceptance.     Dental advisory given  Plan Discussed with:   Anesthesia Plan Comments:        Anesthesia Quick Evaluation

## 2022-06-12 ENCOUNTER — Encounter (HOSPITAL_COMMUNITY): Payer: Self-pay | Admitting: Cardiology

## 2022-06-28 ENCOUNTER — Other Ambulatory Visit: Payer: Self-pay | Admitting: Cardiology

## 2022-06-28 DIAGNOSIS — I480 Paroxysmal atrial fibrillation: Secondary | ICD-10-CM

## 2022-06-30 ENCOUNTER — Other Ambulatory Visit: Payer: Self-pay | Admitting: Nurse Practitioner

## 2022-07-02 ENCOUNTER — Ambulatory Visit (INDEPENDENT_AMBULATORY_CARE_PROVIDER_SITE_OTHER): Payer: Medicare Other | Admitting: Orthopedic Surgery

## 2022-07-02 DIAGNOSIS — M65332 Trigger finger, left middle finger: Secondary | ICD-10-CM

## 2022-07-06 ENCOUNTER — Encounter: Payer: Self-pay | Admitting: Orthopedic Surgery

## 2022-07-06 DIAGNOSIS — M65332 Trigger finger, left middle finger: Secondary | ICD-10-CM

## 2022-07-06 MED ORDER — LIDOCAINE HCL 1 % IJ SOLN
1.0000 mL | INTRAMUSCULAR | Status: AC | PRN
Start: 2022-07-06 — End: 2022-07-06
  Administered 2022-07-06: 1 mL

## 2022-07-06 MED ORDER — METHYLPREDNISOLONE ACETATE 40 MG/ML IJ SUSP
40.0000 mg | INTRAMUSCULAR | Status: AC | PRN
Start: 2022-07-06 — End: 2022-07-06
  Administered 2022-07-06: 40 mg

## 2022-07-06 NOTE — Progress Notes (Signed)
Office Visit Note   Patient: Terry Koch           Date of Birth: 1945/04/27           MRN: [id number] Visit Date: 07/02/2022              Requested by: Sharon Seller, NP [address] [place],  [place] [address] PCP: Sharon Seller, NP  Chief Complaint  Patient presents with   Left Hand - Follow-up      HPI: Patient is a 77 year old woman who presents in follow-up for persistent triggering left long finger.  Patient complains of popping snapping and her finger locking.  Assessment & Plan: Visit Diagnoses:  1. Trigger finger, left middle finger     Plan: The A1 pulley was injected she tolerated this well she will continue to work on passive range of motion.  Discussed the possibility of A1 pulley release if the injection does not provide sufficient relief.  Follow-Up Instructions: Return if symptoms worsen or fail to improve.   Ortho Exam  Patient is alert, oriented, no adenopathy, well-dressed, normal affect, normal respiratory effort. Examination patient's hand exam is normal.  She has triggering at the A1 pulley left long finger with catching and locking.  It is painful to palpation.  Imaging: No results found. No images are attached to the encounter.  Labs: Lab Results  Component Value Date   HGBA1C 6.4 (H) 03/23/2022   HGBA1C 5.9 (H) 11/14/2021     Lab Results  Component Value Date   ALBUMIN 4.1 06/05/2020    Lab Results  Component Value Date   MG 2.0 05/22/2022   MG 1.9 10/04/2019   No results found for: "VD25OH"  No results found for: "PREALBUMIN"    Latest Ref Rng & Units 06/10/2022   10:15 AM 03/23/2022   10:13 AM 11/14/2021   10:12 AM  CBC EXTENDED  WBC 3.8 - 10.8 Thousand/uL  4.1  5.1   RBC 3.80 - 5.10 Million/uL  3.86  4.70   Hemoglobin 12.0 - 15.0 g/dL 19.1  47.8  29.5   HCT 36.0 - 46.0 % 36.0  33.1  40.0   Platelets 140 - 400 Thousand/uL  189  190   NEUT# 1,500 - 7,800 cells/uL  2,333  2,769   Lymph# 850 - 3,900  cells/uL  1,394  1,826      There is no height or weight on file to calculate BMI.  Orders:  No orders of the defined types were placed in this encounter.  No orders of the defined types were placed in this encounter.    Procedures: Hand/UE Inj: L long A1 for trigger finger on 07/06/2022 7:31 AM Indications: diagnostic and therapeutic Details: 22 G needle Medications: 1 mL lidocaine 1 %; 40 mg methylPREDNISolone acetate 40 MG/ML Outcome: tolerated well, no immediate complications Procedure, treatment alternatives, risks and benefits explained, specific risks discussed. Consent was given by the patient. Immediately prior to procedure a time out was called to verify the correct patient, procedure, equipment, support staff and site/side marked as required. Patient was prepped and draped in the usual sterile fashion.      Clinical Data: No additional findings.  ROS:  All other systems negative, except as noted in the HPI. Review of Systems  Objective: Vital Signs: There were no vitals taken for this visit.  Specialty Comments:  No specialty comments available.  PMFS History: Patient Active Problem List   Diagnosis Date Noted  Arthritis    Hyperlipidemia    Hypertension    Persistent atrial fibrillation [facility])    Past Medical History:  Diagnosis Date   Arthritis    self report   Bone fracture    Right Foot   H/O hernia repair    History of hysterectomy    History of mammogram    Dr.Elizabeth Pascal Lux   History of MRI    Dr.Elizabeth Pascal Lux.   History of removal of skin mole 03/09/1997   Hyperlipidemia    Hypertension    Scalp hematoma    Wears glasses     Family History  Problem Relation Age of Onset   Breast cancer Mother    Alzheimer's disease Mother    Dementia Mother    Parkinson's disease Mother    Colon polyps Mother    Stroke Father    Dementia Father    Breast cancer Sister    Allergies Daughter    Allergies Daughter    Allergies Son    Colon  cancer Neg Hx    Esophageal cancer Neg Hx    Stomach cancer Neg Hx    Rectal cancer Neg Hx     Past Surgical History:  Procedure Laterality Date   ABDOMINAL HYSTERECTOMY     BREAST BIOPSY  03/09/1997   Dr.Finder   BREAST BIOPSY Right 11/07/2019   FIBROADENOMA WITH CALCIFICATIONS   BREAST BIOPSY Left 11/07/2019   FIBROADENOMA WITH CALCIFICATIONS   CARDIOVERSION N/A 12/25/2019   Procedure: CARDIOVERSION;  Surgeon: Yates Decamp, MD;  Location: [facility] ENDOSCOPY;  Service: Cardiovascular;  Laterality: N/A;   CARDIOVERSION N/A 02/06/2020   Procedure: CARDIOVERSION;  Surgeon: Yates Decamp, MD;  Location[facility] ENDOSCOPY;  Service: Cardiovascular;  Laterality: N/A;   CARDIOVERSION N/A 06/10/2022   Procedure: CARDIOVERSION;  Surgeon: Tessa Lerner, DO;  Location: [facility] ENDOSCOPY;  Service: Cardiovascular;  Laterality: N/A;   CHOLECYSTECTOMY     ~ 6 yrs ago   COLONOSCOPY  2009   Dr.Chesley   CYSTECTOMY  09/04/2019   Dr.Gray   GALLBLADDER SURGERY  03/10/2015   Dr.Amid ( [place] )   POLYPECTOMY     12 polyps removed at [facility]    VENTRAL HERNIA REPAIR  2016   Social History   Occupational History   Not on file  Tobacco Use   Smoking status: Never   Smokeless tobacco: Never  Vaping Use   Vaping Use: Never used  Substance and Sexual Activity   Alcohol use: Yes    Alcohol/week: 1.0 standard drink of alcohol    Types: 1 Glasses of wine per week    Comment: occasionally   Drug use: Never   Sexual activity: Not Currently

## 2022-07-22 ENCOUNTER — Ambulatory Visit: Payer: Medicare Other | Admitting: Cardiology

## 2022-07-22 ENCOUNTER — Encounter: Payer: Self-pay | Admitting: Cardiology

## 2022-07-22 VITALS — BP 130/77 | HR 68 | Resp 16 | Ht 67.0 in | Wt 270.0 lb

## 2022-07-22 DIAGNOSIS — I10 Essential (primary) hypertension: Secondary | ICD-10-CM

## 2022-07-22 DIAGNOSIS — I480 Paroxysmal atrial fibrillation: Secondary | ICD-10-CM

## 2022-07-22 NOTE — Progress Notes (Signed)
Primary Physician/Referring:  Sharon Seller, NP  Patient ID: Terry Koch, female    DOB: Oct 06, 1945, 77 y.o.   MRN: [id number]  Chief Complaint  Patient presents with   Persistent atrial fibrillation    Follow-up    2 months   HPI:    Terry Koch  is a 77 y.o. African-American female with hypertension, hyperlipidemia, obesity, colonic polyps requiring serial surveillance colonoscopy, no prior history of GI bleed underwent cardioversion 12/25/2019 was unsuccessful. Underwent repeat cardioversion with Amiodarone on board successfully to normal sinus rhythm 02/06/2020.  Recurrence of atrial fibrillation, patient underwent direct-current cardioversion on 06/10/2022 and now presents for follow-up.  Symptoms fatigue is resolved since cardioversion.  States that she is presently doing well and remains asymptomatic.  Past Medical History:  Diagnosis Date   Arthritis    self report   Bone fracture    Right Foot   H/O hernia repair    History of hysterectomy    History of mammogram    Dr.Elizabeth Pascal Lux   History of MRI    Dr.Elizabeth Pascal Lux.   History of removal of skin mole 03/09/1997   Hyperlipidemia    Hypertension    Scalp hematoma    Wears glasses      Social History   Tobacco Use   Smoking status: Never   Smokeless tobacco: Never  Substance Use Topics   Alcohol use: Yes    Alcohol/week: 1.0 standard drink of alcohol    Types: 1 Glasses of wine per week    Comment: occasionally   Marital Status: Married  ROS  Review of Systems  Constitutional: Negative for malaise/fatigue.  Cardiovascular:  Negative for chest pain, dyspnea on exertion and leg swelling.   Objective  Blood pressure 130/77, pulse 68, resp. rate 16, height 5\' 7"  (1.702 m), weight 270 lb (122.5 kg), SpO2 97 %.     07/22/2022    2:17 PM 06/10/2022   11:40 AM 06/10/2022   11:30 AM  Vitals with BMI  Height 5\' 7"     Weight 270 lbs    BMI 42.28    Systolic 130 138 409  Diastolic 77  87 59  Pulse 68 49 48     Physical Exam Constitutional:      Appearance: She is obese.  Neck:     Vascular: No carotid bruit or JVD.  Cardiovascular:     Rate and Rhythm: Normal rate and regular rhythm.     Pulses: Intact distal pulses.     Heart sounds: Normal heart sounds. No murmur heard.    No gallop.  Pulmonary:     Effort: Pulmonary effort is normal.     Breath sounds: Normal breath sounds.  Abdominal:     General: Bowel sounds are normal.     Palpations: Abdomen is soft.  Musculoskeletal:     Right lower leg: No edema.     Left lower leg: No edema.    Laboratory examination:   Lab Results  Component Value Date   NA 140 06/10/2022   K 3.9 06/10/2022   CO2 21 05/22/2022   GLUCOSE 103 (H) 06/10/2022   BUN 16 06/10/2022   CREATININE 1.00 06/10/2022   CALCIUM 9.6 05/22/2022   EGFR 45 (L) 05/22/2022   GFRNONAA 57 (L) 12/07/2019  She is presently stable however hemoglobin is trended down.     Latest Ref Rng & Units 06/10/2022   10:15 AM 05/22/2022    3:10 PM 03/23/2022   10:13 AM  CMP  Glucose 70 - 99 mg/dL 295  91  98   BUN 8 - 23 mg/dL 16  17  14    Creatinine 0.44 - 1.00 mg/dL 6.21  3.08  6.57   Sodium 135 - 145 mmol/L 140  142  141   Potassium 3.5 - 5.1 mmol/L 3.9  4.3  4.0   Chloride 98 - 111 mmol/L 104  104  106   CO2 20 - 29 mmol/L  21  29   Calcium 8.7 - 10.3 mg/dL  9.6  9.7   Total Protein 6.1 - 8.1 g/dL   6.6   Total Bilirubin 0.2 - 1.2 mg/dL   0.4   AST 10 - 35 U/L   22   ALT 6 - 29 U/L   18       Latest Ref Rng & Units 06/10/2022   10:15 AM 03/23/2022   10:13 AM 11/14/2021   10:12 AM  CBC  WBC 3.8 - 10.8 Thousand/uL  4.1  5.1   Hemoglobin 12.0 - 15.0 g/dL 84.6  96.2  95.2   Hematocrit 36.0 - 46.0 % 36.0  33.1  40.0   Platelets 140 - 400 Thousand/uL  189  190     Lipid Panel Recent Labs    11/14/21 1012 03/23/22 1013  CHOL 191 145  TRIG 126 129  LDLCALC 115* 79  HDL 52 43*  CHOLHDL 3.7 3.4   Lab Results  Component Value Date   TSH  0.95 10/04/2019    Last vitamin D No results found for: "25OHVITD2", "25OHVITD3", "VD25OH"   Medications and allergies   Allergies  Allergen Reactions   Amlodipine Other (See Comments)    Leg edema     Medications after today's encounter:  Current Outpatient Medications:    acetaminophen (TYLENOL) 650 MG CR tablet, Take 650 mg by mouth in the morning and at bedtime., Disp: , Rfl:    amiodarone (PACERONE) 200 MG tablet, Take 1 tablet (200 mg total) by mouth daily., Disp: 90 tablet, Rfl: 3   Ascorbic Acid (VITAMIN C) 1000 MG tablet, Take 1,000 mg by mouth in the morning., Disp: , Rfl:    Cholecalciferol (VITAMIN D3) 25 MCG (1000 UT) CAPS, Take 1,000 Units by mouth in the morning., Disp: , Rfl:    ELIQUIS 5 MG TABS tablet, TAKE 1 TABLET(5 MG) BY MOUTH TWICE DAILY, Disp: 60 tablet, Rfl: 0   metoprolol succinate (TOPROL-XL) 50 MG 24 hr tablet, TAKE 1 TABLET(50 MG) BY MOUTH TWICE DAILY WITH OR IMMEDIATELY FOLLOWING A MEAL (Patient taking differently: 50 mg daily.), Disp: 180 tablet, Rfl: 1   Misc Natural Products (ELDERBERRY IMMUNE COMPLEX) CHEW, Chew 50 mg by mouth in the morning., Disp: , Rfl:    Multiple Vitamins-Minerals (COMPLETE MULTIVITAMIN/MINERAL PO), Take 1 tablet by mouth in the morning. [employer] Vitamin, Disp: , Rfl:    NON FORMULARY, Apply 1 application topically at bedtime. Veterinary liniment gel, Disp: , Rfl:    NON FORMULARY, Apply 1 application topically as needed. Body balm with magnesium 12mg , Disp: , Rfl:    OMEGA-3 KRILL OIL PO, Take 350 mg by mouth in the morning., Disp: , Rfl:    Psyllium (METAMUCIL FIBER PO), Take 2 tablets by mouth at bedtime. Gummies, Disp: , Rfl:    rosuvastatin (CRESTOR) 10 MG tablet, Take 1 tablet (10 mg total) by mouth daily. (Patient taking differently: Take 10 mg by mouth every evening.), Disp: 90 tablet, Rfl: 3   spironolactone (ALDACTONE) 25 MG  tablet, Take 1 tablet (25 mg total) by mouth every morning. (Patient taking differently:  Take 25 mg by mouth in the morning.), Disp: 90 tablet, Rfl: 3   valsartan-hydrochlorothiazide (DIOVAN-HCT) 320-25 MG tablet, TAKE 1 TABLET BY MOUTH EVERY MORNING, Disp: 90 tablet, Rfl: 3   vitamin B-12 (CYANOCOBALAMIN) 1000 MCG tablet, Take 1,000 mcg by mouth in the morning., Disp: , Rfl:    Zinc 50 MG TABS, Take 1 tablet by mouth daily., Disp: , Rfl:     Radiology:   No results found.  Cardiac Studies:   Exercise Myoview stress test 10/18/2019: Exercise nuclear stress test was performed using Bruce protocol. Patient reached 5.1 METS, and 98% of age predicted maximum heart rate. Exercise capacity was low. No chest pain reported. Heart rate and hemodynamic response were normal. Stress EKG revealed no ischemic changes. Normal myocardial perfusion. Stress LVEF calculated 49%, although visually appears normal. Low risk study.  Echocardiogram 10/25/2019:  Left ventricle cavity is normal in size. Mild concentric hypertrophy of  the left ventricle. Normal global wall motion. Normal LV systolic function with EF 55%. Doppler evidence of grade II (pseudonormal) diastolic dysfunction.  Left atrial cavity is severely dilated. Integrity of interatrial septum could not be adequately assessed to limited visualization. Consider  alternate imaging study, if clinically indicated.  Trileaflet aortic valve.  Trace aortic regurgitation.  Mild tricuspid regurgitation. No evidence of pulmonary hypertension.  Cardioversion 12/25/2019:  Indication symptomatic A. Fibrillation. Procedure: Using 90 mg of IV Propofol and 100 IV Lidocaine (for reducing venous pain) for achieving deep sedation, synchronized direct current cardioversion performed. Patient was delivered with 150 x 1 then 200 Joules of electricity X 3 without success. Patient tolerated the procedure well. No immediate complication noted.    Direct current cardioversion 02/06/20 : Indication symptomatic A. Fibrillation. Procedure: Using 100 mg of IV Propofol  and 40 IV Lidocaine (for reducing venous pain) for achieving deep sedation, synchronized direct current cardioversion performed. Patient was delivered with 200J x 1 then 350 Joules of electricity X 1 with success to NSR. Patient tolerated the procedure well. No immediate complication noted.    EKG:   EKG 07/22/2022: Normal sinus rhythm at rate of 64 beats minute, left atrial enlargement.  Normal QT interval.  No evidence of ischemia.  Compared to 05/22/2022, atrial fibrillation no longer present.  Assessment     ICD-10-CM   1. Paroxysmal atrial fibrillation (HCC)  I48.0 EKG 12-Lead    2. Primary hypertension  I10        CHA2DS2-VASc Score is 4.  Yearly risk of stroke: 4.8% (A, F, HTN).   There are no discontinued medications.   No orders of the defined types were placed in this encounter.  Recommendations:   Taimi D Ervin is a 77 y.o. African-American female with hypertension, hyperlipidemia, obesity, colonic polyps requiring serial surveillance colonoscopy, no prior history of GI bleed underwent cardioversion 12/25/2019 was unsuccessful. Underwent repeat cardioversion with Amiodarone on board successfully to normal sinus rhythm 02/06/2020.  Recurrence of atrial fibrillation, patient underwent direct-current cardioversion on 06/10/2022 and now presents for follow-up.  1. Paroxysmal atrial fibrillation (HCC) Patient is presently maintaining sinus rhythm, presently on amiodarone, continue the same.  She remains asymptomatic.  She has not had any further fatigue or dyspnea since cardioversion. - EKG 12-Lead  2. Primary hypertension Blood pressure under excellent control.  Weight loss again discussed with the patient along with fluid restriction and salt restriction.  Stable from cardiac standpoint, I will see her back in  6 months for follow-up.  She is tolerating anticoagulation without bleeding diathesis.   Yates Decamp, MD, [facility] 07/22/2022, 2:56 PM Office: [phone] Fax:  [phone] Pager: [phone]

## 2022-09-01 ENCOUNTER — Ambulatory Visit (HOSPITAL_COMMUNITY)
Admission: EM | Admit: 2022-09-01 | Discharge: 2022-09-01 | Disposition: A | Discharge status: Home / Self Care | Payer: Medicare Other | Attending: Emergency Medicine | Admitting: Emergency Medicine

## 2022-09-01 ENCOUNTER — Encounter (HOSPITAL_COMMUNITY): Payer: Self-pay | Admitting: *Deleted

## 2022-09-01 DIAGNOSIS — W57XXXA Bitten or stung by nonvenomous insect and other nonvenomous arthropods, initial encounter: Secondary | ICD-10-CM | POA: Diagnosis not present

## 2022-09-01 DIAGNOSIS — S30861A Insect bite (nonvenomous) of abdominal wall, initial encounter: Secondary | ICD-10-CM | POA: Diagnosis not present

## 2022-09-01 MED ORDER — TRIAMCINOLONE ACETONIDE 0.1 % EX CREA: 1.0000 | TOPICAL_CREAM | Freq: Two times a day (BID) | CUTANEOUS | 0 refills | Status: DC

## 2022-09-01 MED ORDER — CEPHALEXIN 500 MG PO CAPS: 1000.0000 mg | ORAL_CAPSULE | Freq: Two times a day (BID) | ORAL | 0 refills | Status: AC

## 2022-09-01 NOTE — ED Triage Notes (Signed)
Pt states she was stung by a bee on Sunday and she is now red and itchy on her right side. She has been using benadryl and alcohol to help without relief.

## 2022-09-01 NOTE — Discharge Instructions (Signed)
Keep this clean with soap and water.  Finish the Keflex, even if you feel better.  Triamcinolone will help with itching and swelling.  Cool compresses to the area.  Follow-up with your primary care provider or you can return here if you do not improve shortly.

## 2022-09-01 NOTE — ED Provider Notes (Signed)
HPI  SUBJECTIVE:  Terry Koch is a 77 y.o. female who presents with an erythematous, pruritic area of increasing size after being stung by wasp on her right abdomen 2 days ago.  She reports swelling, burning pain and increased temperature.  No fevers, pain with palpation.  She has tried topical Benadryl with temporary improvement in her symptoms and has been keeping it clean with alcohol.  No hearing factors.  She has a past medical history of hypertension, hypercholesterolemia atrial fibrillation on Eliquis.  No history of MRSA, chronic kidney disease.  PCP: [facility].   Past Medical History:  Diagnosis Date   Arthritis    self report   Bone fracture    Right Foot   H/O hernia repair    History of hysterectomy    History of mammogram    Dr.Elizabeth Pascal Lux   History of MRI    Dr.Elizabeth Pascal Lux.   History of removal of skin mole 03/09/1997   Hyperlipidemia    Hypertension    Scalp hematoma    Wears glasses     Past Surgical History:  Procedure Laterality Date   ABDOMINAL HYSTERECTOMY     BREAST BIOPSY  03/09/1997   Dr.Finder   BREAST BIOPSY Right 11/07/2019   FIBROADENOMA WITH CALCIFICATIONS   BREAST BIOPSY Left 11/07/2019   FIBROADENOMA WITH CALCIFICATIONS   CARDIOVERSION N/A 12/25/2019   Procedure: CARDIOVERSION;  Surgeon: Yates Decamp, MD;  Location: [facility] ENDOSCOPY;  Service: Cardiovascular;  Laterality: N/A;   CARDIOVERSION N/A 02/06/2020   Procedure: CARDIOVERSION;  Surgeon: Yates Decamp, MD;  Location: [facility] ENDOSCOPY;  Service: Cardiovascular;  Laterality: N/A;   CARDIOVERSION N/A 06/10/2022   Procedure: CARDIOVERSION;  Surgeon: Tessa Lerner, DO;  Location: [facility] ENDOSCOPY;  Service: Cardiovascular;  Laterality: N/A;   CHOLECYSTECTOMY     ~ 6 yrs ago   COLONOSCOPY  2009   Dr.Chesley   CYSTECTOMY  09/04/2019   Dr.Gray   GALLBLADDER SURGERY  03/10/2015   Dr.Amid ( [place] )   POLYPECTOMY     12 polyps removed at [facility]    VENTRAL HERNIA REPAIR   2016    Family History  Problem Relation Age of Onset   Breast cancer Mother    Alzheimer's disease Mother    Dementia Mother    Parkinson's disease Mother    Colon polyps Mother    Stroke Father    Dementia Father    Breast cancer Sister    Allergies Daughter    Allergies Daughter    Allergies Son    Colon cancer Neg Hx    Esophageal cancer Neg Hx    Stomach cancer Neg Hx    Rectal cancer Neg Hx     Social History   Tobacco Use   Smoking status: Never   Smokeless tobacco: Never  Vaping Use   Vaping Use: Never used  Substance Use Topics   Alcohol use: Yes    Alcohol/week: 1.0 standard drink of alcohol    Types: 1 Glasses of wine per week    Comment: occasionally   Drug use: Never    No current facility-administered medications for this encounter.  Current Outpatient Medications:    acetaminophen (TYLENOL) 650 MG CR tablet, Take 650 mg by mouth in the morning and at bedtime., Disp: , Rfl:    amiodarone (PACERONE) 200 MG tablet, Take 1 tablet (200 mg total) by mouth daily., Disp: 90 tablet, Rfl: 3   Ascorbic Acid (VITAMIN C) 1000 MG tablet, Take 1,000  mg by mouth in the morning., Disp: , Rfl:    cephALEXin (KEFLEX) 500 MG capsule, Take 2 capsules (1,000 mg total) by mouth 2 (two) times daily for 5 days., Disp: 20 capsule, Rfl: 0   Cholecalciferol (VITAMIN D3) 25 MCG (1000 UT) CAPS, Take 1,000 Units by mouth in the morning., Disp: , Rfl:    ELIQUIS 5 MG TABS tablet, TAKE 1 TABLET(5 MG) BY MOUTH TWICE DAILY, Disp: 60 tablet, Rfl: 0   metoprolol succinate (TOPROL-XL) 50 MG 24 hr tablet, TAKE 1 TABLET(50 MG) BY MOUTH TWICE DAILY WITH OR IMMEDIATELY FOLLOWING A MEAL (Patient taking differently: 50 mg daily.), Disp: 180 tablet, Rfl: 1   Misc Natural Products (ELDERBERRY IMMUNE COMPLEX) CHEW, Chew 50 mg by mouth in the morning., Disp: , Rfl:    Multiple Vitamins-Minerals (COMPLETE MULTIVITAMIN/MINERAL PO), Take 1 tablet by mouth in the morning. [employer] Vitamin, Disp: ,  Rfl:    NON FORMULARY, Apply 1 application topically at bedtime. Veterinary liniment gel, Disp: , Rfl:    NON FORMULARY, Apply 1 application topically as needed. Body balm with magnesium 12mg , Disp: , Rfl:    OMEGA-3 KRILL OIL PO, Take 350 mg by mouth in the morning., Disp: , Rfl:    Psyllium (METAMUCIL FIBER PO), Take 2 tablets by mouth at bedtime. Gummies, Disp: , Rfl:    rosuvastatin (CRESTOR) 10 MG tablet, Take 1 tablet (10 mg total) by mouth daily. (Patient taking differently: Take 10 mg by mouth every evening.), Disp: 90 tablet, Rfl: 3   spironolactone (ALDACTONE) 25 MG tablet, Take 1 tablet (25 mg total) by mouth every morning. (Patient taking differently: Take 25 mg by mouth in the morning.), Disp: 90 tablet, Rfl: 3   triamcinolone cream (KENALOG) 0.1 %, Apply 1 Application topically 2 (two) times daily. Apply for 2 weeks. May use on face, Disp: 30 g, Rfl: 0   valsartan-hydrochlorothiazide (DIOVAN-HCT) 320-25 MG tablet, TAKE 1 TABLET BY MOUTH EVERY MORNING, Disp: 90 tablet, Rfl: 3   vitamin B-12 (CYANOCOBALAMIN) 1000 MCG tablet, Take 1,000 mcg by mouth in the morning., Disp: , Rfl:    Zinc 50 MG TABS, Take 1 tablet by mouth daily., Disp: , Rfl:   Allergies  Allergen Reactions   Amlodipine Other (See Comments)    Leg edema     ROS  As noted in HPI.   Physical Exam  BP (!) 144/69 (BP Location: Right Arm)   Pulse (!) 57   Temp 97.7 F (36.5 C) (Oral)   Resp 18   SpO2 96%   Constitutional: Well developed, well nourished, no acute distress Eyes:  EOMI, conjunctiva normal bilaterally HENT: Normocephalic, atraumatic,mucus membranes moist Respiratory: Normal inspiratory effort Cardiovascular: Normal rate GI: nondistended skin: 14 x 7 cm nontender area flat, blanchable erythema with increased temperature on right upper abdomen with central sting wound.  Mild induration surrounding the sting wound.  No expressible purulent drainage.  Marked area of erythema with a marker for  reference   Musculoskeletal: no deformities Neurologic: Alert & oriented x 3, no focal neuro deficits Psychiatric: Speech and behavior appropriate   ED Course   Medications - No data to display  No orders of the defined types were placed in this encounter.   No results found for this or any previous visit (from the past 24 hour(s)). No results found.  ED Clinical Impression  1. Bug bite with infection, initial encounter      ED Assessment/Plan     Patient presents with a  vespid sting that I am concerned is now secondarily infected.  Home with Keflex for 5 days, triamcinolone cream.  Cool compresses, keep clean with soap and water.  Follow-up with PCP as needed.  Discussed MDM, treatment plan, and plan for follow-up with patient. . patient agrees with plan.   Meds ordered this encounter  Medications   cephALEXin (KEFLEX) 500 MG capsule    Sig: Take 2 capsules (1,000 mg total) by mouth 2 (two) times daily for 5 days.    Dispense:  20 capsule    Refill:  0   triamcinolone cream (KENALOG) 0.1 %    Sig: Apply 1 Application topically 2 (two) times daily. Apply for 2 weeks. May use on face    Dispense:  30 g    Refill:  0      *This clinic note was created using Scientist, clinical (histocompatibility and immunogenetics). Therefore, there may be occasional mistakes despite careful proofreading.  ?    Domenick Gong, MD 09/01/22 [phone]

## 2022-09-08 ENCOUNTER — Encounter: Payer: Self-pay | Admitting: Gastroenterology

## 2022-09-18 ENCOUNTER — Ambulatory Visit: Payer: Medicare Other | Admitting: Nurse Practitioner

## 2022-10-06 ENCOUNTER — Other Ambulatory Visit: Payer: Medicare Other

## 2022-10-16 ENCOUNTER — Ambulatory Visit (INDEPENDENT_AMBULATORY_CARE_PROVIDER_SITE_OTHER): Payer: Medicare Other | Admitting: Nurse Practitioner

## 2022-10-16 ENCOUNTER — Encounter: Payer: Self-pay | Admitting: Nurse Practitioner

## 2022-10-16 VITALS — BP 136/84 | HR 79 | Temp 97.4°F | Ht 67.0 in | Wt 272.0 lb

## 2022-10-16 DIAGNOSIS — I4819 Other persistent atrial fibrillation: Secondary | ICD-10-CM | POA: Diagnosis not present

## 2022-10-16 DIAGNOSIS — D649 Anemia, unspecified: Secondary | ICD-10-CM | POA: Diagnosis not present

## 2022-10-16 DIAGNOSIS — M1990 Unspecified osteoarthritis, unspecified site: Secondary | ICD-10-CM

## 2022-10-16 DIAGNOSIS — D6869 Other thrombophilia: Secondary | ICD-10-CM | POA: Diagnosis not present

## 2022-10-16 DIAGNOSIS — I10 Essential (primary) hypertension: Secondary | ICD-10-CM

## 2022-10-16 DIAGNOSIS — R739 Hyperglycemia, unspecified: Secondary | ICD-10-CM

## 2022-10-16 DIAGNOSIS — E782 Mixed hyperlipidemia: Secondary | ICD-10-CM

## 2022-10-16 LAB — CBC WITH DIFFERENTIAL/PLATELET
Absolute Monocytes: 484 cells/uL (ref 200–950)
Basophils Absolute: 30 cells/uL (ref 0–200)
Basophils Relative: 0.5 %
Eosinophils Absolute: 53 cells/uL (ref 15–500)
Eosinophils Relative: 0.9 %
HCT: 39 % (ref 35.0–45.0)
Hemoglobin: 12.3 g/dL (ref 11.7–15.5)
Lymphs Abs: 1853 cells/uL (ref 850–3900)
MCH: 27.4 pg (ref 27.0–33.0)
MCHC: 31.5 g/dL — ABNORMAL LOW (ref 32.0–36.0)
MCV: 86.9 fL (ref 80.0–100.0)
MPV: 10.9 fL (ref 7.5–12.5)
Monocytes Relative: 8.2 %
Neutro Abs: 3481 cells/uL (ref 1500–7800)
Neutrophils Relative %: 59 %
Platelets: 234 10*3/uL (ref 140–400)
RBC: 4.49 10*6/uL (ref 3.80–5.10)
RDW: 14.2 % (ref 11.0–15.0)
Total Lymphocyte: 31.4 %
WBC: 5.9 10*3/uL (ref 3.8–10.8)

## 2022-10-16 NOTE — Progress Notes (Unsigned)
Careteam: Patient Care Team: Sharon Seller, NP as PCP - General (Geriatric Medicine)  PLACE OF SERVICE:  [facility]  Advanced Directive information Does Patient Have a Medical Advance Directive?: Yes, Type of Advance Directive: Out of facility DNR (pink MOST or yellow form), Pre-existing out of facility DNR order (yellow form or pink MOST form): Pink MOST form placed in chart (order not valid for inpatient use), Does patient want to make changes to medical advance directive?: No - Patient declined  Allergies  Allergen Reactions   Amlodipine Other (See Comments)    Leg edema    Chief Complaint  Patient presents with   Medical Management of Chronic Issues    6 mponth follow-up. Discuss weird dreams since starting metoprolol. Moderate fall risk. Low energy in the morning.      HPI: Patient is a 77 y.o. female for follow up.  She is due for her colonoscopy but concerned since she was diagnosised with a fib.  She just had last colonoscopy 3 years ago but they found multiple polyps.   She has scheduled her bone density.   She went to orthopedics and had trigger finger shot and now feeling good.   She continues to help her daughter and back and forth to [place]. Her son-in-law had 2 strokes back to back. Not talking or walking now and needs a lot of care. This is stressful for her and make her worry about her daughter.   Has vivid dreams- question if it is related to her metoprolol. Feels like it started around that time.   She has a feeling in the right ankle. Not a pain, feels like it is a strain or stress. Continues to be able to walk  She drinks a lot of water and liquids but then will feel like she has to urinate.   She is tired in the morning but has her energy later in the day. Used to be more energetic all the time.   Reports she fell twice this week- went to sit down on a stool to get clothes out of the drawer and just missed the stool.   Numbness in her hand  when she wakes up in the morning but better once she is up and out of the bed    A fib- had to have cardioversion back in April. She was exhausted after travel and felt like this brought on her a fib.   Review of Systems:  Review of Systems  Constitutional:  Negative for chills, fever and weight loss.  HENT:  Negative for tinnitus.   Respiratory:  Negative for cough, sputum production and shortness of breath.   Cardiovascular:  Negative for chest pain, palpitations and leg swelling.  Gastrointestinal:  Negative for abdominal pain, constipation, diarrhea and heartburn.  Genitourinary:  Negative for dysuria, frequency and urgency.  Musculoskeletal:  Negative for back pain, falls, joint pain and myalgias.  Skin: Negative.   Neurological:  Negative for dizziness and headaches.  Psychiatric/Behavioral:  Negative for depression and memory loss. The patient does not have insomnia.     Past Medical History:  Diagnosis Date   Arthritis    self report   Bone fracture    Right Foot   H/O hernia repair    History of hysterectomy    History of mammogram    Dr.Elizabeth Pascal Lux   History of MRI    Dr.Elizabeth Pascal Lux.   History of removal of skin mole 03/09/1997   Hyperlipidemia  Hypertension    Scalp hematoma    Wears glasses    Past Surgical History:  Procedure Laterality Date   ABDOMINAL HYSTERECTOMY     BREAST BIOPSY  03/09/1997   Dr.Finder   BREAST BIOPSY Right 11/07/2019   FIBROADENOMA WITH CALCIFICATIONS   BREAST BIOPSY Left 11/07/2019   FIBROADENOMA WITH CALCIFICATIONS   CARDIOVERSION N/A 12/25/2019   Procedure: CARDIOVERSION;  Surgeon: Yates Decamp, MD;  Location: [facility] ENDOSCOPY;  Service: Cardiovascular;  Laterality: N/A;   CARDIOVERSION N/A 02/06/2020   Procedure: CARDIOVERSION;  Surgeon: Yates Decamp, MD;  Location: [facility] ENDOSCOPY;  Service: Cardiovascular;  Laterality: N/A;   CARDIOVERSION N/A 06/10/2022   Procedure: CARDIOVERSION;  Surgeon: Tessa Lerner, DO;  Location: [facility]  ENDOSCOPY;  Service: Cardiovascular;  Laterality: N/A;   CHOLECYSTECTOMY     ~ 6 yrs ago   COLONOSCOPY  2009   Dr.Chesley   CYSTECTOMY  09/04/2019   Dr.Gray   GALLBLADDER SURGERY  03/10/2015   Dr.Amid ( [place] )   POLYPECTOMY     12 polyps removed at  GI    VENTRAL HERNIA REPAIR  2016   Social History:   reports that she has never smoked. She has never used smokeless tobacco. She reports current alcohol use of about 1.0 standard drink of alcohol per week. She reports that she does not use drugs.  Family History  Problem Relation Age of Onset   Breast cancer Mother    Alzheimer's disease Mother    Dementia Mother    Parkinson's disease Mother    Colon polyps Mother    Stroke Father    Dementia Father    Breast cancer Sister    Allergies Daughter    Allergies Daughter    Allergies Son    Colon cancer Neg Hx    Esophageal cancer Neg Hx    Stomach cancer Neg Hx    Rectal cancer Neg Hx     Medications: Patient's Medications  New Prescriptions   No medications on file  Previous Medications   ACETAMINOPHEN (TYLENOL) 650 MG CR TABLET    Take 650 mg by mouth in the morning and at bedtime.   AMIODARONE (PACERONE) 200 MG TABLET    Take 1 tablet (200 mg total) by mouth daily.   ASCORBIC ACID (VITAMIN C) 1000 MG TABLET    Take 1,000 mg by mouth in the morning.   CHOLECALCIFEROL (VITAMIN D3) 25 MCG (1000 UT) CAPS    Take 1,000 Units by mouth in the morning.   ELIQUIS 5 MG TABS TABLET    TAKE 1 TABLET(5 MG) BY MOUTH TWICE DAILY   LORATADINE (CLARITIN) 10 MG TABLET    Take 10 mg by mouth daily.   METOPROLOL SUCCINATE (TOPROL-XL) 50 MG 24 HR TABLET    Take 50 mg by mouth daily. Take with or immediately following a meal.   MISC NATURAL PRODUCTS (ELDERBERRY IMMUNE COMPLEX) CHEW    Chew 50 mg by mouth in the morning.   MULTIPLE VITAMINS-MINERALS (COMPLETE MULTIVITAMIN/MINERAL PO)    Take 1 tablet by mouth in the morning. [employer] Vitamin   NON FORMULARY    Apply 1  application topically at bedtime. Veterinary liniment gel   NON FORMULARY    Apply 1 application topically as needed. Body balm with magnesium 12mg    OMEGA-3 KRILL OIL PO    Take 350 mg by mouth in the morning.   PSYLLIUM (METAMUCIL FIBER PO)    Take 2 tablets by mouth at bedtime. Gummies  ROSUVASTATIN (CRESTOR) 10 MG TABLET    Take 1 tablet (10 mg total) by mouth daily.   SPIRONOLACTONE (ALDACTONE) 25 MG TABLET    Take 1 tablet (25 mg total) by mouth every morning.   VALSARTAN-HYDROCHLOROTHIAZIDE (DIOVAN-HCT) 320-25 MG TABLET    TAKE 1 TABLET BY MOUTH EVERY MORNING   VITAMIN B-12 (CYANOCOBALAMIN) 1000 MCG TABLET    Take 1,000 mcg by mouth in the morning.   ZINC 50 MG TABS    Take 1 tablet by mouth daily.  Modified Medications   No medications on file  Discontinued Medications   METOPROLOL SUCCINATE (TOPROL-XL) 50 MG 24 HR TABLET    TAKE 1 TABLET(50 MG) BY MOUTH TWICE DAILY WITH OR IMMEDIATELY FOLLOWING A MEAL   TRIAMCINOLONE CREAM (KENALOG) 0.1 %    Apply 1 Application topically 2 (two) times daily. Apply for 2 weeks. May use on face    Physical Exam:  Vitals:   10/16/22 1526  BP: 136/84  Pulse: 79  Temp: (!) 97.4 F (36.3 C)  TempSrc: Temporal  SpO2: 96%  Weight: 272 lb (123.4 kg)  Height: 5\' 7"  (1.702 m)   Body mass index is 42.6 kg/m. Wt Readings from Last 3 Encounters:  10/16/22 272 lb (123.4 kg)  07/22/22 270 lb (122.5 kg)  06/10/22 268 lb (121.6 kg)    Physical Exam Constitutional:      General: She is not in acute distress.    Appearance: She is well-developed. She is not diaphoretic.  HENT:     Head: Normocephalic and atraumatic.     Mouth/Throat:     Pharynx: No oropharyngeal exudate.  Eyes:     Conjunctiva/sclera: Conjunctivae normal.     Pupils: Pupils are equal, round, and reactive to light.  Cardiovascular:     Rate and Rhythm: Normal rate and regular rhythm.     Heart sounds: Normal heart sounds.  Pulmonary:     Effort: Pulmonary effort is normal.      Breath sounds: Normal breath sounds.  Abdominal:     General: Bowel sounds are normal.     Palpations: Abdomen is soft.  Musculoskeletal:     Cervical back: Normal range of motion and neck supple.     Right lower leg: No edema.     Left lower leg: No edema.  Skin:    General: Skin is warm and dry.  Neurological:     Mental Status: She is alert.  Psychiatric:        Mood and Affect: Mood normal.     Labs reviewed: Basic Metabolic Panel: Recent Labs    11/14/21 1012 03/23/22 1013 05/22/22 1510 06/10/22 1015  NA 140 141 142 140  K 3.8 4.0 4.3 3.9  CL 105 106 104 104  CO2 27 29 21   --   GLUCOSE 108* 98 91 103*  BUN 12 14 17 16   CREATININE 1.05* 0.97 1.23* 1.00  CALCIUM 9.3 9.7 9.6  --   MG  --   --  2.0  --    Liver Function Tests: Recent Labs    11/14/21 1012 03/23/22 1013  AST 21 22  ALT 20 18  BILITOT 0.4 0.4  PROT 6.7 6.6   No results for input(s): "LIPASE", "AMYLASE" in the last 8760 hours. No results for input(s): "AMMONIA" in the last 8760 hours. CBC: Recent Labs    11/14/21 1012 03/23/22 1013 06/10/22 1015  WBC 5.1 4.1  --   NEUTROABS 2,769 2,333  --   HGB  13.0 10.8* 12.2  HCT 40.0 33.1* 36.0  MCV 85.1 85.8  --   PLT 190 189  --    Lipid Panel: Recent Labs    11/14/21 1012 03/23/22 1013  CHOL 191 145  HDL 52 43*  LDLCALC 115* 79  TRIG 126 129  CHOLHDL 3.7 3.4   TSH: No results for input(s): "TSH" in the last 8760 hours. A1C: Lab Results  Component Value Date   HGBA1C 6.4 (H) 03/23/2022     Assessment/Plan 1. Persistent atrial fibrillation (HCC) -rate controlled, continues on metoprolol and amiodarone. -continues to follow up with cardiology - COMPLETE METABOLIC PANEL WITH GFR - CBC with Differential/Platelet - TSH  2. 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 Anemia, unspecified type -will follow up lab - CBC with Differential/Platelet  4. Essential hypertension -Blood pressure well  controlled, goal bp <140/90 Continue current medications and dietary modifications follow metabolic panel - COMPLETE METABOLIC PANEL WITH GFR - CBC with Differential/Platelet  5. Mixed hyperlipidemia -continues on crestor with dietary modification - Lipid panel - CBC with Differential/Platelet  6. Hyperglycemia -encouraged dietary modification - Hemoglobin A1c  7. Arthritis Stable, can use tylenol as needed pain Encouraged weight loss and mobilty  8. Morbid obesity (HCC) -education provided on healthy weight loss through increase in physical activity and proper nutrition    Return in about 6 months (around 04/18/2023) for routine follow up.  [name]. [facility]ag[facility] [phone]

## 2022-10-17 ENCOUNTER — Other Ambulatory Visit: Payer: Self-pay | Admitting: Cardiology

## 2022-10-17 DIAGNOSIS — I4819 Other persistent atrial fibrillation: Secondary | ICD-10-CM

## 2022-10-19 NOTE — Telephone Encounter (Signed)
Refill needed

## 2022-11-24 ENCOUNTER — Other Ambulatory Visit: Payer: Self-pay | Admitting: Cardiology

## 2022-11-24 ENCOUNTER — Other Ambulatory Visit: Payer: Self-pay | Admitting: Nurse Practitioner

## 2022-11-24 DIAGNOSIS — I10 Essential (primary) hypertension: Secondary | ICD-10-CM

## 2022-12-15 ENCOUNTER — Encounter (HOSPITAL_COMMUNITY): Payer: Self-pay | Admitting: Emergency Medicine

## 2022-12-15 ENCOUNTER — Emergency Department (HOSPITAL_COMMUNITY)
Admission: EM | Admit: 2022-12-15 | Discharge: 2022-12-16 | Disposition: A | Discharge status: Home / Self Care | Payer: Medicare Other | Attending: Emergency Medicine | Admitting: Emergency Medicine

## 2022-12-15 ENCOUNTER — Ambulatory Visit (HOSPITAL_COMMUNITY)
Admission: EM | Admit: 2022-12-15 | Discharge: 2022-12-15 | Disposition: A | Discharge status: Home / Self Care | Payer: Medicare Other | Attending: Emergency Medicine | Admitting: Emergency Medicine

## 2022-12-15 ENCOUNTER — Telehealth: Payer: Self-pay | Admitting: Cardiology

## 2022-12-15 ENCOUNTER — Other Ambulatory Visit: Payer: Self-pay

## 2022-12-15 ENCOUNTER — Emergency Department (HOSPITAL_COMMUNITY): Payer: Medicare Other

## 2022-12-15 DIAGNOSIS — I4891 Unspecified atrial fibrillation: Secondary | ICD-10-CM

## 2022-12-15 DIAGNOSIS — R0602 Shortness of breath: Secondary | ICD-10-CM | POA: Diagnosis not present

## 2022-12-15 DIAGNOSIS — R531 Weakness: Secondary | ICD-10-CM | POA: Insufficient documentation

## 2022-12-15 DIAGNOSIS — Z7901 Long term (current) use of anticoagulants: Secondary | ICD-10-CM | POA: Insufficient documentation

## 2022-12-15 DIAGNOSIS — R079 Chest pain, unspecified: Secondary | ICD-10-CM | POA: Diagnosis present

## 2022-12-15 LAB — TROPONIN I (HIGH SENSITIVITY): Troponin I (High Sensitivity): 5 ng/L (ref <18)

## 2022-12-15 LAB — CBC
HCT: 39.3 % (ref 36.0–46.0)
Hemoglobin: 12.5 g/dL (ref 12.0–15.0)
MCH: 28 pg (ref 26.0–34.0)
MCHC: 31.8 g/dL (ref 30.0–36.0)
MCV: 88.1 fL (ref 80.0–100.0)
Platelets: 195 10*3/uL (ref 150–400)
RBC: 4.46 MIL/uL (ref 3.87–5.11)
RDW: 13.8 % (ref 11.5–15.5)
WBC: 6 10*3/uL (ref 4.0–10.5)
nRBC: 0 % (ref 0.0–0.2)

## 2022-12-15 LAB — BASIC METABOLIC PANEL
Anion gap: 12 (ref 5–15)
BUN: 16 mg/dL (ref 8–23)
CO2: 24 mmol/L (ref 22–32)
Calcium: 9.2 mg/dL (ref 8.9–10.3)
Chloride: 101 mmol/L (ref 98–111)
Creatinine, Ser: 1.13 mg/dL — ABNORMAL HIGH (ref 0.44–1.00)
GFR, Estimated: 50 mL/min — ABNORMAL LOW (ref >60)
Glucose, Bld: 96 mg/dL (ref 70–99)
Potassium: 3.9 mmol/L (ref 3.5–5.1)
Sodium: 137 mmol/L (ref 135–145)

## 2022-12-15 NOTE — ED Notes (Signed)
Patient is being discharged from the Urgent Care and sent to the Emergency Department via POV . Per Wynonia Lawman, NP, patient is in need of higher level of care due to chest pain. Patient is aware and verbalizes understanding of plan of care.  Vitals:   12/15/22 1451  BP: 136/84  Pulse: 87  Resp: 18  Temp: 98.2 F (36.8 C)  SpO2: 98%

## 2022-12-15 NOTE — Discharge Instructions (Signed)
Go directly to the ER for further evaluation ?

## 2022-12-15 NOTE — ED Triage Notes (Signed)
Pt reports for past 3 weeks having "pings" in right side of chest. Reports hx afib. Reports "pings" are intermittent.

## 2022-12-15 NOTE — ED Triage Notes (Signed)
Patient arrives ambulatory by POV c/o feeling fatigued and having small stabs of pain in chest along with shortness of breath intermittently x 3 weeks. On eliquis and amiodarone for a-fib.

## 2022-12-15 NOTE — ED Provider Notes (Signed)
[facility]    CSN: [id number] Arrival date & time: 12/15/22  1443      History   Chief Complaint Chief Complaint  Patient presents with   Chest Pain    HPI Terry Koch is a 77 y.o. female.   Patient presents with intermittent chest discomfort x 3 weeks. Patient also endorses mild shortness of breath and fatigue. History of A-fib.  Patient reports last night the chest discomfort was more severe and she had worsening shortness of breath and fatigue.   Chest Pain Associated symptoms: fatigue and shortness of breath   Associated symptoms: no abdominal pain, no back pain, no dizziness, no headache, no nausea, no vomiting and no weakness     Past Medical History:  Diagnosis Date   Arthritis    self report   Bone fracture    Right Foot   H/O hernia repair    History of hysterectomy    History of mammogram    Dr.Elizabeth Pascal Lux   History of MRI    Dr.Elizabeth Pascal Lux.   History of removal of skin mole 03/09/1997   Hyperlipidemia    Hypertension    Scalp hematoma    Wears glasses     Patient Active Problem List   Diagnosis Date Noted   Arthritis    Hyperlipidemia    Hypertension    Persistent atrial fibrillation [facility])     Past Surgical History:  Procedure Laterality Date   ABDOMINAL HYSTERECTOMY     BREAST BIOPSY  03/09/1997   Dr.Finder   BREAST BIOPSY Right 11/07/2019   FIBROADENOMA WITH CALCIFICATIONS   BREAST BIOPSY Left 11/07/2019   FIBROADENOMA WITH CALCIFICATIONS   CARDIOVERSION N/A 12/25/2019   Procedure: CARDIOVERSION;  Surgeon: Yates Decamp, MD;  Location: [facility] ENDOSCOPY;  Service: Cardiovascular;  Laterality: N/A;   CARDIOVERSION N/A 02/06/2020   Procedure: CARDIOVERSION;  Surgeon: Yates Decamp, MD;  Location: [facility] ENDOSCOPY;  Service: Cardiovascular;  Laterality: N/A;   CARDIOVERSION N/A 06/10/2022   Procedure: CARDIOVERSION;  Surgeon: Tessa Lerner, DO;  Location: [facility] ENDOSCOPY;  Service: Cardiovascular;  Laterality: N/A;    CHOLECYSTECTOMY     ~ 6 yrs ago   COLONOSCOPY  2009   Dr.Chesley   CYSTECTOMY  09/04/2019   Dr.Gray   GALLBLADDER SURGERY  03/10/2015   Dr.Amid ( [place] )   POLYPECTOMY     12 polyps removed at [facility]    VENTRAL HERNIA REPAIR  2016    OB History   No obstetric history on file.      Home Medications    Prior to Admission medications   Medication Sig Start Date End Date Taking? Authorizing Provider  acetaminophen (TYLENOL) 650 MG CR tablet Take 650 mg by mouth in the morning and at bedtime.    [provider]  amiodarone (PACERONE) 200 MG tablet TAKE 1/2 TABLET BY MOUTH DAILY 10/19/22   Yates Decamp, MD  Ascorbic Acid (VITAMIN C) 1000 MG tablet Take 1,000 mg by mouth in the morning.    [provider]  Cholecalciferol (VITAMIN D3) 25 MCG (1000 UT) CAPS Take 1,000 Units by mouth in the morning.    [provider]  ELIQUIS 5 MG TABS tablet TAKE 1 TABLET(5 MG) BY MOUTH TWICE DAILY 06/29/22   Yates Decamp, MD  loratadine (CLARITIN) 10 MG tablet Take 10 mg by mouth daily.    [provider]  metoprolol succinate (TOPROL-XL) 50 MG 24 hr tablet Take 50 mg by mouth daily. Take with or immediately  following a meal.    [provider]  Misc Natural Products (ELDERBERRY IMMUNE COMPLEX) CHEW Chew 50 mg by mouth in the morning.    [provider]  Multiple Vitamins-Minerals (COMPLETE MULTIVITAMIN/MINERAL PO) Take 1 tablet by mouth in the morning. [employer] Vitamin    [provider]  NON FORMULARY Apply 1 application topically at bedtime. Veterinary liniment gel    [provider]  NON FORMULARY Apply 1 application topically as needed. Body balm with magnesium 12mg     [provider]  OMEGA-3 KRILL OIL PO Take 350 mg by mouth in the morning.    [provider]  Psyllium (METAMUCIL FIBER PO) Take 2 tablets by mouth at bedtime. Gummies    [provider]  rosuvastatin (CRESTOR) 10 MG tablet  TAKE 1 TABLET(10 MG) BY MOUTH DAILY 11/24/22   Sharon Seller, NP  spironolactone (ALDACTONE) 25 MG tablet TAKE 1 TABLET(25 MG) BY MOUTH EVERY MORNING 11/25/22   Yates Decamp, MD  valsartan-hydrochlorothiazide (DIOVAN-HCT) 320-25 MG tablet TAKE 1 TABLET BY MOUTH EVERY MORNING 11/24/21   Yates Decamp, MD  vitamin B-12 (CYANOCOBALAMIN) 1000 MCG tablet Take 1,000 mcg by mouth in the morning.    [provider]  Zinc 50 MG TABS Take 1 tablet by mouth daily.    [provider]    Family History Family History  Problem Relation Age of Onset   Breast cancer Mother    Alzheimer's disease Mother    Dementia Mother    Parkinson's disease Mother    Colon polyps Mother    Stroke Father    Dementia Father    Breast cancer Sister    Allergies Daughter    Allergies Daughter    Allergies Son    Colon cancer Neg Hx    Esophageal cancer Neg Hx    Stomach cancer Neg Hx    Rectal cancer Neg Hx     Social History Social History   Tobacco Use   Smoking status: Never   Smokeless tobacco: Never  Vaping Use   Vaping status: Never Used  Substance Use Topics   Alcohol use: Yes    Alcohol/week: 1.0 standard drink of alcohol    Types: 1 Glasses of wine per week    Comment: occasionally   Drug use: Never     Allergies   Amlodipine   Review of Systems Review of Systems  Constitutional:  Positive for fatigue.  Respiratory:  Positive for shortness of breath. Negative for chest tightness and wheezing.   Cardiovascular:  Positive for chest pain.  Gastrointestinal:  Negative for abdominal pain, diarrhea, nausea and vomiting.  Musculoskeletal:  Negative for back pain.  Neurological:  Negative for dizziness, weakness, light-headedness and headaches.     Physical Exam Triage Vital Signs ED Triage Vitals  Encounter Vitals Group     BP 12/15/22 1451 136/84     Systolic BP Percentile --      Diastolic BP Percentile --      Pulse Rate 12/15/22 1451 87     Resp 12/15/22 1451 18      Temp 12/15/22 1451 98.2 F (36.8 C)     Temp Source 12/15/22 1451 Oral     SpO2 12/15/22 1451 98 %     Weight --      Height --      Head Circumference --      Peak Flow --      Pain Score 12/15/22 1449 0  Pain Loc --      Pain Education --      Exclude from Growth Chart --    No data found.  Updated Vital Signs BP 136/84 (BP Location: Right Arm)   Pulse 87   Temp 98.2 F (36.8 C) (Oral)   Resp 18   SpO2 98%   Visual Acuity Right Eye Distance:   Left Eye Distance:   Bilateral Distance:    Right Eye Near:   Left Eye Near:    Bilateral Near:     Physical Exam Vitals and nursing note reviewed.  Constitutional:      General: She is awake. She is not in acute distress.    Appearance: She is well-developed. She is not ill-appearing, toxic-appearing or diaphoretic.  Cardiovascular:     Rate and Rhythm: Normal rate. Rhythm irregular.  Pulmonary:     Effort: Tachypnea present. No respiratory distress.     Breath sounds: Normal breath sounds. No stridor.  Musculoskeletal:     Right lower leg: No edema.     Left lower leg: No edema.  Skin:    General: Skin is warm and dry.  Neurological:     General: No focal deficit present.     Mental Status: She is alert and oriented to person, place, and time.     GCS: GCS eye subscore is 4. GCS verbal subscore is 5. GCS motor subscore is 6.  Psychiatric:        Behavior: Behavior is cooperative.      UC Treatments / Results  Labs (all labs ordered are listed, but only abnormal results are displayed) Labs Reviewed - No data to display  EKG   Radiology No results found.  Procedures Procedures (including critical care time)  Medications Ordered in UC Medications - No data to display  Initial Impression / Assessment and Plan / UC Course  I have reviewed the triage vital signs and the nursing notes.  Pertinent labs & imaging results that were available during my care of the patient were reviewed by me and  considered in my medical decision making (see chart for details).     Patient presented with 2-week history of chest discomfort, mild shortness of breath, and fatigue.  Reports last night chest discomfort was more severe with worsening shortness of breath and fatigue.  History of A-fib.  Upon assessment patient is mildly tachypneic and upon auscultation heart rhythm is irregular. EKG revealed A-fib.  Patient had cardioversion back in May and was normal sinus rhythm at that time.  Patient appears to be in no acute distress at this time.  Recommended patient go to ER for further evaluation and possible cardioversion. Patient agreeable to plan at this time.  Patient stable enough at this time to arrive to ER via POV. Final Clinical Impressions(s) / UC Diagnoses   Final diagnoses:  Shortness of breath  Atrial fibrillation, unspecified type [facility])     Discharge Instructions      Go directly to the ER for further evaluation.     ED Prescriptions   None    PDMP not reviewed this encounter.   Wynonia Lawman A, NP 12/15/22 1527

## 2022-12-15 NOTE — Telephone Encounter (Signed)
Spoke with patient and she went to Urgent care because she was experiencing SOB and  chest pain. EKG was done and they informed her she was in AFIB. They advised for her to go to ER for further work up.   She went home to get her husband but will go to ER.

## 2022-12-15 NOTE — Telephone Encounter (Signed)
Patient states that she was told at the hospital that she is in afib. They are recommending she be put in for procedure for cath, but she would like to have Dr. Jacinto Halim do this procedure. She would like to know if this is something she should wait for. Please advise.   States BP is normal

## 2022-12-16 ENCOUNTER — Telehealth (HOSPITAL_COMMUNITY): Payer: Self-pay

## 2022-12-16 DIAGNOSIS — I4891 Unspecified atrial fibrillation: Secondary | ICD-10-CM | POA: Diagnosis not present

## 2022-12-16 LAB — TROPONIN I (HIGH SENSITIVITY): Troponin I (High Sensitivity): 3 ng/L (ref <18)

## 2022-12-16 NOTE — Telephone Encounter (Signed)
Spoke with patient regarding ED follow up appointment. She declined appointment to be seen at the [facility]. Patient states she would rather see Dr. Jacinto Halim regarding her A-fib. She was told to reach out to his clinic to schedule ED follow up appointment. Communicated with patient and she verbalized understanding.

## 2022-12-16 NOTE — Discharge Instructions (Signed)
Please follow-up with Dr. Jacinto Halim in the office for further consideration of treatment of your atrial fibrillation.  If you develop worsening chest pain, weakness or shortness of breath, come back to the emergency department to be reevaluated.

## 2022-12-16 NOTE — ED Provider Notes (Signed)
[place] EMERGENCY DEPARTMENT AT [facility] Provider Note   CSN: [id number] Arrival date & time: 12/15/22  1639     History  Chief Complaint  Patient presents with   Chest Pain    Terry Koch is a 77 y.o. female.  Patient presents to the emergency department for evaluation of generalized weakness and chest pain.  Patient reports symptoms ongoing for 3 weeks.  Patient reports that the pain is a sharp pain that comes and goes and only last for very short period of time.  The weakness has been persistent.       Home Medications Prior to Admission medications   Medication Sig Start Date End Date Taking? Authorizing Provider  acetaminophen (TYLENOL) 650 MG CR tablet Take 650 mg by mouth in the morning and at bedtime.    [provider]  amiodarone (PACERONE) 200 MG tablet TAKE 1/2 TABLET BY MOUTH DAILY 10/19/22   Yates Decamp, MD  Ascorbic Acid (VITAMIN C) 1000 MG tablet Take 1,000 mg by mouth in the morning.    [provider]  Cholecalciferol (VITAMIN D3) 25 MCG (1000 UT) CAPS Take 1,000 Units by mouth in the morning.    [provider]  ELIQUIS 5 MG TABS tablet TAKE 1 TABLET(5 MG) BY MOUTH TWICE DAILY 06/29/22   Yates Decamp, MD  loratadine (CLARITIN) 10 MG tablet Take 10 mg by mouth daily.    [provider]  metoprolol succinate (TOPROL-XL) 50 MG 24 hr tablet Take 50 mg by mouth daily. Take with or immediately following a meal.    [provider]  Misc Natural Products (ELDERBERRY IMMUNE COMPLEX) CHEW Chew 50 mg by mouth in the morning.    [provider]  Multiple Vitamins-Minerals (COMPLETE MULTIVITAMIN/MINERAL PO) Take 1 tablet by mouth in the morning. [employer] Vitamin    [provider]  NON FORMULARY Apply 1 application topically at bedtime. Veterinary liniment gel    [provider]  NON FORMULARY Apply 1 application topically as needed. Body balm with magnesium 12mg     [provider]  OMEGA-3 KRILL OIL PO Take 350 mg by mouth in the morning.    [provider]  Psyllium (METAMUCIL FIBER PO) Take 2 tablets by mouth at bedtime. Gummies    [provider]  rosuvastatin (CRESTOR) 10 MG tablet TAKE 1 TABLET(10 MG) BY MOUTH DAILY 11/24/22   Sharon Seller, NP  spironolactone (ALDACTONE) 25 MG tablet TAKE 1 TABLET(25 MG) BY MOUTH EVERY MORNING 11/25/22   Yates Decamp, MD  valsartan-hydrochlorothiazide (DIOVAN-HCT) 320-25 MG tablet TAKE 1 TABLET BY MOUTH EVERY MORNING 11/24/21   Yates Decamp, MD  vitamin B-12 (CYANOCOBALAMIN) 1000 MCG tablet Take 1,000 mcg by mouth in the morning.    [provider]  Zinc 50 MG TABS Take 1 tablet by mouth daily.    [provider]      Allergies    Amlodipine    Review of Systems   Review of Systems  Physical Exam Updated Vital Signs BP 130/82 (BP Location: Right Arm)   Pulse 73   Temp 97.9 F (36.6 C)   Resp 18   Ht 5\' 7"  (1.702 m)   Wt 123.4 kg   SpO2 99%   BMI 42.60 kg/m  Physical Exam Vitals and nursing note reviewed.  Constitutional:      General: She is not in acute distress.    Appearance: She is well-developed.  HENT:     Head:  Normocephalic and atraumatic.     Mouth/Throat:     Mouth: Mucous membranes are moist.  Eyes:     General: Vision grossly intact. Gaze aligned appropriately.     Extraocular Movements: Extraocular movements intact.     Conjunctiva/sclera: Conjunctivae normal.  Cardiovascular:     Rate and Rhythm: Normal rate. Rhythm irregular.     Pulses: Normal pulses.     Heart sounds: Normal heart sounds, S1 normal and S2 normal. No murmur heard.    No friction rub. No gallop.  Pulmonary:     Effort: Pulmonary effort is normal. No respiratory distress.     Breath sounds: Normal breath sounds.  Abdominal:     General: Bowel sounds are normal.     Palpations: Abdomen is soft.     Tenderness: There is no abdominal tenderness. There is no guarding or  rebound.     Hernia: No hernia is present.  Musculoskeletal:        General: No swelling.     Cervical back: Full passive range of motion without pain, normal range of motion and neck supple. No spinous process tenderness or muscular tenderness. Normal range of motion.     Right lower leg: No edema.     Left lower leg: No edema.  Skin:    General: Skin is warm and dry.     Capillary Refill: Capillary refill takes less than 2 seconds.     Findings: No ecchymosis, erythema, rash or wound.  Neurological:     General: No focal deficit present.     Mental Status: She is alert and oriented to person, place, and time.     GCS: GCS eye subscore is 4. GCS verbal subscore is 5. GCS motor subscore is 6.     Cranial Nerves: Cranial nerves 2-12 are intact.     Sensory: Sensation is intact.     Motor: Motor function is intact.     Coordination: Coordination is intact.  Psychiatric:        Attention and Perception: Attention normal.        Mood and Affect: Mood normal.        Speech: Speech normal.        Behavior: Behavior normal.     ED Results / Procedures / Treatments   Labs (all labs ordered are listed, but only abnormal results are displayed) Labs Reviewed  BASIC METABOLIC PANEL - Abnormal; Notable for the following components:      Result Value   Creatinine, Ser 1.13 (*)    GFR, Estimated 50 (*)    All other components within normal limits  CBC  TROPONIN I (HIGH SENSITIVITY)  TROPONIN I (HIGH SENSITIVITY)    EKG EKG Interpretation Date/Time:  Tuesday December 15 2022 16:47:01 EDT Ventricular Rate:  83 PR Interval:    QRS Duration:  90 QT Interval:  360 QTC Calculation: 423 R Axis:   -22  Text Interpretation: Atrial fibrillation Septal infarct , age undetermined Abnormal ECG When compared with ECG of 15-Dec-2022 15:01,  afib is now present Confirmed by Gilda Crease [phone]) on 12/16/2022 1:47:03 AM  Radiology DG Chest 2 View  Result Date: 12/15/2022 CLINICAL  DATA:  Chest pain. EXAM: CHEST - 2 VIEW COMPARISON:  None Available. FINDINGS: Mild cardiomegaly. Aortic atherosclerosis. No focal airspace disease or pleural effusion. Slight vascular congestion without pulmonary edema. Thoracic spondylosis. IMPRESSION: Mild cardiomegaly and vascular congestion. Electronically Signed   By: Narda Rutherford M.D.   On: 12/15/2022 18:11  Procedures Procedures    Medications Ordered in ED Medications - No data to display  ED Course/ Medical Decision Making/ A&P                                 Medical Decision Making Amount and/or Complexity of Data Reviewed Labs: ordered. Radiology: ordered.   Patient presents to the emergency department for evaluation of generalized weakness over a 3-week time period.  She has had some intermittent chest pains that seem atypical for cardiac etiology.  Patient is in atrial fibrillation with rate control.  Patient reports that she has been in and out of atrial fibrillation since 2020.  She underwent cardioversion in April of this year.  She reports that was her third cardioversion.  No signs of acute coronary syndrome.  Patient is on Eliquis.  She is rate controlled currently and therefore does not need urgent intervention.  Discussed briefly with cardiology on-call.  Agree with discharge and office follow-up.        Final Clinical Impression(s) / ED Diagnoses Final diagnoses:  Atrial fibrillation, unspecified type (HCC)    Rx / DC Orders ED Discharge Orders          Ordered    Ambulatory referral to Cardiology       Comments: If you have not heard from the Cardiology office within the next 72 hours please call [phone].   12/16/22 0865              Gilda Crease, MD 12/16/22 [phone]

## 2022-12-19 ENCOUNTER — Other Ambulatory Visit: Payer: Self-pay | Admitting: Cardiology

## 2022-12-19 DIAGNOSIS — I10 Essential (primary) hypertension: Secondary | ICD-10-CM

## 2022-12-21 ENCOUNTER — Telehealth: Payer: Self-pay | Admitting: Cardiology

## 2022-12-21 NOTE — Telephone Encounter (Signed)
Pt states she would like to speak with the doctor about her recent hospital visit. Please advise

## 2022-12-21 NOTE — Telephone Encounter (Signed)
I spoke to her, reassured her, increased Amio to 200 mg daily from 100 mg daily, Please schedule her for a visit with me or extender and may need cardioversion if still in AF. She still feels tired a little

## 2022-12-30 NOTE — Telephone Encounter (Signed)
Pt was calling to f/u on her being contacted to schedule an appt to come into the office. Offered patient the soonest available appt and she declined and stated she'll wait to be contacted by a a nurse to let her know if she needs to get the cardioversion or not. Please advise

## 2022-12-30 NOTE — Telephone Encounter (Signed)
I spoke with patient and explained afib clinic.  Patient agreeable to seeing provider in afib clinic.  Appointment made for October 29 at 8:30 with Jorja Loa, PA

## 2023-01-05 ENCOUNTER — Ambulatory Visit (HOSPITAL_COMMUNITY)
Admission: RE | Admit: 2023-01-05 | Discharge: 2023-01-05 | Disposition: A | Discharge status: Home / Self Care | Payer: Medicare Other | Source: Ambulatory Visit | Attending: Physician Assistant | Admitting: Physician Assistant

## 2023-01-05 ENCOUNTER — Encounter (HOSPITAL_COMMUNITY): Payer: Self-pay | Admitting: Physician Assistant

## 2023-01-05 VITALS — BP 116/84 | HR 80 | Ht 67.0 in | Wt 271.4 lb

## 2023-01-05 DIAGNOSIS — D6869 Other thrombophilia: Secondary | ICD-10-CM | POA: Diagnosis not present

## 2023-01-05 DIAGNOSIS — Z5181 Encounter for therapeutic drug level monitoring: Secondary | ICD-10-CM

## 2023-01-05 DIAGNOSIS — E785 Hyperlipidemia, unspecified: Secondary | ICD-10-CM | POA: Insufficient documentation

## 2023-01-05 DIAGNOSIS — Z79899 Other long term (current) drug therapy: Secondary | ICD-10-CM | POA: Diagnosis not present

## 2023-01-05 DIAGNOSIS — I4819 Other persistent atrial fibrillation: Secondary | ICD-10-CM | POA: Insufficient documentation

## 2023-01-05 DIAGNOSIS — Z7901 Long term (current) use of anticoagulants: Secondary | ICD-10-CM | POA: Insufficient documentation

## 2023-01-05 DIAGNOSIS — Z6841 Body Mass Index (BMI) 40.0 and over, adult: Secondary | ICD-10-CM | POA: Insufficient documentation

## 2023-01-05 DIAGNOSIS — I10 Essential (primary) hypertension: Secondary | ICD-10-CM | POA: Insufficient documentation

## 2023-01-05 DIAGNOSIS — R0683 Snoring: Secondary | ICD-10-CM | POA: Insufficient documentation

## 2023-01-05 DIAGNOSIS — E669 Obesity, unspecified: Secondary | ICD-10-CM | POA: Insufficient documentation

## 2023-01-05 MED ORDER — AMIODARONE HCL 200 MG PO TABS: ORAL_TABLET | ORAL | 1 refills | Status: DC

## 2023-01-05 NOTE — Patient Instructions (Signed)
Increase amiodarone to 200mg  twice a day for 2 weeks then reduce to once a day

## 2023-01-05 NOTE — Progress Notes (Signed)
Primary Care Physician: Sharon Seller, NP Primary Cardiologist: Yates Decamp, MD Electrophysiologist: None  Referring Physician: [facility]   Terry Koch is a 77 y.o. female with a history of HTN, HLD, atrial fibrillation who presents for follow up in the [facility]. Patient underwent cardioversion 12/25/2019 was unsuccessful. Underwent repeat cardioversion with Amiodarone on board successfully to normal sinus rhythm 02/06/2020.  Recurrence of atrial fibrillation, patient underwent direct-current cardioversion on 06/10/2022. Patient is on Eliquis for a CHADS2VASC score of 4.  She presented to the urgent care with chest pain on 12/15/22. She was in rate controlled afib and sent to ED. Workup was reassuring and she was discharged in afib. Her amiodarone was increased from 100 mg to 200 mg on 12/21/22.  On follow up today, patient remains in rate controlled afib with symptoms of fatigue. She does admit to snoring.   Today, she denies symptoms of palpitations, chest pain, shortness of breath, orthopnea, PND, lower extremity edema, dizziness, presyncope, syncope, snoring, daytime somnolence, bleeding, or neurologic sequela. The patient is tolerating medications without difficulties and is otherwise without complaint today.    Atrial Fibrillation Risk Factors:  she does have symptoms or diagnosis of sleep apnea. she does not have a history of rheumatic fever. she does not have a history of alcohol use.   Atrial Fibrillation Management history:  Previous antiarrhythmic drugs: amiodarone  Previous cardioversions: 12/25/19, 02/06/20, 06/10/22,  Previous ablations: none Anticoagulation history: Eliquis  ROS- All systems are reviewed and negative except as per the HPI above.  Past Medical History:  Diagnosis Date   Arthritis    self report   Bone fracture    Right Foot   H/O hernia repair    History of hysterectomy    History of mammogram     Dr.Elizabeth Pascal Lux   History of MRI    Dr.Elizabeth Pascal Lux.   History of removal of skin mole 03/09/1997   Hyperlipidemia    Hypertension    Scalp hematoma    Wears glasses     Current Outpatient Medications  Medication Sig Dispense Refill   acetaminophen (TYLENOL) 650 MG CR tablet Take 650 mg by mouth in the morning and at bedtime.     amiodarone (PACERONE) 200 MG tablet TAKE 1/2 TABLET BY MOUTH DAILY (Patient taking differently: Take 200 mg by mouth daily.) 90 tablet 3   Ascorbic Acid (VITAMIN C) 1000 MG tablet Take 1,000 mg by mouth in the morning.     Cholecalciferol (VITAMIN D3) 25 MCG (1000 UT) CAPS Take 1,000 Units by mouth in the morning.     ELIQUIS 5 MG TABS tablet TAKE 1 TABLET(5 MG) BY MOUTH TWICE DAILY 60 tablet 0   loratadine (CLARITIN) 10 MG tablet Take 10 mg by mouth daily.     metoprolol succinate (TOPROL-XL) 50 MG 24 hr tablet Take 50 mg by mouth daily. Take with or immediately following a meal.     Misc Natural Products (ELDERBERRY IMMUNE COMPLEX) CHEW Chew 50 mg by mouth in the morning.     Multiple Vitamins-Minerals (COMPLETE MULTIVITAMIN/MINERAL PO) Take 1 tablet by mouth in the morning. [employer] Vitamin     NON FORMULARY Apply 1 application topically at bedtime. Veterinary liniment gel     NON FORMULARY Apply 1 application topically as needed. Body balm with magnesium 12mg      OMEGA-3 KRILL OIL PO Take 350 mg by mouth in the morning.     Psyllium (METAMUCIL FIBER PO)  Take 2 tablets by mouth at bedtime. Gummies     rosuvastatin (CRESTOR) 10 MG tablet TAKE 1 TABLET(10 MG) BY MOUTH DAILY 90 tablet 3   spironolactone (ALDACTONE) 25 MG tablet TAKE 1 TABLET(25 MG) BY MOUTH EVERY MORNING 90 tablet 3   valsartan-hydrochlorothiazide (DIOVAN-HCT) 320-25 MG tablet TAKE 1 TABLET BY MOUTH EVERY MORNING 90 tablet 3   vitamin B-12 (CYANOCOBALAMIN) 1000 MCG tablet Take 1,000 mcg by mouth in the morning.     Zinc 50 MG TABS Take 1 tablet by mouth daily.     No current  facility-administered medications for this encounter.    Physical Exam: BP 116/84   Pulse 80   Ht 5\' 7"  (1.702 m)   Wt 123.1 kg   BMI 42.51 kg/m   GEN: Well nourished, well developed in no acute distress NECK: No JVD; No carotid bruits CARDIAC: Irregularly irregular rate and rhythm, no murmurs, rubs, gallops RESPIRATORY:  Clear to auscultation without rales, wheezing or rhonchi  ABDOMEN: Soft, non-tender, non-distended EXTREMITIES:  No edema; No deformity   Wt Readings from Last 3 Encounters:  01/05/23 123.1 kg  12/15/22 123.4 kg  10/16/22 123.4 kg     EKG today demonstrates  Afib, NST Vent. rate 80 BPM PR interval * ms QRS duration 88 ms QT/QTcB 404/465 ms  Echo 10/25/19 demonstrated  Left ventricle cavity is normal in size. Mild concentric hypertrophy of  the left ventricle. Normal global wall motion. Normal LV systolic function  with EF 55%. Doppler evidence of grade II (pseudonormal) diastolic  dysfunction.  Left atrial cavity is severely dilated. Integrity of interatrial septum  could not be adequately assessed to limited visualization. Consider  alternate imaging stud, if clinically indicated.  Trileaflet aortic valve.  Trace aortic regurgitation.  Mild tricuspid regurgitation.  No evidence of pulmonary hypertension.    CHA2DS2-VASc Score = 4  The patient's score is based upon: CHF History: 0 HTN History: 1 Diabetes History: 0 Stroke History: 0 Vascular Disease History: 0 Age Score: 2 Gender Score: 1       ASSESSMENT AND PLAN: Persistent Atrial Fibrillation (ICD10:  I48.19) The patient's CHA2DS2-VASc score is 4, indicating a 4.8% annual risk of stroke.   Patient remains in rate controlled afib.  We discussed rhythm control options today. Will increase her amiodarone to 200 mg BID x 2 weeks then decrease back to once daily. Will arrange for DCCV if she does not chemically convert.  Continue Toprol 50 mg daily  Secondary Hypercoagulable State (ICD10:   D68.69) The patient is at significant risk for stroke/thromboembolism based upon her CHA2DS2-VASc Score of 4.  Continue Apixaban (Eliquis).   HTN Stable on current regimen  Obesity Body mass index is 42.51 kg/m.  Encouraged lifestyle modification  Snoring/suspected OSA The importance of adequate treatment of sleep apnea was discussed today in order to improve our ability to maintain sinus rhythm long term. Will refer for sleep study.    Follow up in the AF clinic in 2-3 weeks       Jorja Loa PA-C Afib Clinic [facility] [address] [place], [place] [address] [phone]

## 2023-01-21 ENCOUNTER — Ambulatory Visit: Payer: Self-pay | Admitting: Cardiology

## 2023-01-27 ENCOUNTER — Other Ambulatory Visit (HOSPITAL_COMMUNITY): Payer: Self-pay | Admitting: *Deleted

## 2023-01-27 ENCOUNTER — Encounter (HOSPITAL_COMMUNITY): Payer: Self-pay | Admitting: Physician Assistant

## 2023-01-27 ENCOUNTER — Ambulatory Visit (HOSPITAL_COMMUNITY)
Admission: RE | Admit: 2023-01-27 | Discharge: 2023-01-27 | Disposition: A | Discharge status: Home / Self Care | Payer: Medicare Other | Source: Ambulatory Visit | Attending: Physician Assistant | Admitting: Physician Assistant

## 2023-01-27 VITALS — BP 122/82 | HR 82 | Ht 67.0 in | Wt 273.0 lb

## 2023-01-27 DIAGNOSIS — E785 Hyperlipidemia, unspecified: Secondary | ICD-10-CM | POA: Insufficient documentation

## 2023-01-27 DIAGNOSIS — Z7901 Long term (current) use of anticoagulants: Secondary | ICD-10-CM | POA: Diagnosis not present

## 2023-01-27 DIAGNOSIS — I4819 Other persistent atrial fibrillation: Secondary | ICD-10-CM | POA: Insufficient documentation

## 2023-01-27 DIAGNOSIS — Z6841 Body Mass Index (BMI) 40.0 and over, adult: Secondary | ICD-10-CM | POA: Insufficient documentation

## 2023-01-27 DIAGNOSIS — R0683 Snoring: Secondary | ICD-10-CM | POA: Diagnosis not present

## 2023-01-27 DIAGNOSIS — D6869 Other thrombophilia: Secondary | ICD-10-CM | POA: Diagnosis not present

## 2023-01-27 DIAGNOSIS — E66813 Obesity, class 3: Secondary | ICD-10-CM

## 2023-01-27 DIAGNOSIS — E669 Obesity, unspecified: Secondary | ICD-10-CM | POA: Diagnosis not present

## 2023-01-27 DIAGNOSIS — Z79899 Other long term (current) drug therapy: Secondary | ICD-10-CM | POA: Insufficient documentation

## 2023-01-27 DIAGNOSIS — Z5181 Encounter for therapeutic drug level monitoring: Secondary | ICD-10-CM | POA: Diagnosis not present

## 2023-01-27 DIAGNOSIS — I10 Essential (primary) hypertension: Secondary | ICD-10-CM | POA: Diagnosis not present

## 2023-01-27 LAB — BASIC METABOLIC PANEL
Anion gap: 8 (ref 5–15)
BUN: 17 mg/dL (ref 8–23)
CO2: 27 mmol/L (ref 22–32)
Calcium: 9.4 mg/dL (ref 8.9–10.3)
Chloride: 105 mmol/L (ref 98–111)
Creatinine, Ser: 1.12 mg/dL — ABNORMAL HIGH (ref 0.44–1.00)
GFR, Estimated: 51 mL/min — ABNORMAL LOW (ref >60)
Glucose, Bld: 97 mg/dL (ref 70–99)
Potassium: 4.1 mmol/L (ref 3.5–5.1)
Sodium: 140 mmol/L (ref 135–145)

## 2023-01-27 LAB — CBC
HCT: 40.6 % (ref 36.0–46.0)
Hemoglobin: 12.7 g/dL (ref 12.0–15.0)
MCH: 27.4 pg (ref 26.0–34.0)
MCHC: 31.3 g/dL (ref 30.0–36.0)
MCV: 87.7 fL (ref 80.0–100.0)
Platelets: 207 10*3/uL (ref 150–400)
RBC: 4.63 MIL/uL (ref 3.87–5.11)
RDW: 14.4 % (ref 11.5–15.5)
WBC: 4.5 10*3/uL (ref 4.0–10.5)
nRBC: 0 % (ref 0.0–0.2)

## 2023-01-27 MED ORDER — AMIODARONE HCL 200 MG PO TABS: ORAL_TABLET | ORAL | Status: DC

## 2023-01-27 NOTE — Progress Notes (Signed)
Primary Care Physician: Sharon Seller, NP Primary Cardiologist: Yates Decamp, MD Electrophysiologist: None  Referring Physician: [facility]   Terry Koch is a 77 y.o. female with a history of HTN, HLD, atrial fibrillation who presents for follow up in the [facility]. Patient underwent cardioversion 12/25/2019 was unsuccessful. Underwent repeat cardioversion with Amiodarone on board successfully to normal sinus rhythm 02/06/2020.  Recurrence of atrial fibrillation, patient underwent direct-current cardioversion on 06/10/2022. Patient is on Eliquis for a CHADS2VASC score of 4.  She presented to the urgent care with chest pain on 12/15/22. She was in rate controlled afib and sent to ED. Workup was reassuring and she was discharged in afib. Her amiodarone was increased from 100 mg to 200 mg on 12/21/22.  On follow up today, patient remains in rate controlled afib despite increased amiodarone. She has symptoms of fatigue, especially with exertion. No bleeding issues on anticoagulation. She is scheduled for a sleep study on 12/3.  Today, she denies symptoms of palpitations, chest pain, shortness of breath, orthopnea, PND, lower extremity edema, dizziness, presyncope, syncope, bleeding, or neurologic sequela. The patient is tolerating medications without difficulties and is otherwise without complaint today.    Atrial Fibrillation Risk Factors:  she does have symptoms or diagnosis of sleep apnea. she does not have a history of rheumatic fever. she does not have a history of alcohol use.   Atrial Fibrillation Management history:  Previous antiarrhythmic drugs: amiodarone  Previous cardioversions: 12/25/19, 02/06/20, 06/10/22,  Previous ablations: none Anticoagulation history: Eliquis  ROS- All systems are reviewed and negative except as per the HPI above.  Past Medical History:  Diagnosis Date   Arthritis    self report   Bone fracture    Right  Foot   H/O hernia repair    History of hysterectomy    History of mammogram    Dr.Elizabeth Pascal Lux   History of MRI    Dr.Elizabeth Pascal Lux.   History of removal of skin mole 03/09/1997   Hyperlipidemia    Hypertension    Scalp hematoma    Wears glasses     Current Outpatient Medications  Medication Sig Dispense Refill   acetaminophen (TYLENOL) 650 MG CR tablet Take 650 mg by mouth in the morning and at bedtime.     Ascorbic Acid (VITAMIN C) 1000 MG tablet Take 1,000 mg by mouth in the morning.     Cholecalciferol (VITAMIN D3) 25 MCG (1000 UT) CAPS Take 1,000 Units by mouth in the morning.     ELIQUIS 5 MG TABS tablet TAKE 1 TABLET(5 MG) BY MOUTH TWICE DAILY 60 tablet 0   loratadine (CLARITIN) 10 MG tablet Take 10 mg by mouth daily.     metoprolol succinate (TOPROL-XL) 50 MG 24 hr tablet Take 50 mg by mouth daily. Take with or immediately following a meal.     Misc Natural Products (ELDERBERRY IMMUNE COMPLEX) CHEW Chew 50 mg by mouth in the morning.     Multiple Vitamins-Minerals (COMPLETE MULTIVITAMIN/MINERAL PO) Take 1 tablet by mouth in the morning. [employer] Vitamin     NON FORMULARY Apply 1 application topically at bedtime. Veterinary liniment gel     NON FORMULARY Apply 1 application topically as needed. Body balm with magnesium 12mg      OMEGA-3 KRILL OIL PO Take 350 mg by mouth in the morning.     Psyllium (METAMUCIL FIBER PO) Take 2 tablets by mouth at bedtime. Gummies     rosuvastatin (  CRESTOR) 10 MG tablet TAKE 1 TABLET(10 MG) BY MOUTH DAILY 90 tablet 3   spironolactone (ALDACTONE) 25 MG tablet TAKE 1 TABLET(25 MG) BY MOUTH EVERY MORNING 90 tablet 3   valsartan-hydrochlorothiazide (DIOVAN-HCT) 320-25 MG tablet TAKE 1 TABLET BY MOUTH EVERY MORNING 90 tablet 3   vitamin B-12 (CYANOCOBALAMIN) 1000 MCG tablet Take 1,000 mcg by mouth in the morning.     Zinc 50 MG TABS Take 1 tablet by mouth daily.     amiodarone (PACERONE) 200 MG tablet Take 1 tablet by mouth twice a day until  02/02/23 then decrease to 200 mg Daily     No current facility-administered medications for this encounter.    Physical Exam: BP 122/82   Pulse 82   Ht 5\' 7"  (1.702 m)   Wt 123.8 kg   BMI 42.76 kg/m   GEN: Well nourished, well developed in no acute distress NECK: No JVD; No carotid bruits CARDIAC: Irregularly irregular rate and rhythm, no murmurs, rubs, gallops RESPIRATORY:  Clear to auscultation without rales, wheezing or rhonchi  ABDOMEN: Soft, non-tender, non-distended EXTREMITIES:  No edema; No deformity    Wt Readings from Last 3 Encounters:  01/27/23 123.8 kg  01/05/23 123.1 kg  12/15/22 123.4 kg     EKG today demonstrates  Afib Vent. rate 82 BPM PR interval * ms QRS duration 94 ms QT/QTcB 370/432 ms   Echo 10/25/19 demonstrated  Left ventricle cavity is normal in size. Mild concentric hypertrophy of  the left ventricle. Normal global wall motion. Normal LV systolic function  with EF 55%. Doppler evidence of grade II (pseudonormal) diastolic  dysfunction.  Left atrial cavity is severely dilated. Integrity of interatrial septum  could not be adequately assessed to limited visualization. Consider  alternate imaging stud, if clinically indicated.  Trileaflet aortic valve.  Trace aortic regurgitation.  Mild tricuspid regurgitation.  No evidence of pulmonary hypertension.    CHA2DS2-VASc Score = 4  The patient's score is based upon: CHF History: 0 HTN History: 1 Diabetes History: 0 Stroke History: 0 Vascular Disease History: 0 Age Score: 2 Gender Score: 1       ASSESSMENT AND PLAN: Persistent Atrial Fibrillation (ICD10:  I48.19) The patient's CHA2DS2-VASc score is 4, indicating a 4.8% annual risk of stroke.   We discussed rhythm control options again today. After discussing the risks and benefits, will arrange for DCCV.  Will decrease amiodarone back to 200 mg once daily at time of DCCV. Continue Toprol 50 mg daily Continue Eliquis 5 mg BID, she  denies any missed doses in the past 3 weeks.  Secondary Hypercoagulable State (ICD10:  D68.69) The patient is at significant risk for stroke/thromboembolism based upon her CHA2DS2-VASc Score of 4.  Continue Apixaban (Eliquis).   HTN Stable on current regimen  Obesity Body mass index is 42.76 kg/m.  Encouraged lifestyle modification Will refer to [facility].  Snoring/suspected OSA Referred for sleep evaluation    Follow up in the AF clinic post DCCV.      Informed Consent   Shared Decision Making/Informed Consent The risks (stroke, cardiac arrhythmias rarely resulting in the need for a temporary or permanent pacemaker, skin irritation or burns and complications associated with conscious sedation including aspiration, arrhythmia, respiratory failure and death), benefits (restoration of normal sinus rhythm) and alternatives of a direct current cardioversion were explained in detail to Ms. Kibler and she agrees to proceed.        Jorja Loa PA-C Afib Clinic [facility]  [address] [place], [place] [address] [phone]

## 2023-01-27 NOTE — H&P (View-Only) (Signed)
Primary Care Physician: Sharon Seller, NP Primary Cardiologist: Yates Decamp, MD Electrophysiologist: None  Referring Physician: [facility]   Terry Koch is a 77 y.o. female with a history of HTN, HLD, atrial fibrillation who presents for follow up in the [facility]. Patient underwent cardioversion 12/25/2019 was unsuccessful. Underwent repeat cardioversion with Amiodarone on board successfully to normal sinus rhythm 02/06/2020.  Recurrence of atrial fibrillation, patient underwent direct-current cardioversion on 06/10/2022. Patient is on Eliquis for a CHADS2VASC score of 4.  She presented to the urgent care with chest pain on 12/15/22. She was in rate controlled afib and sent to ED. Workup was reassuring and she was discharged in afib. Her amiodarone was increased from 100 mg to 200 mg on 12/21/22.  On follow up today, patient remains in rate controlled afib despite increased amiodarone. She has symptoms of fatigue, especially with exertion. No bleeding issues on anticoagulation. She is scheduled for a sleep study on 12/3.  Today, she denies symptoms of palpitations, chest pain, shortness of breath, orthopnea, PND, lower extremity edema, dizziness, presyncope, syncope, bleeding, or neurologic sequela. The patient is tolerating medications without difficulties and is otherwise without complaint today.    Atrial Fibrillation Risk Factors:  she does have symptoms or diagnosis of sleep apnea. she does not have a history of rheumatic fever. she does not have a history of alcohol use.   Atrial Fibrillation Management history:  Previous antiarrhythmic drugs: amiodarone  Previous cardioversions: 12/25/19, 02/06/20, 06/10/22,  Previous ablations: none Anticoagulation history: Eliquis  ROS- All systems are reviewed and negative except as per the HPI above.  Past Medical History:  Diagnosis Date   Arthritis    self report   Bone fracture    Right  Foot   H/O hernia repair    History of hysterectomy    History of mammogram    Dr.Elizabeth Pascal Lux   History of MRI    Dr.Elizabeth Pascal Lux.   History of removal of skin mole 03/09/1997   Hyperlipidemia    Hypertension    Scalp hematoma    Wears glasses     Current Outpatient Medications  Medication Sig Dispense Refill   acetaminophen (TYLENOL) 650 MG CR tablet Take 650 mg by mouth in the morning and at bedtime.     Ascorbic Acid (VITAMIN C) 1000 MG tablet Take 1,000 mg by mouth in the morning.     Cholecalciferol (VITAMIN D3) 25 MCG (1000 UT) CAPS Take 1,000 Units by mouth in the morning.     ELIQUIS 5 MG TABS tablet TAKE 1 TABLET(5 MG) BY MOUTH TWICE DAILY 60 tablet 0   loratadine (CLARITIN) 10 MG tablet Take 10 mg by mouth daily.     metoprolol succinate (TOPROL-XL) 50 MG 24 hr tablet Take 50 mg by mouth daily. Take with or immediately following a meal.     Misc Natural Products (ELDERBERRY IMMUNE COMPLEX) CHEW Chew 50 mg by mouth in the morning.     Multiple Vitamins-Minerals (COMPLETE MULTIVITAMIN/MINERAL PO) Take 1 tablet by mouth in the morning. [employer] Vitamin     NON FORMULARY Apply 1 application topically at bedtime. Veterinary liniment gel     NON FORMULARY Apply 1 application topically as needed. Body balm with magnesium 12mg      OMEGA-3 KRILL OIL PO Take 350 mg by mouth in the morning.     Psyllium (METAMUCIL FIBER PO) Take 2 tablets by mouth at bedtime. Gummies     rosuvastatin (  CRESTOR) 10 MG tablet TAKE 1 TABLET(10 MG) BY MOUTH DAILY 90 tablet 3   spironolactone (ALDACTONE) 25 MG tablet TAKE 1 TABLET(25 MG) BY MOUTH EVERY MORNING 90 tablet 3   valsartan-hydrochlorothiazide (DIOVAN-HCT) 320-25 MG tablet TAKE 1 TABLET BY MOUTH EVERY MORNING 90 tablet 3   vitamin B-12 (CYANOCOBALAMIN) 1000 MCG tablet Take 1,000 mcg by mouth in the morning.     Zinc 50 MG TABS Take 1 tablet by mouth daily.     amiodarone (PACERONE) 200 MG tablet Take 1 tablet by mouth twice a day until  02/02/23 then decrease to 200 mg Daily     No current facility-administered medications for this encounter.    Physical Exam: BP 122/82   Pulse 82   Ht 5\' 7"  (1.702 m)   Wt 123.8 kg   BMI 42.76 kg/m   GEN: Well nourished, well developed in no acute distress NECK: No JVD; No carotid bruits CARDIAC: Irregularly irregular rate and rhythm, no murmurs, rubs, gallops RESPIRATORY:  Clear to auscultation without rales, wheezing or rhonchi  ABDOMEN: Soft, non-tender, non-distended EXTREMITIES:  No edema; No deformity    Wt Readings from Last 3 Encounters:  01/27/23 123.8 kg  01/05/23 123.1 kg  12/15/22 123.4 kg     EKG today demonstrates  Afib Vent. rate 82 BPM PR interval * ms QRS duration 94 ms QT/QTcB 370/432 ms   Echo 10/25/19 demonstrated  Left ventricle cavity is normal in size. Mild concentric hypertrophy of  the left ventricle. Normal global wall motion. Normal LV systolic function  with EF 55%. Doppler evidence of grade II (pseudonormal) diastolic  dysfunction.  Left atrial cavity is severely dilated. Integrity of interatrial septum  could not be adequately assessed to limited visualization. Consider  alternate imaging stud, if clinically indicated.  Trileaflet aortic valve.  Trace aortic regurgitation.  Mild tricuspid regurgitation.  No evidence of pulmonary hypertension.    CHA2DS2-VASc Score = 4  The patient's score is based upon: CHF History: 0 HTN History: 1 Diabetes History: 0 Stroke History: 0 Vascular Disease History: 0 Age Score: 2 Gender Score: 1       ASSESSMENT AND PLAN: Persistent Atrial Fibrillation (ICD10:  I48.19) The patient's CHA2DS2-VASc score is 4, indicating a 4.8% annual risk of stroke.   We discussed rhythm control options again today. After discussing the risks and benefits, will arrange for DCCV.  Will decrease amiodarone back to 200 mg once daily at time of DCCV. Continue Toprol 50 mg daily Continue Eliquis 5 mg BID, she  denies any missed doses in the past 3 weeks.  Secondary Hypercoagulable State (ICD10:  D68.69) The patient is at significant risk for stroke/thromboembolism based upon her CHA2DS2-VASc Score of 4.  Continue Apixaban (Eliquis).   HTN Stable on current regimen  Obesity Body mass index is 42.76 kg/m.  Encouraged lifestyle modification Will refer to [facility].  Snoring/suspected OSA Referred for sleep evaluation    Follow up in the AF clinic post DCCV.      Informed Consent   Shared Decision Making/Informed Consent The risks (stroke, cardiac arrhythmias rarely resulting in the need for a temporary or permanent pacemaker, skin irritation or burns and complications associated with conscious sedation including aspiration, arrhythmia, respiratory failure and death), benefits (restoration of normal sinus rhythm) and alternatives of a direct current cardioversion were explained in detail to Ms. Underwood and she agrees to proceed.        Jorja Loa PA-C Afib Clinic [facility]  [address] [place], [place] [address] [phone]

## 2023-01-27 NOTE — Patient Instructions (Signed)
Great to see you today!!!  Labs done today, your results will be available in MyChart, we will contact you for abnormal readings.  You have been referred to [facility], they will call you for an appointment  Your physician has recommended that you have a Cardioversion (DCCV). Electrical Cardioversion uses a jolt of electricity to your heart either through paddles or wired patches attached to your chest. This is a controlled, usually prescheduled, procedure. Defibrillation is done under light anesthesia in the hospital, and you usually go home the day of the procedure. This is done to get your heart back into a normal rhythm. You are not awake for the procedure. Please see the instruction below.  Starting Monday 11/26 DECREASE Amiodarone to 200 mg Daily  Your physician recommends that you schedule a follow-up appointment in: 2 weeks after cardioversion   CARDIOVERSION INSTRUCTIONS:   You are scheduled for a Cardioversion on Monday, November 26 with Dr. Bjorn Pippin.  Please arrive at the [facility] (Main Entrance A) at [facility]: [address] [place], [place] [address] at 10:00 AM (This time is 1 hour(s) before your procedure to ensure your preparation).   Free valet parking service is available. You will check in at ADMITTING.   *Please Note: You will receive a call the day before your procedure to confirm the appointment time. That time may have changed from the original time based on the schedule for that day.*   DIET:  Nothing to eat or drink after midnight except a sip of water with medications (see medication instructions below)  MEDICATION INSTRUCTIONS: !!IF ANY NEW MEDICATIONS ARE STARTED AFTER TODAY, PLEASE NOTIFY YOUR PROVIDER AS SOON AS POSSIBLE!!  FYI: Medications such as Semaglutide (Ozempic, [place]), Tirzepatide (Mounjaro, Zepbound), Dulaglutide (Trulicity), etc ("GLP1 agonists") AND Canagliflozin (Invokana), Dapagliflozin (Farxiga), Empagliflozin  (Jardiance), Ertugliflozin (Steglatro), Bexagliflozin [employer]) or any combination with one of these drugs such as Invokamet (Canagliflozin/Metformin), Synjardy (Empagliflozin/Metformin), etc ("SGLT2 inhibitors") must be held around the time of a procedure. This is not a comprehensive list of all of these drugs. Please review all of your medications and talk to your provider if you take any one of these. If you are not sure, ask your provider.        :1}Continue taking your anticoagulant (blood thinner): Apixaban (Eliquis).  You will need to continue this after your procedure until you are told by your provider that it is safe to stop.    LABS: Done today   FYI:  For your safety, and to allow [place] to monitor your vital signs accurately during the surgery/procedure we request: If you have artificial nails, gel coating, SNS etc, please have those removed prior to your surgery/procedure. Not having the nail coverings /polish removed may result in cancellation or delay of your surgery/procedure.  Your support person will be asked to wait in the waiting room during your procedure.  It is OK to have someone drop you off and come back when you are ready to be discharged.  You cannot drive after the procedure and will need someone to drive you home.  Bring your insurance cards.  *Special Note: Every effort is made to have your procedure done on time. Occasionally there are emergencies that occur at the hospital that may cause delays. Please be patient if a delay does occur.

## 2023-01-29 NOTE — Progress Notes (Signed)
Pt called for pre procedure instructions. Arrival time 0845 NPO after midnight explained Instructed to take am meds with sip of water and confirmed blood thinner consistency Instructed pt need for ride home tomorrow and have responsible adult with them for 24 hrs post procedure.

## 2023-01-31 NOTE — Anesthesia Preprocedure Evaluation (Signed)
Anesthesia Evaluation  Patient identified by MRN, date of birth, ID band Patient awake    Reviewed: Allergy & Precautions, NPO status , Patient's Chart, lab work & pertinent test results, reviewed documented beta blocker date and time   Airway Mallampati: III  TM Distance: >3 FB     Dental  (+) Missing, Dental Advisory Given   Pulmonary neg pulmonary ROS   Pulmonary exam normal breath sounds clear to auscultation       Cardiovascular hypertension, Pt. on medications and Pt. on home beta blockers + dysrhythmias (eliquis, no missed doses) Atrial Fibrillation  Rhythm:Irregular Rate:Normal  Echo 10/25/19 Left ventricle cavity is normal in size. Mild concentric hypertrophy of  the left ventricle. Normal global wall motion. Normal LV systolic function  with EF 55%. Doppler evidence of grade II (pseudonormal) diastolic  dysfunction.  Left atrial cavity is severely dilated. Integrity of interatrial septum  could not be adequately assessed to limited visualization. Consider  alternate imaging stud, if clinically indicated.  Trileaflet aortic valve.  Trace aortic regurgitation.  Mild tricuspid regurgitation.  No evidence of pulmonary hypertension.    Neuro/Psych negative neurological ROS  negative psych ROS   GI/Hepatic negative GI ROS, Neg liver ROS,,,  Endo/Other    Class 3 obesity (BMI 43)  Renal/GU negative Renal ROS  negative genitourinary   Musculoskeletal negative musculoskeletal ROS (+)    Abdominal  (+) + obese  Peds  Hematology negative hematology ROS (+)   Anesthesia Other Findings   Reproductive/Obstetrics                              Anesthesia Physical Anesthesia Plan  ASA: 3  Anesthesia Plan: General   Post-op Pain Management:    Induction: Intravenous  PONV Risk Score and Plan: 3 and Propofol infusion and Treatment may vary due to age or medical condition  Airway  Management Planned: Natural Airway  Additional Equipment:   Intra-op Plan:   Post-operative Plan:   Informed Consent: I have reviewed the patients History and Physical, chart, labs and discussed the procedure including the risks, benefits and alternatives for the proposed anesthesia with the patient or authorized representative who has indicated his/her understanding and acceptance.     Dental advisory given  Plan Discussed with: CRNA and Anesthesiologist  Anesthesia Plan Comments:         Anesthesia Quick Evaluation

## 2023-02-01 ENCOUNTER — Encounter (HOSPITAL_COMMUNITY): Payer: Self-pay | Admitting: Cardiology

## 2023-02-01 ENCOUNTER — Ambulatory Visit (HOSPITAL_COMMUNITY)
Admission: RE | Admit: 2023-02-01 | Discharge: 2023-02-01 | Disposition: A | Discharge status: Home / Self Care | Payer: Medicare Other | Attending: Cardiology | Admitting: Cardiology

## 2023-02-01 ENCOUNTER — Ambulatory Visit (HOSPITAL_COMMUNITY): Payer: Self-pay | Admitting: Anesthesiology

## 2023-02-01 ENCOUNTER — Ambulatory Visit (HOSPITAL_COMMUNITY): Payer: Medicare Other | Admitting: Anesthesiology

## 2023-02-01 ENCOUNTER — Other Ambulatory Visit (HOSPITAL_COMMUNITY): Payer: Self-pay

## 2023-02-01 ENCOUNTER — Other Ambulatory Visit: Payer: Self-pay

## 2023-02-01 ENCOUNTER — Encounter (HOSPITAL_COMMUNITY)
Admission: RE | Disposition: A | Discharge status: Home / Self Care | Payer: Self-pay | Source: Home / Self Care | Attending: Cardiology

## 2023-02-01 DIAGNOSIS — D6869 Other thrombophilia: Secondary | ICD-10-CM | POA: Diagnosis not present

## 2023-02-01 DIAGNOSIS — Z7901 Long term (current) use of anticoagulants: Secondary | ICD-10-CM | POA: Diagnosis not present

## 2023-02-01 DIAGNOSIS — R0683 Snoring: Secondary | ICD-10-CM | POA: Insufficient documentation

## 2023-02-01 DIAGNOSIS — I4891 Unspecified atrial fibrillation: Secondary | ICD-10-CM | POA: Diagnosis not present

## 2023-02-01 DIAGNOSIS — Z79899 Other long term (current) drug therapy: Secondary | ICD-10-CM | POA: Diagnosis not present

## 2023-02-01 DIAGNOSIS — I4819 Other persistent atrial fibrillation: Secondary | ICD-10-CM

## 2023-02-01 DIAGNOSIS — Z6841 Body Mass Index (BMI) 40.0 and over, adult: Secondary | ICD-10-CM | POA: Diagnosis not present

## 2023-02-01 DIAGNOSIS — E669 Obesity, unspecified: Secondary | ICD-10-CM | POA: Insufficient documentation

## 2023-02-01 DIAGNOSIS — I10 Essential (primary) hypertension: Secondary | ICD-10-CM | POA: Diagnosis not present

## 2023-02-01 HISTORY — PX: CARDIOVERSION: EP1203

## 2023-02-01 SURGERY — CARDIOVERSION (CATH LAB)
Anesthesia: General

## 2023-02-01 MED ORDER — PROPOFOL 10 MG/ML IV BOLUS
INTRAVENOUS | Status: DC | PRN
  Administered 2023-02-01: 50 mg via INTRAVENOUS

## 2023-02-01 MED ORDER — METOPROLOL SUCCINATE ER 25 MG PO TB24
25.0000 mg | ORAL_TABLET | Freq: Every morning | ORAL | 3 refills | Status: DC
  Filled 2023-02-01: qty 90, 90d supply, fill #0

## 2023-02-01 MED ORDER — SODIUM CHLORIDE 0.9% FLUSH
10.0000 mL | Freq: Two times a day (BID) | INTRAVENOUS | Status: DC
Start: 2023-02-01 — End: 2023-02-01

## 2023-02-01 SURGICAL SUPPLY — 1 items: PAD DEFIB RADIO PHYSIO CONN (PAD) ×1 IMPLANT

## 2023-02-01 NOTE — CV Procedure (Addendum)
Procedure:   DCCV  Indication:  Symptomatic atrial fibrillation  Procedure Note:  The patient signed informed consent.  They have had had therapeutic anticoagulation with Eliquis greater than 3 weeks.  Anesthesia was administered by Dr. Malen Gauze and Georgianne Fick, CRNA.  Adequate airway was maintained throughout and vital followed per protocol.  They were cardioverted x 3 with 200J of biphasic synchronized energy.  They converted to NSR.  After first shock at 200J, remained in Afib.  For second shock at 300J, pressure was applied to anterior pad but remained in Afib after shock.  For third shock at 360J, anterior pad was repositioned and pressure applied, and patient converted to sinus bradycardia with rate 40s.  Given bradycardia, will reduce her toprol XL dose to 25 mg daily.  She is also decreasing her amiodarone dose to 200 mg once daily starting tomorrow.   There were no apparent complications.  The patient had normal neuro status and respiratory status post procedure with vitals stable as recorded elsewhere.    Follow up:  They will continue on current medical therapy and follow up with cardiology as scheduled.  Epifanio Lesches, MD 02/01/2023 10:53 AM

## 2023-02-01 NOTE — Discharge Instructions (Signed)
Electrical Cardioversion °Electrical cardioversion is the delivery of a jolt of electricity to restore a normal rhythm to the heart. A rhythm that is too fast or is not regular keeps the heart from pumping well. In this procedure, sticky patches or metal paddles are placed on the chest to deliver electricity to the heart from a device. °This procedure may be done in an emergency if: °There is low or no blood pressure as a result of the heart rhythm. °Normal rhythm must be restored as fast as possible to protect the brain and heart from further damage. °It may save a life. °This may also be a scheduled procedure for irregular or fast heart rhythms that are not immediately life-threatening. ° °What can I expect after the procedure? °Your blood pressure, heart rate, breathing rate, and blood oxygen level will be monitored until you leave the hospital or clinic. °Your heart rhythm will be watched to make sure it does not change. °You may have some redness on the skin where the shocks were given. Over the counter cortizone cream may be helpful.  °Follow these instructions at home: °Do not drive for 24 hours if you were given a sedative during your procedure. °Take over-the-counter and prescription medicines only as told by your health care provider. °Ask your health care provider how to check your pulse. Check it often. °Rest for 48 hours after the procedure or as told by your health care provider. °Avoid or limit your caffeine use as told by your health care provider. °Keep all follow-up visits as told by your health care provider. This is important. °Contact a health care provider if: °You feel like your heart is beating too quickly or your pulse is not regular. °You have a serious muscle cramp that does not go away. °Get help right away if: °You have discomfort in your chest. °You are dizzy or you feel faint. °You have trouble breathing or you are short of breath. °Your speech is slurred. °You have trouble moving an  arm or leg on one side of your body. °Your fingers or toes turn cold or blue. °Summary °Electrical cardioversion is the delivery of a jolt of electricity to restore a normal rhythm to the heart. °This procedure may be done right away in an emergency or may be a scheduled procedure if the condition is not an emergency. °Generally, this is a safe procedure. °After the procedure, check your pulse often as told by your health care provider. °This information is not intended to replace advice given to you by your health care provider. Make sure you discuss any questions you have with your health care provider. °Document Revised: 09/26/2018 Document Reviewed: 09/26/2018 °Elsevier Patient Education © 2020 [employer]  °

## 2023-02-01 NOTE — Interval H&P Note (Signed)
History and Physical Interval Note:  02/01/2023 10:52 AM  Terry Koch  has presented today for surgery, with the diagnosis of afib.  The various methods of treatment have been discussed with the patient and family. After consideration of risks, benefits and other options for treatment, the patient has consented to  Procedure(s): CARDIOVERSION (CATH LAB) (N/A) as a surgical intervention.  The patient's history has been reviewed, patient examined, no change in status, stable for surgery.  I have reviewed the patient's chart and labs.  Questions were answered to the patient's satisfaction.     Little Ishikawa

## 2023-02-01 NOTE — Anesthesia Postprocedure Evaluation (Signed)
Anesthesia Post Note  Patient: Terry Koch  Procedure(s) Performed: CARDIOVERSION (CATH LAB)     Patient location during evaluation: PACU Anesthesia Type: General Level of consciousness: awake and alert and oriented Pain management: pain level controlled Vital Signs Assessment: post-procedure vital signs reviewed and stable Respiratory status: spontaneous breathing, nonlabored ventilation and respiratory function stable Cardiovascular status: blood pressure returned to baseline and stable Postop Assessment: no apparent nausea or vomiting Anesthetic complications: no   No notable events documented.  Last Vitals:  Vitals:   02/01/23 0855 02/01/23 1053  BP: 121/63 (!) 125/98  Pulse: 66 (!) 48  Resp: (!) 23 19  Temp: 36.8 C 36.7 C  SpO2: 98% 100%    Last Pain:  Vitals:   02/01/23 1053  TempSrc: Tympanic  PainSc: 0-No pain                 Pegge Cumberledge A.

## 2023-02-01 NOTE — Transfer of Care (Signed)
Immediate Anesthesia Transfer of Care Note  Patient: Terry Koch  Procedure(s) Performed: CARDIOVERSION (CATH LAB)  Patient Location: PACU  Anesthesia Type:General  Level of Consciousness: awake, alert , and oriented  Airway & Oxygen Therapy: Patient Spontanous Breathing and Patient connected to nasal cannula oxygen  Post-op Assessment: Report given to RN and Post -op Vital signs reviewed and stable  Post vital signs: Reviewed and stable  Last Vitals:  Vitals Value Taken Time  BP    Temp    Pulse    Resp    SpO2      Last Pain:  Vitals:   02/01/23 0855  TempSrc: Temporal         Complications: No notable events documented.

## 2023-02-15 ENCOUNTER — Encounter (HOSPITAL_COMMUNITY): Payer: Self-pay | Admitting: Physician Assistant

## 2023-02-15 ENCOUNTER — Ambulatory Visit (HOSPITAL_COMMUNITY)
Admission: RE | Admit: 2023-02-15 | Discharge: 2023-02-15 | Disposition: A | Discharge status: Home / Self Care | Payer: Medicare Other | Source: Ambulatory Visit | Attending: Physician Assistant | Admitting: Physician Assistant

## 2023-02-15 VITALS — BP 136/88 | HR 78 | Ht 67.0 in | Wt 273.2 lb

## 2023-02-15 DIAGNOSIS — I10 Essential (primary) hypertension: Secondary | ICD-10-CM | POA: Diagnosis not present

## 2023-02-15 DIAGNOSIS — Z7901 Long term (current) use of anticoagulants: Secondary | ICD-10-CM | POA: Diagnosis not present

## 2023-02-15 DIAGNOSIS — Z6841 Body Mass Index (BMI) 40.0 and over, adult: Secondary | ICD-10-CM | POA: Insufficient documentation

## 2023-02-15 DIAGNOSIS — R5383 Other fatigue: Secondary | ICD-10-CM | POA: Insufficient documentation

## 2023-02-15 DIAGNOSIS — Z79899 Other long term (current) drug therapy: Secondary | ICD-10-CM | POA: Insufficient documentation

## 2023-02-15 DIAGNOSIS — I4819 Other persistent atrial fibrillation: Secondary | ICD-10-CM | POA: Diagnosis not present

## 2023-02-15 DIAGNOSIS — D6869 Other thrombophilia: Secondary | ICD-10-CM | POA: Insufficient documentation

## 2023-02-15 DIAGNOSIS — E669 Obesity, unspecified: Secondary | ICD-10-CM | POA: Diagnosis not present

## 2023-02-15 DIAGNOSIS — E785 Hyperlipidemia, unspecified: Secondary | ICD-10-CM | POA: Insufficient documentation

## 2023-02-15 DIAGNOSIS — Z5181 Encounter for therapeutic drug level monitoring: Secondary | ICD-10-CM | POA: Diagnosis not present

## 2023-02-15 MED ORDER — AMIODARONE HCL 200 MG PO TABS: 200.0000 mg | ORAL_TABLET | Freq: Two times a day (BID) | ORAL | Status: DC

## 2023-02-15 NOTE — Progress Notes (Signed)
Primary Care Physician: Sharon Seller, NP Primary Cardiologist: Yates Decamp, MD Electrophysiologist: None  Referring Physician: [facility]   Terry Koch is a 77 y.o. female with a history of HTN, HLD, atrial fibrillation who presents for follow up in the [facility]. Patient underwent cardioversion 12/25/2019 was unsuccessful. Underwent repeat cardioversion with Amiodarone on board successfully to normal sinus rhythm 02/06/2020.  Recurrence of atrial fibrillation, patient underwent direct-current cardioversion on 06/10/2022. Patient is on Eliquis for a CHADS2VASC score of 4.  She presented to the urgent care with chest pain on 12/15/22. She was in rate controlled afib and sent to ED. Workup was reassuring and she was discharged in afib. Her amiodarone was increased from 100 mg to 200 mg on 12/21/22.  On follow up today, patient is s/p DCCV 02/01/23. Her BB was decreased due to bradycardia. She is back in rate controlled afib today. She has symptoms of fatigue and palpitations which started last week. She has a consult with sleep medicine today.   Today, she denies symptoms of chest pain, shortness of breath, orthopnea, PND, lower extremity edema, dizziness, presyncope, syncope, bleeding, or neurologic sequela. The patient is tolerating medications without difficulties and is otherwise without complaint today.    Atrial Fibrillation Risk Factors:  she does have symptoms or diagnosis of sleep apnea. she does not have a history of rheumatic fever. she does not have a history of alcohol use.   Atrial Fibrillation Management history:  Previous antiarrhythmic drugs: amiodarone  Previous cardioversions: 12/25/19, 02/06/20, 06/10/22, 02/01/23 Previous ablations: none Anticoagulation history: Eliquis  ROS- All systems are reviewed and negative except as per the HPI above.  Past Medical History:  Diagnosis Date   Arthritis    self report   Bone  fracture    Right Foot   H/O hernia repair    History of hysterectomy    History of mammogram    Dr.Elizabeth Pascal Lux   History of MRI    Dr.Elizabeth Pascal Lux.   History of removal of skin mole 03/09/1997   Hyperlipidemia    Hypertension    Scalp hematoma    Wears glasses     Current Outpatient Medications  Medication Sig Dispense Refill   acetaminophen (TYLENOL) 650 MG CR tablet Take 1,300 mg by mouth in the morning and at bedtime.     amiodarone (PACERONE) 200 MG tablet Take 1 tablet by mouth twice a day until 02/02/23 then decrease to 200 mg Daily     Ascorbic Acid (VITAMIN C) 1000 MG tablet Take 1,000 mg by mouth in the morning.     Cholecalciferol (VITAMIN D3) 25 MCG (1000 UT) CAPS Take 1,000 Units by mouth in the morning.     ELIQUIS 5 MG TABS tablet TAKE 1 TABLET(5 MG) BY MOUTH TWICE DAILY 60 tablet 0   GARLIC PO Take 1 capsule by mouth in the morning.     loratadine (CLARITIN) 10 MG tablet Take 10 mg by mouth daily.     Melatonin 10 MG TABS Take 10 mg by mouth at bedtime as needed (sleep).     metoprolol succinate (TOPROL-XL) 25 MG 24 hr tablet Take 1 tablet (25 mg total) by mouth in the morning. Take with or immediately following a meal. 90 tablet 3   Misc Natural Products (ELDERBERRY IMMUNE COMPLEX) CHEW Chew 50 mg by mouth in the morning.     Multiple Vitamins-Minerals (COMPLETE MULTIVITAMIN/MINERAL PO) Take 1 tablet by mouth in the morning. Century  Women's Vitamin     Multiple Vitamins-Minerals (ZINC PO) Take 1 tablet by mouth daily.     NON FORMULARY Apply 1 application topically at bedtime. Veterinary liniment gel     NON FORMULARY Apply 1 application topically as needed. Body balm with magnesium 12mg      OMEGA-3 KRILL OIL PO Take 350 mg by mouth in the morning.     rosuvastatin (CRESTOR) 10 MG tablet TAKE 1 TABLET(10 MG) BY MOUTH DAILY (Patient taking differently: Take 10 mg by mouth at bedtime.) 90 tablet 3   spironolactone (ALDACTONE) 25 MG tablet TAKE 1 TABLET(25 MG) BY  MOUTH EVERY MORNING 90 tablet 3   valsartan-hydrochlorothiazide (DIOVAN-HCT) 320-25 MG tablet TAKE 1 TABLET BY MOUTH EVERY MORNING 90 tablet 3   vitamin B-12 (CYANOCOBALAMIN) 1000 MCG tablet Take 1,000 mcg by mouth in the morning.     No current facility-administered medications for this encounter.    Physical Exam: BP 136/88   Pulse 78   Ht 5\' 7"  (1.702 m)   Wt 123.9 kg   BMI 42.79 kg/m   GEN: Well nourished, well developed in no acute distress NECK: No JVD; No carotid bruits CARDIAC: Irregularly irregular rate and rhythm, no murmurs, rubs, gallops RESPIRATORY:  Clear to auscultation without rales, wheezing or rhonchi  ABDOMEN: Soft, non-tender, non-distended EXTREMITIES:  No edema; No deformity    Wt Readings from Last 3 Encounters:  02/15/23 123.9 kg  01/27/23 123.8 kg  01/05/23 123.1 kg     EKG today demonstrates  Coarse afib Vent. rate 78 BPM PR interval * ms QRS duration 96 ms QT/QTcB 400/456 ms   Echo 10/25/19 demonstrated  Left ventricle cavity is normal in size. Mild concentric hypertrophy of  the left ventricle. Normal global wall motion. Normal LV systolic function  with EF 55%. Doppler evidence of grade II (pseudonormal) diastolic  dysfunction.  Left atrial cavity is severely dilated. Integrity of interatrial septum  could not be adequately assessed to limited visualization. Consider  alternate imaging stud, if clinically indicated.  Trileaflet aortic valve.  Trace aortic regurgitation.  Mild tricuspid regurgitation.  No evidence of pulmonary hypertension.    CHA2DS2-VASc Score = 4  The patient's score is based upon: CHF History: 0 HTN History: 1 Diabetes History: 0 Stroke History: 0 Vascular Disease History: 0 Age Score: 2 Gender Score: 1       ASSESSMENT AND PLAN: Persistent Atrial Fibrillation (ICD10:  I48.19) The patient's CHA2DS2-VASc score is 4, indicating a 4.8% annual risk of stroke.   S/p DCCV 02/01/23 with quick return of afib We  discussed rate vs rhythm control today. Would not favor a rate control strategy if possible given her fatigue symptoms. For rhythm control, can consider changing to dofetilide after a 3 month washout of amiodarone, increasing dose of amiodarone for several weeks and repeating DCCV, or afib ablation. Her elevated BMI is a disincentive for ablation. She would like to try DCCV one more time after longer on higher amiodarone dose. Will have her follow up with EP afterwards to establish care/discuss options. Increase amiodarone to 200 mg BID Continue Toprol 25 mg daily Continue Eliquis 5 mg BID  Secondary Hypercoagulable State (ICD10:  D68.69) The patient is at significant risk for stroke/thromboembolism based upon her CHA2DS2-VASc Score of 4.  Continue Apixaban (Eliquis).   HTN Stable on current regimen  Obesity Body mass index is 42.79 kg/m.  Encouraged lifestyle modification [facility].  Snoring/suspected OSA She is going for a  sleep consult today.    Follow up in the AF clinic in one month. She has a colonoscopy scheduled for 02/25/23. Will need to make sure repeat DCCV is at least 3 weeks after this.       Jorja Loa PA-C [facility] [address] [place], [place] [address] [phone]

## 2023-02-15 NOTE — Patient Instructions (Signed)
Increase amiodarone to 200mg  twice a day until follow up.

## 2023-02-25 ENCOUNTER — Ambulatory Visit: Payer: Medicare Other | Admitting: Gastroenterology

## 2023-02-25 ENCOUNTER — Encounter: Payer: Self-pay | Admitting: Gastroenterology

## 2023-02-25 VITALS — BP 116/64 | HR 77 | Ht 67.0 in | Wt 273.0 lb

## 2023-02-25 DIAGNOSIS — Z7901 Long term (current) use of anticoagulants: Secondary | ICD-10-CM | POA: Diagnosis not present

## 2023-02-25 DIAGNOSIS — K5900 Constipation, unspecified: Secondary | ICD-10-CM | POA: Diagnosis not present

## 2023-02-25 DIAGNOSIS — I4819 Other persistent atrial fibrillation: Secondary | ICD-10-CM

## 2023-02-25 DIAGNOSIS — Z860100 Personal history of colon polyps, unspecified: Secondary | ICD-10-CM

## 2023-02-25 NOTE — Patient Instructions (Signed)
If your blood pressure at your visit was 140/90 or greater, please contact your primary care physician to follow up on this. ______________________________________________________  If you are age 77 or older, your body mass index should be between 23-30. Your Body mass index is 42.76 kg/m. If this is out of the aforementioned range listed, please consider follow up with your Primary Care Provider.  If you are age 103 or younger, your body mass index should be between 19-25. Your Body mass index is 42.76 kg/m. If this is out of the aformentioned range listed, please consider follow up with your Primary Care Provider.  ________________________________________________________  The [facility] providers would like to encourage you to use [facility] to communicate with providers for non-urgent requests or questions.  Due to long hold times on the telephone, sending your provider a message by [facility] may be a faster and more efficient way to get a response.  Please allow 48 business hours for a response.  Please remember that this is for non-urgent requests.  _______________________________________________________  Due to recent changes in healthcare laws, you may see the results of your imaging and laboratory studies on MyChart before your provider has had a chance to review them.  We understand that in some cases there may be results that are confusing or concerning to you. Not all laboratory results come back in the same time frame and the provider may be waiting for multiple results in order to interpret others.  Please give [place] 48 hours in order for your provider to thoroughly review all the results before contacting the office for clarification of your results.   We will contact you in 6 months to see if you are in a better position to schedule your colonoscopy procedure.   Thank you for entrusting me with your care and for choosing [facility], Dr. Ileene Patrick

## 2023-02-25 NOTE — Progress Notes (Signed)
HPI :  77 year old female with a history of atrial fibrillation, anticoagulated on Eliquis, history of colon polyps, here to reestablish care to discuss surveillance colonoscopy.  I last saw her in July 2021.  Recall her last colonoscopy was in July 2021, she had 11 polyps removed, the majority were adenomatous and sessile serrated.  We had discussed doing a colonoscopy again in 3 years.  At the time of her exam she actually went into new onset A-fib and was evaluated quickly by the cardiology team.  This has been her main medical problem actually since have last seen her.  She has undergone 4 cardioversions for A-fib that has persistently bothered her, she last had a cardioversion on November 25 but this did not take for too long and after a week or 2 she was back in A-fib.  She follows closely with her cardiologist in the A-fib clinic they are considering ablation therapy, currently her on amiodarone at higher dosing to see if that can control her symptoms better.  She remains on Eliquis.  She denies any problems with her bowels.  She takes Metamucil gummy once daily and states if she takes that her bowels are fairly regular.  She usually does not have any blood in her stools.  If she has straining or constipation she sometimes see some scant blood.  No family history of colon cancer.    Nuclear stress test 10/18/19: Normal myocardial perfusion. Stress LVEF calculated 49%, although visually appears normal. Low risk study.  Echo 10/25/19: EF 55%, grade II DD   Colonoscopy 10/04/19: - One 3 mm polyp in the cecum, removed with a cold snare. Resected and retrieved. - One 3 mm polyp in the ascending colon, removed with a cold snare. Resected and retrieved. - One 7 mm polyp at the hepatic flexure, removed with a cold snare. Resected and retrieved. - Three 3 to 5 mm polyps in the transverse colon, removed with a cold snare. Resected and retrieved. - One 4 mm polyp in the descending colon, removed  with a cold snare. Resected and retrieved. - Three 4 to 5 mm polyps in the sigmoid colon, removed with a cold snare. Resected and retrieved. - One 4 mm polyp at the recto-sigmoid colon, removed with a cold snare. Resected and retrieved. - Anal papilla(e) were hypertrophied. - The examination was otherwise normal.  11 polyps total  Surgical [P], colon, sigmoid, transverse, cecal, ascending, hepatic flexure, rectosigmoid, polyp (11) - TUBULAR ADENOMA (FIVE). - NO HIGH GRADE DYSPLASIA OR CARCINOMA. - SESSILE SERRATED POLYP (FOUR) WITHOUT CYTOLOGIC DYSPLASIA. - HYPERPLASTIC POLYP (TWO).  Repeat exam in 3 years   Past Medical History:  Diagnosis Date   Arthritis    self report   Bone fracture    Right Foot   H/O hernia repair    History of hysterectomy    History of mammogram    Dr.Elizabeth Pascal Lux   History of MRI    Dr.Elizabeth Pascal Lux.   History of removal of skin mole 03/09/1997   Hyperlipidemia    Hypertension    Persistent atrial fibrillation (HCC)    Scalp hematoma    Wears glasses      Past Surgical History:  Procedure Laterality Date   ABDOMINAL HYSTERECTOMY     BREAST BIOPSY  03/09/1997   Dr.Finder   BREAST BIOPSY Right 11/07/2019   FIBROADENOMA WITH CALCIFICATIONS   BREAST BIOPSY Left 11/07/2019   FIBROADENOMA WITH CALCIFICATIONS   CARDIOVERSION N/A 12/25/2019   Procedure: CARDIOVERSION;  Surgeon:  Yates Decamp, MD;  Location: [facility] ENDOSCOPY;  Service: Cardiovascular;  Laterality: N/A;   CARDIOVERSION N/A 02/06/2020   Procedure: CARDIOVERSION;  Surgeon: Yates Decamp, MD;  Location: [facility] ENDOSCOPY;  Service: Cardiovascular;  Laterality: N/A;   CARDIOVERSION N/A 06/10/2022   Procedure: CARDIOVERSION;  Surgeon: Tessa Lerner, DO;  Location: [facility] ENDOSCOPY;  Service: Cardiovascular;  Laterality: N/A;   CARDIOVERSION N/A 02/01/2023   Procedure: CARDIOVERSION (CATH LAB);  Surgeon: Little Ishikawa, MD;  Location: [facility] INVASIVE CV LAB;  Service: Cardiovascular;  Laterality: N/A;    CHOLECYSTECTOMY     ~ 6 yrs ago   COLONOSCOPY  2009   Dr.Chesley   CYSTECTOMY  09/04/2019   Dr.Gray   GALLBLADDER SURGERY  03/10/2015   Dr.Amid ( [place] )   POLYPECTOMY     12 polyps removed at [facility]    VENTRAL HERNIA REPAIR  2016   Family History  Problem Relation Age of Onset   Breast cancer Mother    Alzheimer's disease Mother    Dementia Mother    Parkinson's disease Mother    Colon polyps Mother    Stroke Father    Dementia Father    Breast cancer Sister    Allergies Daughter    Allergies Daughter    Allergies Son    Colon cancer Neg Hx    Esophageal cancer Neg Hx    Stomach cancer Neg Hx    Rectal cancer Neg Hx    Social History   Tobacco Use   Smoking status: Never   Smokeless tobacco: Never   Tobacco comments:    Never smoked 01/05/23  Vaping Use   Vaping status: Never Used  Substance Use Topics   Alcohol use: Yes    Alcohol/week: 1.0 standard drink of alcohol    Types: 1 Glasses of wine per week    Comment: occasionally   Drug use: Never   Current Outpatient Medications  Medication Sig Dispense Refill   acetaminophen (TYLENOL) 650 MG CR tablet Take 1,300 mg by mouth in the morning and at bedtime.     amiodarone (PACERONE) 200 MG tablet Take 1 tablet (200 mg total) by mouth 2 (two) times daily.     Ascorbic Acid (VITAMIN C) 1000 MG tablet Take 1,000 mg by mouth in the morning.     Cholecalciferol (VITAMIN D3) 25 MCG (1000 UT) CAPS Take 1,000 Units by mouth in the morning.     ELIQUIS 5 MG TABS tablet TAKE 1 TABLET(5 MG) BY MOUTH TWICE DAILY 60 tablet 0   GARLIC PO Take 1 capsule by mouth in the morning.     loratadine (CLARITIN) 10 MG tablet Take 10 mg by mouth daily.     Melatonin 10 MG TABS Take 10 mg by mouth at bedtime as needed (sleep).     metoprolol succinate (TOPROL-XL) 25 MG 24 hr tablet Take 1 tablet (25 mg total) by mouth in the morning. Take with or immediately following a meal. 90 tablet 3   Misc Natural Products (ELDERBERRY  IMMUNE COMPLEX) CHEW Chew 50 mg by mouth in the morning.     Multiple Vitamins-Minerals (COMPLETE MULTIVITAMIN/MINERAL PO) Take 1 tablet by mouth in the morning. [employer] Vitamin     Multiple Vitamins-Minerals (ZINC PO) Take 1 tablet by mouth daily.     NON FORMULARY Apply 1 application topically at bedtime. Veterinary liniment gel     NON FORMULARY Apply 1 application topically as needed. Body balm with magnesium 12mg      OMEGA-3  KRILL OIL PO Take 350 mg by mouth in the morning.     rosuvastatin (CRESTOR) 10 MG tablet TAKE 1 TABLET(10 MG) BY MOUTH DAILY (Patient taking differently: Take 10 mg by mouth at bedtime.) 90 tablet 3   spironolactone (ALDACTONE) 25 MG tablet TAKE 1 TABLET(25 MG) BY MOUTH EVERY MORNING 90 tablet 3   valsartan-hydrochlorothiazide (DIOVAN-HCT) 320-25 MG tablet TAKE 1 TABLET BY MOUTH EVERY MORNING 90 tablet 3   vitamin B-12 (CYANOCOBALAMIN) 1000 MCG tablet Take 1,000 mcg by mouth in the morning.     No current facility-administered medications for this visit.   Allergies  Allergen Reactions   Amlodipine Other (See Comments)    Leg edema     Review of Systems: All systems reviewed and negative except where noted in HPI.   Lab Results  Component Value Date   WBC 4.5 01/27/2023   HGB 12.7 01/27/2023   HCT 40.6 01/27/2023   MCV 87.7 01/27/2023   PLT 207 01/27/2023    Lab Results  Component Value Date   NA 140 01/27/2023   CL 105 01/27/2023   K 4.1 01/27/2023   CO2 27 01/27/2023   BUN 17 01/27/2023   CREATININE 1.12 (H) 01/27/2023   GFRNONAA 51 (L) 01/27/2023   CALCIUM 9.4 01/27/2023   ALBUMIN 4.1 06/05/2020   GLUCOSE 97 01/27/2023    Lab Results  Component Value Date   ALT 23 10/16/2022   AST 27 10/16/2022   ALKPHOS 72 06/05/2020   BILITOT 0.5 10/16/2022     Physical Exam: BP 116/64   Pulse 77   Ht 5\' 7"  (1.702 m)   Wt 273 lb (123.8 kg)   SpO2 97%   BMI 42.76 kg/m  Constitutional: Pleasant,well-developed, female in no acute  distress. HEENT: Normocephalic and atraumatic. Conjunctivae are normal. No scleral icterus. Neck supple.  Cardiovascular: atrial fibrillation, normal rate Pulmonary/chest: Effort normal and breath sounds normal.  Abdominal: Soft, nondistended, nontender. There are no masses palpable. Extremities: no edema Lymphadenopathy: No cervical adenopathy noted. Neurological: Alert and oriented to person place and time. Skin: Skin is warm and dry. No rashes noted. Psychiatric: Normal mood and affect. Behavior is normal.   ASSESSMENT: 77 y.o. female here for assessment of the following  1. History of colon polyps   2. Persistent atrial fibrillation (HCC)   3. Anticoagulated   4. Constipation, unspecified constipation type    Patient is due for surveillance colonoscopy given her history of numerous polyps on the last exam over 3 years ago.  However, at her age and medical problems we discussed if she want to do another colonoscopy.  Her main issue currently is atrial fibrillation has had a hard time getting control of that.  I do not think it is a good time to proceed with colonoscopy given her recent issues with A-fib and would prefer that to be better controlled and stable over time before we give her anesthesia.  She is relieved to hear this and was thinking along the same lines and is agreeable to this.  She will follow-up with her cardiologist for further management of her A-fib and we will circle back with her in about 6 months to see if she may be ready at that time.  Once A-fib is better controlled and we can safely hold her Eliquis for a few days, we can then proceed with colonoscopy if she wishes to.  I do think at her age, given numerous polyps she has had removed in the past, she  may benefit from 1 further exam prior to stopping.  Otherwise continue Metamucil it is working well for her constipation.  She can contact me with any questions in the interim.  All questions answered.  She and her  husband agreed with the plan as outlined.  PLAN: - colonoscopy at some point but needs AF better controlled - chart check in 6 months, she can schedule once more stable and she is agreeable with that, would need eliquis held for 2 days prior to exam - continue metamucil  Harlin Rain, MD [facility]

## 2023-03-19 ENCOUNTER — Encounter (HOSPITAL_COMMUNITY): Payer: Self-pay | Admitting: Physician Assistant

## 2023-03-19 ENCOUNTER — Ambulatory Visit (HOSPITAL_COMMUNITY)
Admission: RE | Admit: 2023-03-19 | Discharge: 2023-03-19 | Disposition: A | Discharge status: Home / Self Care | Payer: Medicare Other | Source: Ambulatory Visit | Attending: Physician Assistant | Admitting: Physician Assistant

## 2023-03-19 VITALS — BP 114/84 | HR 80 | Ht 67.0 in | Wt 270.8 lb

## 2023-03-19 DIAGNOSIS — Z79899 Other long term (current) drug therapy: Secondary | ICD-10-CM | POA: Insufficient documentation

## 2023-03-19 DIAGNOSIS — Z7901 Long term (current) use of anticoagulants: Secondary | ICD-10-CM | POA: Diagnosis not present

## 2023-03-19 DIAGNOSIS — E669 Obesity, unspecified: Secondary | ICD-10-CM | POA: Diagnosis not present

## 2023-03-19 DIAGNOSIS — Z6841 Body Mass Index (BMI) 40.0 and over, adult: Secondary | ICD-10-CM | POA: Diagnosis not present

## 2023-03-19 DIAGNOSIS — Z5181 Encounter for therapeutic drug level monitoring: Secondary | ICD-10-CM | POA: Insufficient documentation

## 2023-03-19 DIAGNOSIS — I4819 Other persistent atrial fibrillation: Secondary | ICD-10-CM | POA: Insufficient documentation

## 2023-03-19 DIAGNOSIS — D6869 Other thrombophilia: Secondary | ICD-10-CM | POA: Diagnosis not present

## 2023-03-19 DIAGNOSIS — R5383 Other fatigue: Secondary | ICD-10-CM | POA: Insufficient documentation

## 2023-03-19 DIAGNOSIS — I10 Essential (primary) hypertension: Secondary | ICD-10-CM | POA: Insufficient documentation

## 2023-03-19 DIAGNOSIS — E785 Hyperlipidemia, unspecified: Secondary | ICD-10-CM | POA: Insufficient documentation

## 2023-03-19 LAB — COMPREHENSIVE METABOLIC PANEL
ALT: 32 U/L (ref 0–44)
AST: 36 U/L (ref 15–41)
Albumin: 3.8 g/dL (ref 3.5–5.0)
Alkaline Phosphatase: 49 U/L (ref 38–126)
Anion gap: 11 (ref 5–15)
BUN: 13 mg/dL (ref 8–23)
CO2: 23 mmol/L (ref 22–32)
Calcium: 9.3 mg/dL (ref 8.9–10.3)
Chloride: 105 mmol/L (ref 98–111)
Creatinine, Ser: 1.16 mg/dL — ABNORMAL HIGH (ref 0.44–1.00)
GFR, Estimated: 49 mL/min — ABNORMAL LOW (ref >60)
Glucose, Bld: 132 mg/dL — ABNORMAL HIGH (ref 70–99)
Potassium: 4.3 mmol/L (ref 3.5–5.1)
Sodium: 139 mmol/L (ref 135–145)
Total Bilirubin: 0.6 mg/dL (ref 0.0–1.2)
Total Protein: 7 g/dL (ref 6.5–8.1)

## 2023-03-19 LAB — CBC
HCT: 40.1 % (ref 36.0–46.0)
Hemoglobin: 12.3 g/dL (ref 12.0–15.0)
MCH: 27 pg (ref 26.0–34.0)
MCHC: 30.7 g/dL (ref 30.0–36.0)
MCV: 87.9 fL (ref 80.0–100.0)
Platelets: 238 10*3/uL (ref 150–400)
RBC: 4.56 MIL/uL (ref 3.87–5.11)
RDW: 14.4 % (ref 11.5–15.5)
WBC: 5.4 10*3/uL (ref 4.0–10.5)
nRBC: 0 % (ref 0.0–0.2)

## 2023-03-19 LAB — TSH: TSH: 1.344 u[IU]/mL (ref 0.350–4.500)

## 2023-03-19 MED ORDER — AMIODARONE HCL 200 MG PO TABS: 200.0000 mg | ORAL_TABLET | Freq: Every day | ORAL | 1 refills | Status: DC

## 2023-03-19 NOTE — Patient Instructions (Signed)
 On January 14th -- reduce Amiodarone  to 200mg  ONCE a day   Cardioversion scheduled for: Tuesday, January 14th   - Arrive at the [facility] and go to admitting at 9:30 AM   - Do not eat or drink anything after midnight the night prior to your procedure.   - Take all your morning medication (except diabetic medications) with a sip of water prior to arrival.  - You will not be able to drive home after your procedure.    - Do NOT miss any doses of your blood thinner - if you should miss a dose please notify our office immediately.   - If you feel as if you go back into normal rhythm prior to scheduled cardioversion, please notify our office immediately.   If your procedure is canceled in the cardioversion suite you will be charged a cancellation fee.    Beginning in 2025, the prescription drug law requires all Medicare prescription drug plans (Medicare Part D plans)--including both standalone Medicare prescription drug plans and Medicare Advantage plans with prescription drug coverage--to offer Part D enrollees the option to pay out-of-pocket prescription drug costs in the form of monthly payments instead of all at once at the pharmacy. This program, called the Medicare Prescription Payment Plan, will be helpful for people with high cost sharing earlier in the plan year by spreading out those expenses over the course of the plan year.  What is the Medicare Prescription Payment Plan? The Medicare Prescription Payment Plan is a new payment option created under the Inflation Reduction Act that requires Part D plan sponsors to provide their enrollees with the option to pay out-of-pocket prescription drug costs in the form of monthly payments over the course of the plan year instead of all at once to the pharmacy. The program begins on March 10, 2023.  Program participants will pay $0 to the pharmacy for covered Part D drugs, and Part D plan sponsors will then bill program  participants monthly for any cost sharing they incur while in the program. Pharmacies will be paid in full by the Part D sponsor in accordance with Part D prompt payment requirements  If there are issues with costs of your medications with new medicare changes ---call your DRUG INSURANCE to discuss payment plan to reduce your monthly costs.

## 2023-03-19 NOTE — H&P (View-Only) (Signed)
 Primary Care Physician: Caro Harlene POUR, NP Primary Cardiologist: Gordy Bergamo, MD Electrophysiologist: None  Referring Physician: [facility]   Deajah D Ashby is a 78 y.o. female with a history of HTN, HLD, atrial fibrillation who presents for follow up in the [facility]. Patient underwent cardioversion 12/25/2019 was unsuccessful. Underwent repeat cardioversion with Amiodarone  on board successfully to normal sinus rhythm 02/06/2020.  Recurrence of atrial fibrillation, patient underwent direct-current cardioversion on 06/10/2022. Patient is on Eliquis  for a CHADS2VASC score of 4.  She presented to the urgent care with chest pain on 12/15/22. She was in rate controlled afib and sent to ED. Workup was reassuring and she was discharged in afib. Her amiodarone  was increased from 100 mg to 200 mg on 12/21/22.  Patient is s/p DCCV 02/01/23 with quick return of afib. Her BB was decreased due to bradycardia. At her visit 02/15/23, her amiodarone  was increased to 200 mg BID in anticipation of another attempt at DCCV.   On follow up today, patient remains in rate controlled afib. She continues to have symptoms of fatigue. No bleeding issues on anticoagulation. She has seen sleep medicine and is pending a sleep study.   Today, she denies symptoms of palpitations, chest pain, shortness of breath, orthopnea, PND, lower extremity edema, dizziness, presyncope, syncope, bleeding, or neurologic sequela. The patient is tolerating medications without difficulties and is otherwise without complaint today.    Atrial Fibrillation Risk Factors:  she does have symptoms or diagnosis of sleep apnea. she does not have a history of rheumatic fever. she does not have a history of alcohol use.   Atrial Fibrillation Management history:  Previous antiarrhythmic drugs: amiodarone   Previous cardioversions: 12/25/19, 02/06/20, 06/10/22, 02/01/23 Previous ablations:  none Anticoagulation history: Eliquis   ROS- All systems are reviewed and negative except as per the HPI above.  Past Medical History:  Diagnosis Date   Arthritis    self report   Bone fracture    Right Foot   H/O hernia repair    History of hysterectomy    History of mammogram    Dr.Elizabeth Gari   History of MRI    Dr.Elizabeth Gari.   History of removal of skin mole 03/09/1997   Hyperlipidemia    Hypertension    Persistent atrial fibrillation (HCC)    Scalp hematoma    Wears glasses     Current Outpatient Medications  Medication Sig Dispense Refill   acetaminophen  (TYLENOL ) 650 MG CR tablet Take 1,300 mg by mouth in the morning and at bedtime.     amiodarone  (PACERONE ) 200 MG tablet Take 1 tablet (200 mg total) by mouth 2 (two) times daily.     Ascorbic Acid (VITAMIN C) 1000 MG tablet Take 1,000 mg by mouth in the morning.     Cholecalciferol (VITAMIN D3) 25 MCG (1000 UT) CAPS Take 1,000 Units by mouth in the morning.     ELIQUIS  5 MG TABS tablet TAKE 1 TABLET(5 MG) BY MOUTH TWICE DAILY 60 tablet 0   GARLIC PO Take 1 capsule by mouth in the morning.     loratadine (CLARITIN) 10 MG tablet Take 10 mg by mouth daily.     Melatonin 10 MG TABS Take 10 mg by mouth at bedtime as needed (sleep).     metoprolol  succinate (TOPROL -XL) 25 MG 24 hr tablet Take 1 tablet (25 mg total) by mouth in the morning. Take with or immediately following a meal. 90 tablet 3   Misc  Natural Products (ELDERBERRY IMMUNE COMPLEX) CHEW Chew 50 mg by mouth in the morning.     Multiple Vitamins-Minerals (COMPLETE MULTIVITAMIN/MINERAL PO) Take 1 tablet by mouth in the morning. [employer] Vitamin     Multiple Vitamins-Minerals (ZINC PO) Take 1 tablet by mouth daily.     NON FORMULARY Apply 1 application topically at bedtime. Veterinary liniment gel     NON FORMULARY Apply 1 application topically as needed. Body balm with magnesium 12mg      OMEGA-3 KRILL OIL PO Take 350 mg by mouth in the morning.      rosuvastatin  (CRESTOR ) 10 MG tablet TAKE 1 TABLET(10 MG) BY MOUTH DAILY (Patient taking differently: Take 10 mg by mouth at bedtime.) 90 tablet 3   spironolactone  (ALDACTONE ) 25 MG tablet TAKE 1 TABLET(25 MG) BY MOUTH EVERY MORNING 90 tablet 3   valsartan -hydrochlorothiazide  (DIOVAN -HCT) 320-25 MG tablet TAKE 1 TABLET BY MOUTH EVERY MORNING 90 tablet 3   vitamin B-12 (CYANOCOBALAMIN) 1000 MCG tablet Take 1,000 mcg by mouth in the morning.     No current facility-administered medications for this encounter.    Physical Exam: BP 114/84   Pulse 80   Ht 5' 7 (1.702 m)   Wt 122.8 kg   BMI 42.41 kg/m   GEN: Well nourished, well developed in no acute distress NECK: No JVD; No carotid bruits CARDIAC: Irregularly irregular rate and rhythm, no murmurs, rubs, gallops RESPIRATORY:  Clear to auscultation without rales, wheezing or rhonchi  ABDOMEN: Soft, non-tender, non-distended EXTREMITIES:  No edema; No deformity    Wt Readings from Last 3 Encounters:  03/19/23 122.8 kg  02/25/23 123.8 kg  02/15/23 123.9 kg     EKG today demonstrates  Afib Vent. rate 80 BPM PR interval * ms QRS duration 90 ms QT/QTcB 364/419 ms   Echo 10/25/19 demonstrated  Left ventricle cavity is normal in size. Mild concentric hypertrophy of  the left ventricle. Normal global wall motion. Normal LV systolic function  with EF 55%. Doppler evidence of grade II (pseudonormal) diastolic  dysfunction.  Left atrial cavity is severely dilated. Integrity of interatrial septum  could not be adequately assessed to limited visualization. Consider  alternate imaging stud, if clinically indicated.  Trileaflet aortic valve.  Trace aortic regurgitation.  Mild tricuspid regurgitation.  No evidence of pulmonary hypertension.    CHA2DS2-VASc Score = 4  The patient's score is based upon: CHF History: 0 HTN History: 1 Diabetes History: 0 Stroke History: 0 Vascular Disease History: 0 Age Score: 2 Gender Score: 1        ASSESSMENT AND PLAN: Persistent Atrial Fibrillation (ICD10:  I48.19) The patient's CHA2DS2-VASc score is 4, indicating a 4.8% annual risk of stroke.   S/p DCCV 02/01/23 with quick return of afib Will arrange repeat DCCV now that she has been on higher dose of amiodarone  for several weeks. Will have her follow up with EP afterwards to establish care. Continue amiodarone  200 mg BID, decrease to once daily on day of DCCV. Continue Toprol  25 mg daily, patient will take at bedtime to try and minimize fatigue side effects.  Continue Eliquis  5 mg BID  Secondary Hypercoagulable State (ICD10:  D68.69) The patient is at significant risk for stroke/thromboembolism based upon her CHA2DS2-VASc Score of 4.  Continue Apixaban  (Eliquis ).   High Risk Medication Monitoring (ICD 10: Z79.899) ECG intervals appropriate for amiodarone   Check cmet/TSH today.   HTN Stable on current regimen  Obesity Body mass index is 42.41 kg/m.  Encouraged lifestyle modification Referred  [facility] at previous visit  Snoring/suspected OSA Seen by Margarete, pending sleep study.     Follow up with Dr Ladona as scheduled and with EP to establish care.       Daril Kicks PA-C [facility] [address] [place], [place] [address] [phone]

## 2023-03-19 NOTE — Progress Notes (Signed)
 Primary Care Physician: Caro Harlene POUR, NP Primary Cardiologist: Gordy Bergamo, MD Electrophysiologist: None  Referring Physician: [facility]   Terry Koch is a 78 y.o. female with a history of HTN, HLD, atrial fibrillation who presents for follow up in the [facility]. Patient underwent cardioversion 12/25/2019 was unsuccessful. Underwent repeat cardioversion with Amiodarone  on board successfully to normal sinus rhythm 02/06/2020.  Recurrence of atrial fibrillation, patient underwent direct-current cardioversion on 06/10/2022. Patient is on Eliquis  for a CHADS2VASC score of 4.  She presented to the urgent care with chest pain on 12/15/22. She was in rate controlled afib and sent to ED. Workup was reassuring and she was discharged in afib. Her amiodarone  was increased from 100 mg to 200 mg on 12/21/22.  Patient is s/p DCCV 02/01/23 with quick return of afib. Her BB was decreased due to bradycardia. At her visit 02/15/23, her amiodarone  was increased to 200 mg BID in anticipation of another attempt at DCCV.   On follow up today, patient remains in rate controlled afib. She continues to have symptoms of fatigue. No bleeding issues on anticoagulation. She has seen sleep medicine and is pending a sleep study.   Today, she denies symptoms of palpitations, chest pain, shortness of breath, orthopnea, PND, lower extremity edema, dizziness, presyncope, syncope, bleeding, or neurologic sequela. The patient is tolerating medications without difficulties and is otherwise without complaint today.    Atrial Fibrillation Risk Factors:  she does have symptoms or diagnosis of sleep apnea. she does not have a history of rheumatic fever. she does not have a history of alcohol use.   Atrial Fibrillation Management history:  Previous antiarrhythmic drugs: amiodarone   Previous cardioversions: 12/25/19, 02/06/20, 06/10/22, 02/01/23 Previous ablations:  none Anticoagulation history: Eliquis   ROS- All systems are reviewed and negative except as per the HPI above.  Past Medical History:  Diagnosis Date   Arthritis    self report   Bone fracture    Right Foot   H/O hernia repair    History of hysterectomy    History of mammogram    Dr.Elizabeth Gari   History of MRI    Dr.Elizabeth Gari.   History of removal of skin mole 03/09/1997   Hyperlipidemia    Hypertension    Persistent atrial fibrillation (HCC)    Scalp hematoma    Wears glasses     Current Outpatient Medications  Medication Sig Dispense Refill   acetaminophen  (TYLENOL ) 650 MG CR tablet Take 1,300 mg by mouth in the morning and at bedtime.     amiodarone  (PACERONE ) 200 MG tablet Take 1 tablet (200 mg total) by mouth 2 (two) times daily.     Ascorbic Acid (VITAMIN C) 1000 MG tablet Take 1,000 mg by mouth in the morning.     Cholecalciferol (VITAMIN D3) 25 MCG (1000 UT) CAPS Take 1,000 Units by mouth in the morning.     ELIQUIS  5 MG TABS tablet TAKE 1 TABLET(5 MG) BY MOUTH TWICE DAILY 60 tablet 0   GARLIC PO Take 1 capsule by mouth in the morning.     loratadine (CLARITIN) 10 MG tablet Take 10 mg by mouth daily.     Melatonin 10 MG TABS Take 10 mg by mouth at bedtime as needed (sleep).     metoprolol  succinate (TOPROL -XL) 25 MG 24 hr tablet Take 1 tablet (25 mg total) by mouth in the morning. Take with or immediately following a meal. 90 tablet 3   Misc  Natural Products (ELDERBERRY IMMUNE COMPLEX) CHEW Chew 50 mg by mouth in the morning.     Multiple Vitamins-Minerals (COMPLETE MULTIVITAMIN/MINERAL PO) Take 1 tablet by mouth in the morning. [employer] Vitamin     Multiple Vitamins-Minerals (ZINC PO) Take 1 tablet by mouth daily.     NON FORMULARY Apply 1 application topically at bedtime. Veterinary liniment gel     NON FORMULARY Apply 1 application topically as needed. Body balm with magnesium 12mg      OMEGA-3 KRILL OIL PO Take 350 mg by mouth in the morning.      rosuvastatin  (CRESTOR ) 10 MG tablet TAKE 1 TABLET(10 MG) BY MOUTH DAILY (Patient taking differently: Take 10 mg by mouth at bedtime.) 90 tablet 3   spironolactone  (ALDACTONE ) 25 MG tablet TAKE 1 TABLET(25 MG) BY MOUTH EVERY MORNING 90 tablet 3   valsartan -hydrochlorothiazide  (DIOVAN -HCT) 320-25 MG tablet TAKE 1 TABLET BY MOUTH EVERY MORNING 90 tablet 3   vitamin B-12 (CYANOCOBALAMIN) 1000 MCG tablet Take 1,000 mcg by mouth in the morning.     No current facility-administered medications for this encounter.    Physical Exam: BP 114/84   Pulse 80   Ht 5' 7 (1.702 m)   Wt 122.8 kg   BMI 42.41 kg/m   GEN: Well nourished, well developed in no acute distress NECK: No JVD; No carotid bruits CARDIAC: Irregularly irregular rate and rhythm, no murmurs, rubs, gallops RESPIRATORY:  Clear to auscultation without rales, wheezing or rhonchi  ABDOMEN: Soft, non-tender, non-distended EXTREMITIES:  No edema; No deformity    Wt Readings from Last 3 Encounters:  03/19/23 122.8 kg  02/25/23 123.8 kg  02/15/23 123.9 kg     EKG today demonstrates  Afib Vent. rate 80 BPM PR interval * ms QRS duration 90 ms QT/QTcB 364/419 ms   Echo 10/25/19 demonstrated  Left ventricle cavity is normal in size. Mild concentric hypertrophy of  the left ventricle. Normal global wall motion. Normal LV systolic function  with EF 55%. Doppler evidence of grade II (pseudonormal) diastolic  dysfunction.  Left atrial cavity is severely dilated. Integrity of interatrial septum  could not be adequately assessed to limited visualization. Consider  alternate imaging stud, if clinically indicated.  Trileaflet aortic valve.  Trace aortic regurgitation.  Mild tricuspid regurgitation.  No evidence of pulmonary hypertension.    CHA2DS2-VASc Score = 4  The patient's score is based upon: CHF History: 0 HTN History: 1 Diabetes History: 0 Stroke History: 0 Vascular Disease History: 0 Age Score: 2 Gender Score: 1        ASSESSMENT AND PLAN: Persistent Atrial Fibrillation (ICD10:  I48.19) The patient's CHA2DS2-VASc score is 4, indicating a 4.8% annual risk of stroke.   S/p DCCV 02/01/23 with quick return of afib Will arrange repeat DCCV now that she has been on higher dose of amiodarone  for several weeks. Will have her follow up with EP afterwards to establish care. Continue amiodarone  200 mg BID, decrease to once daily on day of DCCV. Continue Toprol  25 mg daily, patient will take at bedtime to try and minimize fatigue side effects.  Continue Eliquis  5 mg BID  Secondary Hypercoagulable State (ICD10:  D68.69) The patient is at significant risk for stroke/thromboembolism based upon her CHA2DS2-VASc Score of 4.  Continue Apixaban  (Eliquis ).   High Risk Medication Monitoring (ICD 10: Z79.899) ECG intervals appropriate for amiodarone   Check cmet/TSH today.   HTN Stable on current regimen  Obesity Body mass index is 42.41 kg/m.  Encouraged lifestyle modification Referred  [facility] at previous visit  Snoring/suspected OSA Seen by Margarete, pending sleep study.     Follow up with Dr Ladona as scheduled and with EP to establish care.       Daril Kicks PA-C [facility] [address] [place], [place] [address] [phone]

## 2023-03-22 NOTE — Progress Notes (Signed)
 Spoke to pt and instructed them to come at 0930 and to be NPO after 0000.  Confirmed no missed doses of AC and instructed to take in AM with a small sip of water.  Confirmed that pt will have a ride home and someone to stay with them for 24 hours after the procedure. Instructed patient to not wear any jewelry or lotion.

## 2023-03-23 ENCOUNTER — Other Ambulatory Visit: Payer: Self-pay

## 2023-03-23 ENCOUNTER — Encounter (HOSPITAL_COMMUNITY)
Admission: RE | Disposition: A | Discharge status: Home / Self Care | Payer: Self-pay | Source: Home / Self Care | Attending: Cardiovascular Disease

## 2023-03-23 ENCOUNTER — Ambulatory Visit (HOSPITAL_COMMUNITY): Payer: Self-pay | Admitting: Registered Nurse

## 2023-03-23 ENCOUNTER — Encounter (HOSPITAL_COMMUNITY): Payer: Self-pay | Admitting: Cardiovascular Disease

## 2023-03-23 ENCOUNTER — Ambulatory Visit (HOSPITAL_COMMUNITY)
Admission: RE | Admit: 2023-03-23 | Discharge: 2023-03-23 | Disposition: A | Discharge status: Home / Self Care | Payer: Medicare Other | Attending: Cardiovascular Disease | Admitting: Cardiovascular Disease

## 2023-03-23 DIAGNOSIS — R0683 Snoring: Secondary | ICD-10-CM | POA: Diagnosis not present

## 2023-03-23 DIAGNOSIS — I10 Essential (primary) hypertension: Secondary | ICD-10-CM | POA: Diagnosis not present

## 2023-03-23 DIAGNOSIS — E66813 Obesity, class 3: Secondary | ICD-10-CM | POA: Diagnosis not present

## 2023-03-23 DIAGNOSIS — D6869 Other thrombophilia: Secondary | ICD-10-CM | POA: Insufficient documentation

## 2023-03-23 DIAGNOSIS — I4891 Unspecified atrial fibrillation: Secondary | ICD-10-CM | POA: Diagnosis not present

## 2023-03-23 DIAGNOSIS — Z7901 Long term (current) use of anticoagulants: Secondary | ICD-10-CM | POA: Diagnosis not present

## 2023-03-23 DIAGNOSIS — Z79899 Other long term (current) drug therapy: Secondary | ICD-10-CM | POA: Insufficient documentation

## 2023-03-23 DIAGNOSIS — I4819 Other persistent atrial fibrillation: Secondary | ICD-10-CM | POA: Diagnosis present

## 2023-03-23 DIAGNOSIS — Z6841 Body Mass Index (BMI) 40.0 and over, adult: Secondary | ICD-10-CM | POA: Diagnosis not present

## 2023-03-23 DIAGNOSIS — E785 Hyperlipidemia, unspecified: Secondary | ICD-10-CM | POA: Insufficient documentation

## 2023-03-23 HISTORY — PX: CARDIOVERSION: EP1203

## 2023-03-23 SURGERY — CARDIOVERSION (CATH LAB)
Anesthesia: General

## 2023-03-23 MED ORDER — PROPOFOL 10 MG/ML IV BOLUS
INTRAVENOUS | Status: DC | PRN
  Administered 2023-03-23 (×2): 30 mg via INTRAVENOUS
  Administered 2023-03-23: 20 mg via INTRAVENOUS
  Administered 2023-03-23: 10 mg via INTRAVENOUS

## 2023-03-23 MED ORDER — SODIUM CHLORIDE 0.9% FLUSH: 3.0000 mL | INTRAVENOUS | Status: DC | PRN

## 2023-03-23 MED ORDER — SODIUM CHLORIDE 0.9% FLUSH: 3.0000 mL | Freq: Two times a day (BID) | INTRAVENOUS | Status: DC

## 2023-03-23 SURGICAL SUPPLY — 1 items: PAD DEFIB RADIO PHYSIO CONN (PAD) ×1 IMPLANT

## 2023-03-23 NOTE — CV Procedure (Signed)
 Electrical Cardioversion Procedure Note Terry Koch [id number] Dec 22, 1945  Procedure: Electrical Cardioversion Indications:  Atrial Fibrillation  Procedure Details Consent: Risks of procedure as well as the alternatives and risks of each were explained to the (patient/caregiver).  Consent for procedure obtained. Time Out: Verified patient identification, verified procedure, site/side was marked, verified correct patient position, special equipment/implants available, medications/allergies/relevent history reviewed, required imaging and test results available.  Performed  Patient placed on cardiac monitor, pulse oximetry, supplemental oxygen as necessary.  Sedation given:  propofol  Pacer pads placed anterior and posterior chest.  Cardioverted 1 time(s).  Cardioverted at 300J.  Evaluation Findings: Post procedure EKG shows:  sinus bradycardia Complications: None Patient did tolerate procedure well.   Terry Scarce, MD 03/23/2023, 10:40 AM

## 2023-03-23 NOTE — Interval H&P Note (Signed)
 History and Physical Interval Note:  03/23/2023 10:29 AM  Terry Koch  has presented today for surgery, with the diagnosis of AFIB.  The various methods of treatment have been discussed with the patient and family. After consideration of risks, benefits and other options for treatment, the patient has consented to  Procedure(s): CARDIOVERSION (N/A) as a surgical intervention.  The patient's history has been reviewed, patient examined, no change in status, stable for surgery.  I have reviewed the patient's chart and labs.  Questions were answered to the patient's satisfaction.     [name], MD

## 2023-03-23 NOTE — Transfer of Care (Signed)
 Immediate Anesthesia Transfer of Care Note  Patient: Terry Koch  Procedure(s) Performed: CARDIOVERSION  Patient Location: PACU  Anesthesia Type:MAC  Level of Consciousness: drowsy  Airway & Oxygen Therapy: Patient connected to face mask oxygen  Post-op Assessment: Report given to RN and Post -op Vital signs reviewed and stable  Post vital signs: Reviewed and stable  Last Vitals:  Vitals Value Taken Time  BP 123/73   Temp    Pulse 49   Resp    SpO2 100     Last Pain:  Vitals:   03/23/23 0933  TempSrc: Temporal         Complications: There were no known notable events for this encounter.

## 2023-03-24 ENCOUNTER — Encounter (HOSPITAL_COMMUNITY): Payer: Self-pay | Admitting: Cardiovascular Disease

## 2023-03-24 NOTE — Anesthesia Preprocedure Evaluation (Signed)
 Anesthesia Evaluation  Patient identified by MRN, date of birth, ID band Patient awake    Reviewed: Allergy & Precautions, NPO status , Patient's Chart, lab work & pertinent test results, reviewed documented beta blocker date and time   History of Anesthesia Complications Negative for: history of anesthetic complications  Airway Mallampati: III  TM Distance: >3 FB     Dental  (+) Missing, Dental Advisory Given   Pulmonary neg pulmonary ROS   breath sounds clear to auscultation       Cardiovascular hypertension, Pt. on medications and Pt. on home beta blockers + dysrhythmias (eliquis , no missed doses) Atrial Fibrillation  Rhythm:Irregular  Echo 10/25/19 Left ventricle cavity is normal in size. Mild concentric hypertrophy of  the left ventricle. Normal global wall motion. Normal LV systolic function  with EF 55%. Doppler evidence of grade II (pseudonormal) diastolic  dysfunction.  Left atrial cavity is severely dilated. Integrity of interatrial septum  could not be adequately assessed to limited visualization. Consider  alternate imaging stud, if clinically indicated.  Trileaflet aortic valve.  Trace aortic regurgitation.  Mild tricuspid regurgitation.  No evidence of pulmonary hypertension.    Neuro/Psych negative neurological ROS  negative psych ROS   GI/Hepatic negative GI ROS, Neg liver ROS,,,  Endo/Other    Class 3 obesity (BMI 43)  Renal/GU negative Renal ROS     Musculoskeletal  (+) Arthritis ,    Abdominal  (+) + obese  Peds  Hematology negative hematology ROS (+)   Anesthesia Other Findings   Reproductive/Obstetrics                              Anesthesia Physical Anesthesia Plan  ASA: 3  Anesthesia Plan: General   Post-op Pain Management:    Induction: Intravenous  PONV Risk Score and Plan: 3 and Treatment may vary due to age or medical condition  Airway Management  Planned: Natural Airway  Additional Equipment: None  Intra-op Plan:   Post-operative Plan:   Informed Consent: I have reviewed the patients History and Physical, chart, labs and discussed the procedure including the risks, benefits and alternatives for the proposed anesthesia with the patient or authorized representative who has indicated his/her understanding and acceptance.     Dental advisory given  Plan Discussed with: CRNA and Anesthesiologist  Anesthesia Plan Comments:         Anesthesia Quick Evaluation

## 2023-03-24 NOTE — Anesthesia Postprocedure Evaluation (Addendum)
 Anesthesia Post Note  Patient: Terry Koch  Procedure(s) Performed: CARDIOVERSION     Patient location during evaluation: Cath Lab Anesthesia Type: General Level of consciousness: awake and alert Pain management: pain level controlled Vital Signs Assessment: post-procedure vital signs reviewed and stable Respiratory status: spontaneous breathing, nonlabored ventilation and respiratory function stable Cardiovascular status: blood pressure returned to baseline and stable Postop Assessment: no apparent nausea or vomiting Anesthetic complications: no   There were no known notable events for this encounter.  Last Vitals:  Vitals:   03/23/23 1051 03/23/23 1055  BP: 119/65 117/64  Pulse: (!) 45 (!) 44  Resp: 15 15  Temp:    SpO2: 96% 98%    Last Pain:  Vitals:   03/23/23 1050  TempSrc:   PainSc: 0-No pain                 Ifeanyichukwu Wickham

## 2023-03-26 ENCOUNTER — Ambulatory Visit
Admission: RE | Admit: 2023-03-26 | Discharge: 2023-03-26 | Disposition: A | Discharge status: Home / Self Care | Payer: Medicare Other | Source: Ambulatory Visit | Attending: Nurse Practitioner | Admitting: Nurse Practitioner

## 2023-03-26 DIAGNOSIS — E2839 Other primary ovarian failure: Secondary | ICD-10-CM

## 2023-03-29 ENCOUNTER — Encounter: Payer: Self-pay | Admitting: Nurse Practitioner

## 2023-03-30 ENCOUNTER — Other Ambulatory Visit: Payer: Self-pay | Admitting: Cardiology

## 2023-03-30 ENCOUNTER — Other Ambulatory Visit: Payer: Self-pay | Admitting: Nurse Practitioner

## 2023-03-30 DIAGNOSIS — I480 Paroxysmal atrial fibrillation: Secondary | ICD-10-CM

## 2023-03-30 NOTE — Telephone Encounter (Signed)
Eliquis 5mg  refill request received. Patient is 78 years old, weight-122.8kg, Crea-1.16 on 03/19/23, Diagnosis-Afib, and last seen by Alphonzo Severance on 03/19/23. Dose is appropriate based on dosing criteria. Will send in refill to requested pharmacy.

## 2023-04-06 ENCOUNTER — Encounter: Payer: Self-pay | Admitting: Cardiology

## 2023-04-06 ENCOUNTER — Ambulatory Visit: Payer: Medicare Other | Attending: Cardiology | Admitting: Cardiology

## 2023-04-06 VITALS — BP 118/70 | HR 54 | Ht 67.0 in | Wt 274.0 lb

## 2023-04-06 DIAGNOSIS — D6869 Other thrombophilia: Secondary | ICD-10-CM | POA: Insufficient documentation

## 2023-04-06 DIAGNOSIS — Z5181 Encounter for therapeutic drug level monitoring: Secondary | ICD-10-CM | POA: Diagnosis not present

## 2023-04-06 DIAGNOSIS — Z79899 Other long term (current) drug therapy: Secondary | ICD-10-CM | POA: Diagnosis present

## 2023-04-06 DIAGNOSIS — I4819 Other persistent atrial fibrillation: Secondary | ICD-10-CM | POA: Diagnosis not present

## 2023-04-06 NOTE — Progress Notes (Signed)
Electrophysiology Office Note:   Date:  04/06/2023  ID:  Terry Koch, DOB 1945-08-02, MRN [id number]  Primary Cardiologist: Yates Decamp, MD Primary Heart Failure: None Electrophysiologist: None      History of Present Illness:   Terry Koch is a 78 y.o. female with h/o, hypertension, atrial fibrillation seen today for  for Electrophysiology evaluation of fibrillation at the request of Jorja Loa.    She has a history of atrial fibrillation.  She has had cardioversions in the past.  She is on amiodarone.  She presented to urgent care with chest pain and was found to be in atrial fibrillation 12/15/2022.  Her amiodarone was increased from 100 to 200 mg.  She is post cardioversion 02/01/2023 with quick return of her atrial fibrillation.  She has symptoms of fatigue.  She has a sleep study pending.  She feels poorly when she is in atrial fibrillation.  Now that she is in sinus rhythm, she has significantly more energy and less shortness of breath, though she does continue to have some drowsiness.  Review of systems complete and found to be negative unless listed in HPI.   EP Information / Studies Reviewed:    EKG is ordered today. Personal review as below.  EKG Interpretation Date/Time:  Tuesday April 06 2023 16:04:50 EST Ventricular Rate:  54 PR Interval:  198 QRS Duration:  88 QT Interval:  428 QTC Calculation: 405 R Axis:   46  Text Interpretation: Sinus bradycardia Cannot rule out Anterior infarct (cited on or before 15-Feb-2023) When compared with ECG of 23-Mar-2023 10:43, Premature atrial complexes are no longer Present Confirmed by Mikaeel Petrow ([id number]) on 04/06/2023 4:10:17 PM     Risk Assessment/Calculations:    CHA2DS2-VASc Score = 4   This indicates a 4.8% annual risk of stroke. The patient's score is based upon: CHF History: 0 HTN History: 1 Diabetes History: 0 Stroke History: 0 Vascular Disease History: 0 Age Score: 2 Gender Score: 1              Physical Exam:   VS:  BP 118/70 (BP Location: Left Arm, Patient Position: Sitting, Cuff Size: Large)   Pulse (!) 54   Ht 5\' 7"  (1.702 m)   Wt 274 lb (124.3 kg)   SpO2 96%   BMI 42.91 kg/m    Wt Readings from Last 3 Encounters:  04/06/23 274 lb (124.3 kg)  03/19/23 270 lb 12.8 oz (122.8 kg)  02/25/23 273 lb (123.8 kg)     GEN: Well nourished, well developed in no acute distress NECK: No JVD; No carotid bruits CARDIAC: Regular rate and rhythm, no murmurs, rubs, gallops RESPIRATORY:  Clear to auscultation without rales, wheezing or rhonchi  ABDOMEN: Soft, non-tender, non-distended EXTREMITIES:  No edema; No deformity   ASSESSMENT AND PLAN:    1.  Persistent atrial fibrillation: Currently on amiodarone and metoprolol.  She is continue to have episodes of atrial fibrillation.  Her amiodarone was increased to 200 mg daily.  She has had no further episodes of atrial fibrillation.  Despite this, she would prefer amiodarone to be a short-term medication.  She would potentially be interested in ablation, but she would like to try weight loss, exercise, healthy diet.  Marckus Hanover see her back in 6 months.  2.  Secondary hypercoagulable state: Currently on Eliquis for atrial fibrillation  3.  High risk medication monitoring: Currently on amiodarone.  Recent labs within normal limits.  4.  Hypertension: Currently well-controlled  5.  Obesity: Lifestyle  modification encouraged  6.  Snoring: Sleep study pending  Follow up with Dr. Elberta Fortis in 6 months  Signed, Lestat Golob Jorja Loa, MD

## 2023-04-09 ENCOUNTER — Ambulatory Visit: Payer: Medicare Other | Attending: Cardiology | Admitting: Cardiology

## 2023-04-09 ENCOUNTER — Encounter: Payer: Self-pay | Admitting: Cardiology

## 2023-04-09 VITALS — BP 122/70 | HR 59 | Resp 16 | Ht 67.0 in | Wt 275.6 lb

## 2023-04-09 DIAGNOSIS — D6869 Other thrombophilia: Secondary | ICD-10-CM | POA: Insufficient documentation

## 2023-04-09 DIAGNOSIS — I480 Paroxysmal atrial fibrillation: Secondary | ICD-10-CM | POA: Insufficient documentation

## 2023-04-09 DIAGNOSIS — I5032 Chronic diastolic (congestive) heart failure: Secondary | ICD-10-CM | POA: Diagnosis present

## 2023-04-09 DIAGNOSIS — I10 Essential (primary) hypertension: Secondary | ICD-10-CM | POA: Diagnosis present

## 2023-04-09 NOTE — Progress Notes (Unsigned)
Cardiology Office Note:  .   Date:  04/10/2023  ID:  Terry Koch, DOB [birth year], MRN [id number] PCP: Sharon Seller, NP  [facility] Providers Cardiologist:  Yates Decamp, MD Electrophysiologist:  Will Jorja Loa, MD   History of Present Illness: .   Terry Koch is a 78 y.o. African-American female with hypertension, hyperlipidemia, morbid obesity, colonic polyps requiring serial surveillance colonoscopy, no prior history of GI bleed, has had multiple direct-current cardioversions due to symptomatic atrial fibrillation, presently on amiodarone and has been maintaining sinus rhythm.  Her last cardioversion was on 03/23/2023, amiodarone dose has been increased from 100 mg daily to 200 mg daily.  Patient is presently asymptomatic and has not had any further episodes of difficult palpitations.  States that she does not feel well when she goes into atrial fibrillation.  She recently underwent a sleep study at [facility] and is awaiting a follow-up conference on February 12 to discuss the results. She expresses concern about potentially needing a CPAP machine.   Discussed the use of AI scribe software for clinical note transcription with the patient, who gave verbal consent to proceed.   Labs   Lab Results  Component Value Date   CHOL 148 10/16/2022   HDL 47 (L) 10/16/2022   LDLCALC 76 10/16/2022   TRIG 153 (H) 10/16/2022   CHOLHDL 3.1 10/16/2022   Lab Results  Component Value Date   NA 139 03/19/2023   K 4.3 03/19/2023   CO2 23 03/19/2023   GLUCOSE 132 (H) 03/19/2023   BUN 13 03/19/2023   CREATININE 1.16 (H) 03/19/2023   CALCIUM 9.3 03/19/2023   EGFR 48 (L) 10/16/2022   GFRNONAA 49 (L) 03/19/2023      Latest Ref Rng & Units 03/19/2023   10:30 AM 01/27/2023   10:43 AM 12/15/2022    5:04 PM  BMP  Glucose 70 - 99 mg/dL 409  97  96   BUN 8 - 23 mg/dL 13  17  16    Creatinine 0.44 - 1.00 mg/dL 8.11  9.14  7.82   Sodium 135 - 145 mmol/L 139   140  137   Potassium 3.5 - 5.1 mmol/L 4.3  4.1  3.9   Chloride 98 - 111 mmol/L 105  105  101   CO2 22 - 32 mmol/L 23  27  24    Calcium 8.9 - 10.3 mg/dL 9.3  9.4  9.2       Latest Ref Rng & Units 03/19/2023   10:30 AM 01/27/2023   10:43 AM 12/15/2022    5:04 PM  CBC  WBC 4.0 - 10.5 K/uL 5.4  4.5  6.0   Hemoglobin 12.0 - 15.0 g/dL 95.6  21.3  08.6   Hematocrit 36.0 - 46.0 % 40.1  40.6  39.3   Platelets 150 - 400 K/uL 238  207  195    Lab Results  Component Value Date   HGBA1C 6.1 (H) 10/16/2022    Review of Systems  Cardiovascular:  Positive for palpitations. Negative for chest pain, dyspnea on exertion and leg swelling.    Physical Exam:   VS:  BP 122/70 (BP Location: Left Arm, Patient Position: Sitting, Cuff Size: Large)   Pulse (!) 59   Resp 16   Ht 5\' 7"  (1.702 m)   Wt 275 lb 9.6 oz (125 kg)   SpO2 98%   BMI 43.17 kg/m    Wt Readings from Last 3 Encounters:  04/09/23 275 lb 9.6  oz (125 kg)  04/06/23 274 lb (124.3 kg)  03/19/23 270 lb 12.8 oz (122.8 kg)    Physical Exam Constitutional:      Appearance: She is obese.  Neck:     Vascular: No carotid bruit or JVD.  Cardiovascular:     Rate and Rhythm: Normal rate and regular rhythm.     Pulses: Intact distal pulses.     Heart sounds: Normal heart sounds. No murmur heard.    No gallop.  Pulmonary:     Effort: Pulmonary effort is normal.     Breath sounds: Normal breath sounds.  Abdominal:     General: Bowel sounds are normal.     Palpations: Abdomen is soft.  Musculoskeletal:     Right lower leg: No edema.     Left lower leg: No edema.    Studies Reviewed: Marland Kitchen    ECHOCARDIOGRAM COMPLETE 10/25/2019  Left ventricle cavity is normal in size. Mild concentric hypertrophy of the left ventricle. Normal global wall motion. Normal LV systolic function with EF 55%. Doppler evidence of grade II (pseudonormal) diastolic dysfunction. Left atrial cavity is severely dilated. Integrity of interatrial septum could not be  adequately assessed to limited visualization. Consider alternate imaging stud, if clinically indicated. Trileaflet aortic valve.  Trace aortic regurgitation. Mild tricuspid regurgitation. No evidence of pulmonary hypertension.  EKG:    EKG 04/06/2023: Normal sinus rhythm at the rate of 54 bpm, normal axis, poor R wave progression, cannot exclude anteroseptal infarct 4.  Nonspecific T abnormality.  Normal QT interval.  Personally reviewed.  Medications and allergies    Allergies  Allergen Reactions   Amlodipine Other (See Comments)    Leg edema     Current Outpatient Medications:    acetaminophen (TYLENOL) 650 MG CR tablet, Take 1,300 mg by mouth in the morning and at bedtime., Disp: , Rfl:    amiodarone (PACERONE) 200 MG tablet, Take 1 tablet (200 mg total) by mouth daily. (Patient taking differently: Take 200 mg by mouth 2 (two) times daily.), Disp: 90 tablet, Rfl: 1   apixaban (ELIQUIS) 5 MG TABS tablet, TAKE 1 TABLET BY MOUTH TWICE DAILY, Disp: 180 tablet, Rfl: 1   Garlic 1000 MG CAPS, Take 1,000 mg by mouth in the morning., Disp: , Rfl:    loratadine (CLARITIN) 10 MG tablet, Take 10 mg by mouth daily as needed for allergies or rhinitis., Disp: , Rfl:    metoprolol succinate (TOPROL-XL) 25 MG 24 hr tablet, Take 1 tablet (25 mg total) by mouth in the morning. Take with or immediately following a meal. (Patient taking differently: Take 50 mg by mouth in the morning. Take with or immediately following a meal.), Disp: 90 tablet, Rfl: 3   Misc Natural Products (ELDERBERRY IMMUNE COMPLEX PO), Take 550 mg by mouth in the morning., Disp: , Rfl:    Multiple Minerals (CALCIUM/MAGNESIUM/ZINC PO), Take 1 tablet by mouth daily. Vit d3, Disp: , Rfl:    Multiple Vitamins-Minerals (COMPLETE MULTIVITAMIN/MINERAL PO), Take 1 tablet by mouth in the morning. One a day, Disp: , Rfl:    Multiple Vitamins-Minerals (EMERGEN-C IMMUNE PO), Take 5 mLs by mouth daily as needed. With 8 oz of water, Disp: , Rfl:     NON FORMULARY, Apply 1 application  topically as needed (ankle pain). Body balm with magnesium 12mg , Disp: , Rfl:    Omega-3 Krill Oil 500 MG CAPS, Take 500 mg by mouth in the morning., Disp: , Rfl:    OVER THE COUNTER MEDICATION, Apply  1 Application topically daily as needed (Pain). veterinary liniment gel, Disp: , Rfl:    Psyllium 60.3 % POWD, Take 5 mLs by mouth daily., Disp: , Rfl:    rosuvastatin (CRESTOR) 10 MG tablet, TAKE 1 TABLET(10 MG) BY MOUTH DAILY (Patient taking differently: Take 10 mg by mouth at bedtime.), Disp: 90 tablet, Rfl: 3   spironolactone (ALDACTONE) 25 MG tablet, TAKE 1 TABLET(25 MG) BY MOUTH EVERY MORNING, Disp: 90 tablet, Rfl: 3   valsartan-hydrochlorothiazide (DIOVAN-HCT) 320-25 MG tablet, TAKE 1 TABLET BY MOUTH EVERY MORNING, Disp: 90 tablet, Rfl: 3   vitamin B-12 (CYANOCOBALAMIN) 1000 MCG tablet, Take 1,000 mcg by mouth in the morning., Disp: , Rfl:    ASSESSMENT AND PLAN: .      ICD-10-CM   1. Paroxysmal atrial fibrillation (HCC)  I48.0 ECHOCARDIOGRAM COMPLETE    2. Secondary hypercoagulable state (HCC)  D68.69     3. Primary hypertension  I10     4. Chronic diastolic heart failure (HCC)  Z61.09 ECHOCARDIOGRAM COMPLETE     Click Here to Calculate/Change CHADS2VASc Score The patient's CHADS2-VASc score is 4, indicating a 4.8% annual risk of stroke.  Therefore, anticoagulation is recommended.   CHF History: No HTN History: Yes Diabetes History: No Stroke History: No Vascular Disease History: No   Assessment and Plan    Atrial Fibrillation (AFib) Intermittent AFib episodes, most recent cardioversion two weeks ago. Currently in normal sinus rhythm. Experiences fatigue and dyspnea during AFib episodes. Discussed potential switch to Tikosyn if episodes persist, requiring one-month discontinuation of amiodarone. Emphasized weight loss to manage AFib and avoid ablation. Brief AFib episodes are not concerning; persistent episodes require intervention. Valsartan  HCT and metoprolol reduce AFib risk. - Continue amiodarone 200 mg daily - Continue valsartan HCT - Continue metoprolol - Continue Eliquis for stroke prevention - Encourage weight loss - Consider Tikosyn if AFib episodes persist, with one-month discontinuation of amiodarone  Chronic Diastolic Heart Failure No heart failure symptoms while in sinus rhythm. Fatigue and dyspnea during AFib episodes. - Order echocardiogram as she has not had an echocardiogram since 2021  Hypertension Blood pressure well-controlled with current medications. - Continue current antihypertensive regimen (valsartan HCT, metoprolol, spironolactone)  General Health Maintenance Discussed weight loss for overall health and AFib management. Encouraged dietary modifications and potential CPAP for sleep apnea if diagnosed. - Encourage dietary modifications, including reducing bread and soda intake - Encourage regular physical activity - Discuss potential benefits of CPAP for sleep apnea if diagnosed  Follow-up - Follow-up appointment in six months  Signed,  Yates Decamp, MD, [facility] 04/10/2023, 12:38 PM [facility] [address] [place], [place] [address] Phone: [phone]. Fax:  [phone]

## 2023-04-09 NOTE — Patient Instructions (Signed)
 Medication Instructions:  Your physician recommends that you continue on your current medications as directed. Please refer to the Current Medication list given to you today.  *If you need a refill on your cardiac medications before your next appointment, please call your pharmacy*   Lab Work: none If you have labs (blood work) drawn today and your tests are completely normal, you will receive your results only by: MyChart Message (if you have MyChart) OR A paper copy in the mail If you have any lab test that is abnormal or we need to change your treatment, we will call you to review the results.   Testing/Procedures: Your physician has requested that you have an echocardiogram. Echocardiography is a painless test that uses sound waves to create images of your heart. It provides your doctor with information about the size and shape of your heart and how well your heart's chambers and valves are working. This procedure takes approximately one hour. There are no restrictions for this procedure. Please do NOT wear cologne, perfume, aftershave, or lotions (deodorant is allowed). Please arrive 15 minutes prior to your appointment time.  Please note: We ask at that you not bring children with you during ultrasound (echo/ vascular) testing. Due to room size and safety concerns, children are not allowed in the ultrasound rooms during exams. Our front office staff cannot provide observation of children in our lobby area while testing is being conducted. An adult accompanying a patient to their appointment will only be allowed in the ultrasound room at the discretion of the ultrasound technician under special circumstances. We apologize for any inconvenience.    Follow-Up: At [facility], you and your health needs are our priority.  As part of our continuing mission to provide you with exceptional heart care, we have created designated Provider Care Teams.  These Care Teams include your  primary Cardiologist (physician) and Advanced Practice Providers (APPs -  Physician Assistants and Nurse Practitioners) who all work together to provide you with the care you need, when you need it.  We recommend signing up for the patient portal called "MyChart".  Sign up information is provided on this After Visit Summary.  MyChart is used to connect with patients for Virtual Visits (Telemedicine).  Patients are able to view lab/test results, encounter notes, upcoming appointments, etc.  Non-urgent messages can be sent to your provider as well.   To learn more about what you can do with MyChart, go to [email or web address].    Your next appointment:   6 month(s)  Provider:   Yates Decamp, MD     Other Instructions

## 2023-04-13 ENCOUNTER — Other Ambulatory Visit: Payer: Self-pay | Admitting: Nurse Practitioner

## 2023-04-13 DIAGNOSIS — Z1231 Encounter for screening mammogram for malignant neoplasm of breast: Secondary | ICD-10-CM

## 2023-04-16 ENCOUNTER — Ambulatory Visit (HOSPITAL_COMMUNITY)
Admission: EM | Admit: 2023-04-16 | Discharge: 2023-04-16 | Disposition: A | Discharge status: Home / Self Care | Payer: Medicare Other | Attending: Physician Assistant | Admitting: Physician Assistant

## 2023-04-16 ENCOUNTER — Encounter (HOSPITAL_COMMUNITY): Payer: Self-pay | Admitting: Emergency Medicine

## 2023-04-16 ENCOUNTER — Ambulatory Visit (INDEPENDENT_AMBULATORY_CARE_PROVIDER_SITE_OTHER): Payer: Medicare Other

## 2023-04-16 DIAGNOSIS — W19XXXA Unspecified fall, initial encounter: Secondary | ICD-10-CM | POA: Diagnosis not present

## 2023-04-16 DIAGNOSIS — Y92009 Unspecified place in unspecified non-institutional (private) residence as the place of occurrence of the external cause: Secondary | ICD-10-CM

## 2023-04-16 DIAGNOSIS — G8911 Acute pain due to trauma: Secondary | ICD-10-CM

## 2023-04-16 DIAGNOSIS — M25512 Pain in left shoulder: Secondary | ICD-10-CM

## 2023-04-16 MED ORDER — ACETAMINOPHEN 325 MG PO TABS
650.0000 mg | ORAL_TABLET | Freq: Once | ORAL | Status: AC
  Administered 2023-04-16: 650 mg via ORAL

## 2023-04-16 MED ORDER — ACETAMINOPHEN 325 MG PO TABS
ORAL_TABLET | ORAL | Status: AC
  Filled 2023-04-16: qty 2

## 2023-04-16 NOTE — ED Triage Notes (Signed)
 Pt fell going into another room. Reports had to lay on the floor little bit to get self together before could get up. Denies LOC. Pt takes Eliquis . Pt c/o left shoulder pain. Used tylenol  and heat last night and today.

## 2023-04-16 NOTE — ED Provider Notes (Signed)
 [facility]    CSN: [id number] Arrival date & time: 04/16/23  0920      History   Chief Complaint Chief Complaint  Patient presents with   Fall   Shoulder Pain    HPI Terry Koch is a 78 y.o. female.   HPI   Patient with previous history of persistent atrial fibrillation, hypertension, arthritis presents today for evaluation for left shoulder pain after a fall.  She is currently taking Eliquis .  She denies loss of consciousness.  She has previously used Tylenol  and heat last night and today to assist with pain.  She states she fell against a table and caught herself with her left arm and hand and lowered to the floor- she states she felt stunned and had to lay on the floor for a few moments but then was able to slowly pull herself up and sit in a chair. She states this occurred after she took her Amiodarone    She states that she does not recall hitting head but states she thinks she may have lost consciousness. She states she did feel nauseous and vomited after the fall while she was laying down  Her husband states she was talking to him the entire time after she fell.   She reports her left arm is still sore and feels like a lead brick  She states she has no mobility in her left arm and hurts a lot. She reports it feels like it is pulling down. Her husband states she was moving her arm after the fall but it seems to have increased in pain after the night passed  Interventions: Tylenol , warm compresses   She is right hand dominant    Past Medical History:  Diagnosis Date   Arthritis    self report   Bone fracture    Right Foot   H/O hernia repair    History of hysterectomy    History of mammogram    Dr.Elizabeth Gari   History of MRI    Dr.Elizabeth Gari.   History of removal of skin mole 03/09/1997   Hyperlipidemia    Hypertension    Persistent atrial fibrillation (HCC)    Scalp hematoma    Wears glasses     Patient Active Problem List    Diagnosis Date Noted   Encounter for monitoring amiodarone  therapy 03/19/2023   Hypercoagulable state due to persistent atrial fibrillation (HCC) 01/05/2023   Arthritis    Hyperlipidemia    Hypertension    Persistent atrial fibrillation [facility])     Past Surgical History:  Procedure Laterality Date   ABDOMINAL HYSTERECTOMY     BREAST BIOPSY  03/09/1997   Dr.Finder   BREAST BIOPSY Right 11/07/2019   FIBROADENOMA WITH CALCIFICATIONS   BREAST BIOPSY Left 11/07/2019   FIBROADENOMA WITH CALCIFICATIONS   CARDIOVERSION N/A 12/25/2019   Procedure: CARDIOVERSION;  Surgeon: Ladona Heinz, MD;  Location: [facility] ENDOSCOPY;  Service: Cardiovascular;  Laterality: N/A;   CARDIOVERSION N/A 02/06/2020   Procedure: CARDIOVERSION;  Surgeon: Ladona Heinz, MD;  Location: [facility] ENDOSCOPY;  Service: Cardiovascular;  Laterality: N/A;   CARDIOVERSION N/A 06/10/2022   Procedure: CARDIOVERSION;  Surgeon: Michele Richardson, DO;  Location: [facility] ENDOSCOPY;  Service: Cardiovascular;  Laterality: N/A;   CARDIOVERSION N/A 02/01/2023   Procedure: CARDIOVERSION (CATH LAB);  Surgeon: Kate Lonni CROME, MD;  Location: [facility] INVASIVE CV LAB;  Service: Cardiovascular;  Laterality: N/A;   CARDIOVERSION N/A 03/23/2023   Procedure: CARDIOVERSION;  Surgeon: Raford Riggs, MD;  Location: [facility] INVASIVE  CV LAB;  Service: Cardiovascular;  Laterality: N/A;   CHOLECYSTECTOMY     ~ 6 yrs ago   COLONOSCOPY  2009   Dr.Chesley   CYSTECTOMY  09/04/2019   Dr.Gray   GALLBLADDER SURGERY  03/10/2015   Dr.Amid ( [place]  )   POLYPECTOMY     12 polyps removed at [facility]    VENTRAL HERNIA REPAIR  2016    OB History   No obstetric history on file.      Home Medications    Prior to Admission medications   Medication Sig Start Date End Date Taking? Authorizing Provider  acetaminophen  (TYLENOL ) 650 MG CR tablet Take 1,300 mg by mouth in the morning and at bedtime.    [provider]  amiodarone  (PACERONE ) 200 MG tablet Take 1 tablet  (200 mg total) by mouth daily. Patient taking differently: Take 200 mg by mouth 2 (two) times daily. 03/19/23   Fenton, Clint R, PA  apixaban  (ELIQUIS ) 5 MG TABS tablet TAKE 1 TABLET BY MOUTH TWICE DAILY 03/30/23   Ladona Heinz, MD  Garlic 1000 MG CAPS Take 1,000 mg by mouth in the morning.    [provider]  loratadine (CLARITIN) 10 MG tablet Take 10 mg by mouth daily as needed for allergies or rhinitis.    [provider]  metoprolol  succinate (TOPROL -XL) 25 MG 24 hr tablet Take 1 tablet (25 mg total) by mouth in the morning. Take with or immediately following a meal. Patient taking differently: Take 50 mg by mouth in the morning. Take with or immediately following a meal. 02/01/23   Kate Lonni CROME, MD  Misc Natural Products (ELDERBERRY IMMUNE COMPLEX PO) Take 550 mg by mouth in the morning.    [provider]  Multiple Minerals (CALCIUM /MAGNESIUM/ZINC PO) Take 1 tablet by mouth daily. Vit d3    [provider]  Multiple Vitamins-Minerals (COMPLETE MULTIVITAMIN/MINERAL PO) Take 1 tablet by mouth in the morning. One a day    [provider]  Multiple Vitamins-Minerals (EMERGEN-C IMMUNE PO) Take 5 mLs by mouth daily as needed. With 8 oz of water    [provider]  NON FORMULARY Apply 1 application  topically as needed (ankle pain). Body balm with magnesium 12mg     [provider]  Omega-3 Krill Oil 500 MG CAPS Take 500 mg by mouth in the morning.    [provider]  OVER THE COUNTER MEDICATION Apply 1 Application topically daily as needed (Pain). veterinary liniment gel    [provider]  Psyllium 60.3 % POWD Take 5 mLs by mouth daily.    [provider]  rosuvastatin  (CRESTOR ) 10 MG tablet TAKE 1 TABLET(10 MG) BY MOUTH DAILY Patient taking differently: Take 10 mg by mouth at bedtime. 11/24/22   Caro Harlene POUR, NP  spironolactone  (ALDACTONE ) 25 MG tablet TAKE 1 TABLET(25 MG) BY MOUTH EVERY MORNING  11/25/22   Ladona Heinz, MD  valsartan -hydrochlorothiazide  (DIOVAN -HCT) 320-25 MG tablet TAKE 1 TABLET BY MOUTH EVERY MORNING 12/21/22   Ladona Heinz, MD  vitamin B-12 (CYANOCOBALAMIN) 1000 MCG tablet Take 1,000 mcg by mouth in the morning.    [provider]    Family History Family History  Problem Relation Age of Onset   Breast cancer Mother    Alzheimer's disease Mother    Dementia Mother    Parkinson's disease Mother    Colon polyps Mother    Stroke Father    Dementia Father    Breast cancer Sister  Allergies Daughter    Allergies Daughter    Allergies Son    Colon cancer Neg Hx    Esophageal cancer Neg Hx    Stomach cancer Neg Hx    Rectal cancer Neg Hx     Social History Social History   Tobacco Use   Smoking status: Never   Smokeless tobacco: Never   Tobacco comments:    Never smoked 01/05/23  Vaping Use   Vaping status: Never Used  Substance Use Topics   Alcohol use: Yes    Alcohol/week: 1.0 standard drink of alcohol    Types: 1 Glasses of wine per week    Comment: occasionally   Drug use: Never     Allergies   Amlodipine    Review of Systems Review of Systems  Eyes:  Negative for visual disturbance.  Musculoskeletal:  Positive for arthralgias.  Neurological:  Negative for headaches.  Psychiatric/Behavioral:  Negative for confusion.      Physical Exam Triage Vital Signs ED Triage Vitals  Encounter Vitals Group     BP 04/16/23 1117 (!) 145/73     Systolic BP Percentile --      Diastolic BP Percentile --      Pulse Rate 04/16/23 1117 (!) 55     Resp 04/16/23 1117 18     Temp 04/16/23 1117 98.2 F (36.8 C)     Temp Source 04/16/23 1117 Oral     SpO2 04/16/23 1117 97 %     Weight --      Height --      Head Circumference --      Peak Flow --      Pain Score 04/16/23 1116 10     Pain Loc --      Pain Education --      Exclude from Growth Chart --    No data found.  Updated Vital Signs BP (!) 145/73 (BP Location: Right Arm)    Pulse (!) 55   Temp 98.2 F (36.8 C) (Oral)   Resp 18   SpO2 97%   Visual Acuity Right Eye Distance:   Left Eye Distance:   Bilateral Distance:    Right Eye Near:   Left Eye Near:    Bilateral Near:     Physical Exam Vitals reviewed.  Constitutional:      General: She is awake.     Appearance: Normal appearance.  HENT:     Head: Normocephalic and atraumatic.  Pulmonary:     Effort: Pulmonary effort is normal.  Musculoskeletal:     Comments: She is able to lift left arm to approx level of breast but no further with active ROM. Passive ROM is limited due to pain. No obvious clavicular deformity  Neurological:     General: No focal deficit present.     Mental Status: She is alert and oriented to person, place, and time.     GCS: GCS eye subscore is 4. GCS verbal subscore is 5. GCS motor subscore is 6.     Cranial Nerves: No cranial nerve deficit, dysarthria or facial asymmetry.     Motor: No weakness, tremor, atrophy or abnormal muscle tone.     Comments: Comments: MENTAL STATUS: AAOx3, memory intact, fund of knowledge appropriate   LANG/SPEECH: Naming and repetition intact, fluent, no dysarthria, follows 3-step commands, answers questions appropriately     CRANIAL NERVES:   II: Pupils equal and reactive, no RAPD   III, IV, VI: EOM intact, no gaze preference or  deviation, no nystagmus.   V: normal sensation in V1, V2, and V3 segments bilaterally   VII: no asymmetry, no nasolabial fold flattening   VIII: normal hearing to speech   IX, X: normal palatal elevation, no uvular deviation   XI: 5/5 head turn and 5/5 shoulder shrug bilaterally   XII: midline tongue protrusion   MOTOR:  5/5 bilateral grip strength 5/5 strength dorsiflexion/plantarflexion b/l   COORD: Normal heel to shin, no tremor, no dysmetria   STATION: normal stance, no truncal ataxia   GAIT: Normal; patient able to tip-toe, heel-walk.   Psychiatric:        Behavior: Behavior is cooperative.       UC Treatments / Results  Labs (all labs ordered are listed, but only abnormal results are displayed) Labs Reviewed - No data to display  EKG   Radiology DG Shoulder Left Result Date: 04/16/2023 CLINICAL DATA:  Fall.  Left shoulder injury and pain. EXAM: LEFT SHOULDER - 2+ VIEW COMPARISON:  None Available. FINDINGS: There is no evidence of fracture or dislocation. Mild to moderate degenerative changes are seen involving the glenohumeral and acromioclavicular joints. IMPRESSION: No acute findings. 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 Electronically Signed   By: Norleen DELENA Kil M.D.   On: 04/16/2023 13:22    Procedures Procedures (including critical care time)  Medications Ordered in UC Medications  acetaminophen  (TYLENOL ) tablet 650 mg (650 mg Oral Given 04/16/23 1325)    Initial Impression / Assessment and Plan / UC Course  I have reviewed the triage vital signs and the nursing notes.  Pertinent labs & imaging results that were available during my care of the patient were reviewed by me and considered in my medical decision making (see chart for details).      Final Clinical Impressions(s) / UC Diagnoses   Final diagnoses:  Fall in home, initial encounter  Acute pain of left shoulder due to trauma   Acute, new concern Patient presents today with left shoulder pain following a fall in her home.  She denies hitting her head or loss of consciousness.  She does endorse some lightheadedness and nausea as well as vomiting right after the fall.  Her husband states that she did not lose consciousness after the fall as she was speaking to him the whole time.  Neurologic exam is overall normal. She does have limited range of motion of her left shoulder.  She did not appear able to abduct, flex or extend at this time due to pain.  Will provide acetaminophen  650 mg in the clinic today to assist with pain.  Reviewed left shoulder x-ray results which do not show  acute fracture or dislocation today.  At this time suspect a muscular or soft tissue injury but cannot rule out rotator cuff injury at this time.  Recommend home measures for now with Tylenol  for pain as well as Voltaren gel or topical IcyHot per her preference.  If symptoms are not improving with gentle massage and warm compresses, light stretches recommend follow-up with orthopedics for further evaluation.     Discharge Instructions      At this time your x-ray did not show any acute fractures or dislocations.  At this time I think that you likely have a soft tissue injury or muscular strain.  You can use Tylenol  as needed for pain management as well as topical IcyHot or Voltaren gel.  You can also use warm compresses.  I have sent in some stretches for you to do  to help prevent stiffness.  These may her but should not be excruciatingly painful.  If they are very painful please stop them and try again later.  If you continue to have reduced range of motion, intense pain please follow-up with orthopedics or your PCP for further management     ED Prescriptions   None    PDMP not reviewed this encounter.   Marylene Rocky BRAVO, PA-C 04/16/23 1336

## 2023-04-16 NOTE — ED Notes (Addendum)
 Called no answer on phone number

## 2023-04-16 NOTE — Discharge Instructions (Signed)
 At this time your x-ray did not show any acute fractures or dislocations.  At this time I think that you likely have a soft tissue injury or muscular strain.  You can use Tylenol  as needed for pain management as well as topical IcyHot or Voltaren gel.  You can also use warm compresses.  I have sent in some stretches for you to do to help prevent stiffness.  These may her but should not be excruciatingly painful.  If they are very painful please stop them and try again later.  If you continue to have reduced range of motion, intense pain please follow-up with orthopedics or your PCP for further management

## 2023-04-21 ENCOUNTER — Ambulatory Visit
Admission: RE | Admit: 2023-04-21 | Discharge: 2023-04-21 | Disposition: A | Discharge status: Home / Self Care | Payer: Medicare Other | Source: Ambulatory Visit | Attending: Nurse Practitioner | Admitting: Nurse Practitioner

## 2023-04-21 DIAGNOSIS — Z1231 Encounter for screening mammogram for malignant neoplasm of breast: Secondary | ICD-10-CM

## 2023-04-29 ENCOUNTER — Ambulatory Visit: Payer: Medicare Other | Admitting: Nurse Practitioner

## 2023-04-29 DIAGNOSIS — Z0000 Encounter for general adult medical examination without abnormal findings: Secondary | ICD-10-CM | POA: Diagnosis not present

## 2023-04-29 NOTE — Patient Instructions (Signed)
  Terry Koch , Thank you for taking time to come for your Medicare Wellness Visit. I appreciate your ongoing commitment to your health goals. Please review the following plan we discussed and let me know if I can assist you in the future.   Due for pneumonia vaccine, flu vaccine (can get at our office or pharmacy and TDAP (to get at local pharmacy)   This is a list of the screening recommended for you and due dates:  Health Maintenance  Topic Date Due   Pneumonia Vaccine (1 of 2 - PCV) Never done   DTaP/Tdap/Td vaccine (1 - Tdap) Never done   Colon Cancer Screening  10/04/2022   Flu Shot  10/08/2022   COVID-19 Vaccine (4 - 2024-25 season) 11/08/2022   Medicare Annual Wellness Visit  04/28/2024   DEXA scan (bone density measurement)  03/25/2028   Hepatitis C Screening  Completed   Zoster (Shingles) Vaccine  Completed   HPV Vaccine  Aged Out

## 2023-04-29 NOTE — Progress Notes (Signed)
 Subjective:   Terry Koch is a 78 y.o. female who presents for Medicare Annual (Subsequent) preventive examination.  Visit Complete: Virtual I connected with  Terry Koch on 04/29/23 by a video and audio enabled telemedicine application and verified that I am speaking with the correct person using two identifiers.  Patient Location: Home  Provider Location: Office/Clinic  I discussed the limitations of evaluation and management by telemedicine. The patient expressed understanding and agreed to proceed.  Vital Signs: Because this visit was a virtual/telehealth visit, some criteria may be missing or patient reported. Any vitals not documented were not able to be obtained and vitals that have been documented are patient reported.  Cardiac Risk Factors include: hypertension;dyslipidemia;advanced age (>44men, >13 women);sedentary lifestyle;obesity (BMI >30kg/m2)     Objective:    Today's Vitals   04/29/23 1125  PainSc: 5    There is no height or weight on file to calculate BMI.     03/23/2023   10:01 AM 02/01/2023    9:34 AM 10/16/2022    3:35 PM 06/10/2022    9:57 AM 04/20/2022    1:32 PM 04/01/2022    2:28 PM 03/23/2022    9:14 AM  Advanced Directives  Does Patient Have a Medical Advance Directive? No No Yes Yes Yes Yes No  Type of Advance Directive   Out of facility DNR (pink MOST or yellow form) Healthcare Power of [place];Living will Out of facility DNR (pink MOST or yellow form) Out of facility DNR (pink MOST or yellow form)   Does patient want to make changes to medical advance directive?   No - Patient declined   No - Patient declined   Copy of Healthcare Power of Attorney in Chart?    No - copy requested     Would patient like information on creating a medical advance directive?       No - Patient declined  Pre-existing out of facility DNR order (yellow form or pink MOST form)   Pink MOST form placed in chart (order not valid for inpatient use)         Current Medications (verified) Outpatient Encounter Medications as of 04/29/2023  Medication Sig   acetaminophen (TYLENOL) 650 MG CR tablet Take 1,300 mg by mouth in the morning and at bedtime.   amiodarone (PACERONE) 200 MG tablet Take 1 tablet (200 mg total) by mouth daily. (Patient taking differently: Take 200 mg by mouth 2 (two) times daily.)   apixaban (ELIQUIS) 5 MG TABS tablet TAKE 1 TABLET BY MOUTH TWICE DAILY   Garlic 1000 MG CAPS Take 1,000 mg by mouth in the morning.   loratadine (CLARITIN) 10 MG tablet Take 10 mg by mouth daily as needed for allergies or rhinitis.   metoprolol succinate (TOPROL-XL) 25 MG 24 hr tablet Take 1 tablet (25 mg total) by mouth in the morning. Take with or immediately following a meal. (Patient taking differently: Take 50 mg by mouth in the morning. Take with or immediately following a meal.)   Misc Natural Products (ELDERBERRY IMMUNE COMPLEX PO) Take 550 mg by mouth in the morning.   Multiple Minerals (CALCIUM/MAGNESIUM/ZINC PO) Take 1 tablet by mouth daily. Vit d3   Multiple Vitamins-Minerals (COMPLETE MULTIVITAMIN/MINERAL PO) Take 1 tablet by mouth in the morning. One a day   Multiple Vitamins-Minerals (EMERGEN-C IMMUNE PO) Take 5 mLs by mouth daily as needed. With 8 oz of water   NON FORMULARY Apply 1 application  topically as needed (ankle pain). Body  balm with magnesium 12mg    Omega-3 Krill Oil 500 MG CAPS Take 500 mg by mouth in the morning.   OVER THE COUNTER MEDICATION Apply 1 Application topically daily as needed (Pain). veterinary liniment gel   Psyllium 60.3 % POWD Take 5 mLs by mouth daily.   rosuvastatin (CRESTOR) 10 MG tablet TAKE 1 TABLET(10 MG) BY MOUTH DAILY (Patient taking differently: Take 10 mg by mouth at bedtime.)   spironolactone (ALDACTONE) 25 MG tablet TAKE 1 TABLET(25 MG) BY MOUTH EVERY MORNING   valsartan-hydrochlorothiazide (DIOVAN-HCT) 320-25 MG tablet TAKE 1 TABLET BY MOUTH EVERY MORNING   vitamin B-12 (CYANOCOBALAMIN) 1000  MCG tablet Take 1,000 mcg by mouth in the morning.   No facility-administered encounter medications on file as of 04/29/2023.    Allergies (verified) Amlodipine   History: Past Medical History:  Diagnosis Date   Arthritis    self report   Bone fracture    Right Foot   H/O hernia repair    History of hysterectomy    History of mammogram    Dr.Elizabeth Pascal Lux   History of MRI    Dr.Elizabeth Pascal Lux.   History of removal of skin mole 03/09/1997   Hyperlipidemia    Hypertension    Persistent atrial fibrillation (HCC)    Scalp hematoma    Wears glasses    Past Surgical History:  Procedure Laterality Date   ABDOMINAL HYSTERECTOMY     BREAST BIOPSY  03/09/1997   Dr.Finder   BREAST BIOPSY Right 11/07/2019   FIBROADENOMA WITH CALCIFICATIONS   BREAST BIOPSY Left 11/07/2019   FIBROADENOMA WITH CALCIFICATIONS   CARDIOVERSION N/A 12/25/2019   Procedure: CARDIOVERSION;  Surgeon: Yates Decamp, MD;  Location: [facility] ENDOSCOPY;  Service: Cardiovascular;  Laterality: N/A;   CARDIOVERSION N/A 02/06/2020   Procedure: CARDIOVERSION;  Surgeon: Yates Decamp, MD;  Location: [facility] ENDOSCOPY;  Service: Cardiovascular;  Laterality: N/A;   CARDIOVERSION N/A 06/10/2022   Procedure: CARDIOVERSION;  Surgeon: Tessa Lerner, DO;  Location: [facility] ENDOSCOPY;  Service: Cardiovascular;  Laterality: N/A;   CARDIOVERSION N/A 02/01/2023   Procedure: CARDIOVERSION (CATH LAB);  Surgeon: Little Ishikawa, MD;  Location: [facility] INVASIVE CV LAB;  Service: Cardiovascular;  Laterality: N/A;   CARDIOVERSION N/A 03/23/2023   Procedure: CARDIOVERSION;  Surgeon: Chilton Si, MD;  Location: [facility] INVASIVE CV LAB;  Service: Cardiovascular;  Laterality: N/A;   CHOLECYSTECTOMY     ~ 6 yrs ago   COLONOSCOPY  2009   Dr.Chesley   CYSTECTOMY  09/04/2019   Dr.Gray   GALLBLADDER SURGERY  03/10/2015   Dr.Amid ( [place] )   POLYPECTOMY     12 polyps removed at [facility]    VENTRAL HERNIA REPAIR  2016   Family History  Problem  Relation Age of Onset   Breast cancer Mother    Alzheimer's disease Mother    Dementia Mother    Parkinson's disease Mother    Colon polyps Mother    Stroke Father    Dementia Father    Breast cancer Sister    Allergies Daughter    Allergies Daughter    Allergies Son    Colon cancer Neg Hx    Esophageal cancer Neg Hx    Stomach cancer Neg Hx    Rectal cancer Neg Hx    Social History   Socioeconomic History   Marital status: Married    Spouse name: Maurine Minister    Number of children: 3   Years of education: 20   Highest education level: Not on file  Occupational History   Not on file  Tobacco Use   Smoking status: Never   Smokeless tobacco: Never   Tobacco comments:    Never smoked 01/05/23  Vaping Use   Vaping status: Never Used  Substance and Sexual Activity   Alcohol use: Yes    Alcohol/week: 1.0 standard drink of alcohol    Types: 1 Glasses of wine per week    Comment: occasionally   Drug use: Never   Sexual activity: Not Currently  Other Topics Concern   Not on file  Social History Narrative   Tobacco use, amount per day now: N/A   Past tobacco use, amount per day: N/A   How many years did you use tobacco: N/A   Alcohol use (drinks per week): Very Limited.   Diet: Regular Vegetables, Meat, Fruits, Milk, Water, and Desserts.   Do you drink/eat things with caffeine: Yes   Marital status:  Married                                What year were you married? 1969   Do you live in a house, apartment, assisted living, condo, trailer, etc.? House.   Is it one or more stories? [place] ( 1 story )   How many persons live in your home? 2   Do you have pets in your home?( please list) No.    Highest Level of education completed: Graduate MED   Current or past profession: Education   Do you exercise?  N/A                                   Type and how often?   Do you have a living will? Yes   Do you have a DNR form?   No                                If not, do you want to  discuss one?   Do you have signed POA/HPOA forms?  No                      If so, please bring to you appointment    Do you have any difficulty bathing or dressing yourself? No.   Do you have difficulty preparing food or eating? No.   Do you have difficulty managing your medications? No.   Do you have any difficulty managing your finances? No.   Do you have any difficulty affording your medications? No.      Social Drivers of Corporate investment banker Strain: Not on file  Food Insecurity: Not on file  Transportation Needs: Not on file  Physical Activity: Not on file  Stress: Not on file  Social Connections: Not on file    Tobacco Counseling Counseling given: Not Answered Tobacco comments: Never smoked 01/05/23   Clinical Intake:     Pain : 0-10 Pain Score: 5  Pain Type: Acute pain Pain Location: Shoulder Pain Orientation: Left     BMI - recorded: 43 Nutritional Status: BMI > 30  Obese  How often do you need to have someone help you when you read instructions, pamphlets, or other written materials from your doctor or pharmacy?: 1 - Never         Activities  of Daily Living    04/29/2023   11:08 AM 03/23/2023   10:01 AM  In your present state of health, do you have any difficulty performing the following activities:  Hearing? 0 0  Vision? 0 0  Difficulty concentrating or making decisions? 0 0  Walking or climbing stairs? 0   Dressing or bathing? 0   Doing errands, shopping? 0   Preparing Food and eating ? N   Using the Toilet? N   In the past six months, have you accidently leaked urine? N   Do you have problems with loss of bowel control? N   Managing your Medications? N   Managing your Finances? N   Housekeeping or managing your Housekeeping? N     Patient Care Team: Sharon Seller, NP as PCP - General (Geriatric Medicine) Yates Decamp, MD as PCP - Cardiology (Cardiology) Regan Lemming, MD as PCP - Electrophysiology  (Cardiology)  Indicate any recent Medical Services you may have received from other than [facility] providers in the past year (date may be approximate).     Assessment:   This is a routine wellness examination for Terry Koch.  Hearing/Vision screen Vision Screening - Comments:: Dr. Deirdre Evener Office Last Eye Exam: 04/2023   Goals Addressed   None    Depression Screen    04/29/2023   11:12 AM 04/20/2022    1:34 PM 03/23/2022    9:14 AM 12/06/2019    1:07 PM 09/01/2019    2:27 PM 07/17/2019    8:39 AM  PHQ 2/9 Scores  PHQ - 2 Score 0 0 0 0 0 0  PHQ- 9 Score  0        Fall Risk    04/29/2023   11:11 AM 10/16/2022    3:34 PM 04/20/2022    1:33 PM 04/01/2022    2:28 PM 03/23/2022    9:13 AM  Fall Risk   Falls in the past year? 1 1 0 0 0  Number falls in past yr: 0 1 0 0 0  Injury with Fall? 0 0 0 0 0  Risk for fall due to : Impaired balance/gait History of fall(s) No Fall Risks No Fall Risks History of fall(s)  Follow up Falls evaluation completed Falls evaluation completed Falls evaluation completed Falls evaluation completed Falls evaluation completed    MEDICARE RISK AT HOME: Medicare Risk at Home Any stairs in or around the home?: Yes If so, are there any without handrails?: Yes Home free of loose throw rugs in walkways, pet beds, electrical cords, etc?: Yes Adequate lighting in your home to reduce risk of falls?: Yes Life alert?: No Use of a cane, walker or w/c?: No Grab bars in the bathroom?: Yes Shower chair or bench in shower?: Yes Elevated toilet seat or a handicapped toilet?: Yes  TIMED UP AND GO:  Was the test performed?  No    Cognitive Function:    07/17/2019    8:41 AM  MMSE - Mini Mental State Exam  Orientation to time 4  Orientation to Place 5  Registration 3  Attention/ Calculation 5  Recall 1  Language- name 2 objects 2  Language- repeat 1  Language- follow 3 step command 3  Language- read & follow direction 1  Write a sentence 1  Copy design 1   Total score 27        04/29/2023   11:14 AM 04/20/2022    1:34 PM  6CIT Screen  What Year? 0 points 0  points  What month? 0 points 0 points  What time? 0 points 0 points  Count back from 20 0 points 2 points  Months in reverse 0 points 0 points  Repeat phrase 0 points 0 points  Total Score 0 points 2 points    Immunizations Immunization History  Administered Date(s) Administered   Fluad Quad(high Dose 65+) 12/06/2019, 11/14/2021   PFIZER(Purple Top)SARS-COV-2 Vaccination 03/31/2019, 04/21/2019, 01/04/2020   Zoster Recombinant(Shingrix) 08/10/2019, 10/10/2019    TDAP status: Due, Education has been provided regarding the importance of this vaccine. Advised may receive this vaccine at local pharmacy or Health Dept. Aware to provide a copy of the vaccination record if obtained from local pharmacy or Health Dept. Verbalized acceptance and understanding.  Flu Vaccine status: Due, Education has been provided regarding the importance of this vaccine. Advised may receive this vaccine at local pharmacy or Health Dept. Aware to provide a copy of the vaccination record if obtained from local pharmacy or Health Dept. Verbalized acceptance and understanding.  Pneumococcal vaccine status: Due, Education has been provided regarding the importance of this vaccine. Advised may receive this vaccine at local pharmacy or Health Dept. Aware to provide a copy of the vaccination record if obtained from local pharmacy or Health Dept. Verbalized acceptance and understanding.  Covid-19 vaccine status: Information provided on how to obtain vaccines.   Qualifies for Shingles Vaccine? Yes   Zostavax completed No   Shingrix Completed?: Yes  Screening Tests Health Maintenance  Topic Date Due   Pneumonia Vaccine 84+ Years old (1 of 2 - PCV) Never done   DTaP/Tdap/Td (1 - Tdap) Never done   Colonoscopy  10/04/2022   INFLUENZA VACCINE  10/08/2022   COVID-19 Vaccine (4 - 2024-25 season) 11/08/2022    Medicare Annual Wellness (AWV)  04/28/2024   DEXA SCAN  03/25/2028   Hepatitis C Screening  Completed   Zoster Vaccines- Shingrix  Completed   HPV VACCINES  Aged Out    Health Maintenance  Health Maintenance Due  Topic Date Due   Pneumonia Vaccine 75+ Years old (1 of 2 - PCV) Never done   DTaP/Tdap/Td (1 - Tdap) Never done   Colonoscopy  10/04/2022   INFLUENZA VACCINE  10/08/2022   COVID-19 Vaccine (4 - 2024-25 season) 11/08/2022    Colorectal cancer screening: Type of screening: Colonoscopy. Completed 2021. Repeat every 3 years  Mammogram status: No longer required due to age.  Bone Density status: Completed 03/2023. Results reflect: Bone density results: NORMAL. Repeat every 5 years.  Lung Cancer Screening: (Low Dose CT Chest recommended if Age 55-80 years, 20 pack-year currently smoking OR have quit w/in 15years.) does not qualify.   Lung Cancer Screening Referral: ns  Additional Screening:  Hepatitis C Screening: does qualify; Completed   Vision Screening: Recommended annual ophthalmology exams for early detection of glaucoma and other disorders of the eye. Is the patient up to date with their annual eye exam?  Yes  Who is the provider or what is the name of the office in which the patient attends annual eye exams? [facility] If pt is not established with a provider, would they like to be referred to a provider to establish care? No .   Dental Screening: Recommended annual dental exams for proper oral hygiene  Community Resource Referral / Chronic Care Management: CRR required this visit?  No   CCM required this visit?  No     Plan:     I have personally reviewed and noted the following  in the patient's chart:   Medical and social history Use of alcohol, tobacco or illicit drugs  Current medications and supplements including opioid prescriptions. Patient is not currently taking opioid prescriptions. Functional ability and status Nutritional status Physical  activity Advanced directives List of other physicians Hospitalizations, surgeries, and ER visits in previous 12 months Vitals Screenings to include cognitive, depression, and falls Referrals and appointments  In addition, I have reviewed and discussed with patient certain preventive protocols, quality metrics, and best practice recommendations. A written personalized care plan for preventive services as well as general preventive health recommendations were provided to patient.     Sharon Seller, NP   04/29/2023   After Visit Summary: (MyChart) Due to this being a telephonic visit, the after visit summary with patients personalized plan was offered to patient via MyChart

## 2023-04-29 NOTE — Progress Notes (Signed)
  This service is provided via telemedicine  No vital signs collected/recorded due to the encounter was a telemedicine visit.   Location of patient (ex: home, work[place]  Patient consents to a telephone visit:  Yes  Location of the provider (ex: office, home):  Office [place].   Name of any referring provider:  na  Names of all persons participating in the telemedicine service and their role in the encounter:  [facility], Patient, Nelda Severe, CMA, Abbey Chatters, NP  Time spent on call:  7:11

## 2023-05-11 ENCOUNTER — Encounter (INDEPENDENT_AMBULATORY_CARE_PROVIDER_SITE_OTHER): Payer: Self-pay

## 2023-05-18 ENCOUNTER — Other Ambulatory Visit (HOSPITAL_COMMUNITY): Payer: Medicare Other

## 2023-05-21 ENCOUNTER — Ambulatory Visit (HOSPITAL_COMMUNITY)
Admission: EM | Admit: 2023-05-21 | Discharge: 2023-05-21 | Disposition: A | Discharge status: Home / Self Care | Attending: Family Medicine | Admitting: Family Medicine

## 2023-05-21 ENCOUNTER — Encounter (HOSPITAL_COMMUNITY): Payer: Self-pay

## 2023-05-21 DIAGNOSIS — G8929 Other chronic pain: Secondary | ICD-10-CM | POA: Diagnosis not present

## 2023-05-21 DIAGNOSIS — M25512 Pain in left shoulder: Secondary | ICD-10-CM | POA: Diagnosis not present

## 2023-05-21 MED ORDER — GABAPENTIN 100 MG PO CAPS: ORAL_CAPSULE | ORAL | 0 refills | Status: DC

## 2023-05-21 NOTE — Discharge Instructions (Signed)
 You were seen today for continued left shoulder and arm pain.  I have sent out a medication to take at bed to see if helpful.  Start with 1 cap at bedtime for 2-3 nights, then increase to 2 caps if needed.  This may make you tried/drowsy.  Please discuss further with your primary care provider next week as you may need further treatment.

## 2023-05-21 NOTE — ED Provider Notes (Signed)
 [facility]    CSN: [id number] Arrival date & time: 05/21/23  1204      History   Chief Complaint Chief Complaint  Patient presents with   Fall   Shoulder Pain    HPI Terry Koch is a 78 y.o. female.    Fall  Shoulder Pain  Patient is here for continued pain after a fall at home.  She was seen here on 2/7 after a fall in her home.  She ell against a table and caught herself with her left arm/hand and lowered herself to the ground.   She did have an xray done at that time of the left shoulder which was negative for fracture.   She has had continued pain.  She is having pain from the left shoulder, down the left arm and into her fingers.  This pain is worse at night primarily.  She has a hard time sleeping as a result.  She has been using tylenol arthritis, icy hot, massage oil.        Past Medical History:  Diagnosis Date   Arthritis    self report   Bone fracture    Right Foot   H/O hernia repair    History of hysterectomy    History of mammogram    Dr.Elizabeth Pascal Lux   History of MRI    Dr.Elizabeth Pascal Lux.   History of removal of skin mole 03/09/1997   Hyperlipidemia    Hypertension    Persistent atrial fibrillation (HCC)    Scalp hematoma    Wears glasses     Patient Active Problem List   Diagnosis Date Noted   Encounter for monitoring amiodarone therapy 03/19/2023   Hypercoagulable state due to persistent atrial fibrillation (HCC) 01/05/2023   Arthritis    Hyperlipidemia    Hypertension    Persistent atrial fibrillation [facility])     Past Surgical History:  Procedure Laterality Date   ABDOMINAL HYSTERECTOMY     BREAST BIOPSY  03/09/1997   Dr.Finder   BREAST BIOPSY Right 11/07/2019   FIBROADENOMA WITH CALCIFICATIONS   BREAST BIOPSY Left 11/07/2019   FIBROADENOMA WITH CALCIFICATIONS   CARDIOVERSION N/A 12/25/2019   Procedure: CARDIOVERSION;  Surgeon: Yates Decamp, MD;  Location: [facility] ENDOSCOPY;  Service: Cardiovascular;   Laterality: N/A;   CARDIOVERSION N/A 02/06/2020   Procedure: CARDIOVERSION;  Surgeon: Yates Decamp, MD;  Location: [facility] ENDOSCOPY;  Service: Cardiovascular;  Laterality: N/A;   CARDIOVERSION N/A 06/10/2022   Procedure: CARDIOVERSION;  Surgeon: Tessa Lerner, DO;  Location: [facility] ENDOSCOPY;  Service: Cardiovascular;  Laterality: N/A;   CARDIOVERSION N/A 02/01/2023   Procedure: CARDIOVERSION (CATH LAB);  Surgeon: Little Ishikawa, MD;  Location: [facility] INVASIVE CV LAB;  Service: Cardiovascular;  Laterality: N/A;   CARDIOVERSION N/A 03/23/2023   Procedure: CARDIOVERSION;  Surgeon: Chilton Si, MD;  Location: [facility] INVASIVE CV LAB;  Service: Cardiovascular;  Laterality: N/A;   CHOLECYSTECTOMY     ~ 6 yrs ago   COLONOSCOPY  2009   Dr.Chesley   CYSTECTOMY  09/04/2019   Dr.Gray   GALLBLADDER SURGERY  03/10/2015   Dr.Amid ( [place] )   POLYPECTOMY     12 polyps removed at [facility]    VENTRAL HERNIA REPAIR  2016    OB History   No obstetric history on file.      Home Medications    Prior to Admission medications   Medication Sig Start Date End Date Taking? Authorizing Provider  acetaminophen (TYLENOL) 650 MG CR tablet Take  1,300 mg by mouth in the morning and at bedtime.    [provider]  amiodarone (PACERONE) 200 MG tablet Take 1 tablet (200 mg total) by mouth daily. Patient taking differently: Take 200 mg by mouth 2 (two) times daily. 03/19/23   Fenton, Clint R, PA  apixaban (ELIQUIS) 5 MG TABS tablet TAKE 1 TABLET BY MOUTH TWICE DAILY 03/30/23   Yates Decamp, MD  Garlic 1000 MG CAPS Take 1,000 mg by mouth in the morning.    [provider]  loratadine (CLARITIN) 10 MG tablet Take 10 mg by mouth daily as needed for allergies or rhinitis.    [provider]  metoprolol succinate (TOPROL-XL) 25 MG 24 hr tablet Take 1 tablet (25 mg total) by mouth in the morning. Take with or immediately following a meal. Patient taking differently: Take 50 mg by mouth in the  morning. Take with or immediately following a meal. 02/01/23   Little Ishikawa, MD  Misc Natural Products (ELDERBERRY IMMUNE COMPLEX PO) Take 550 mg by mouth in the morning.    [provider]  Multiple Minerals (CALCIUM/MAGNESIUM/ZINC PO) Take 1 tablet by mouth daily. Vit d3    [provider]  Multiple Vitamins-Minerals (COMPLETE MULTIVITAMIN/MINERAL PO) Take 1 tablet by mouth in the morning. One a day    [provider]  Multiple Vitamins-Minerals (EMERGEN-C IMMUNE PO) Take 5 mLs by mouth daily as needed. With 8 oz of water    [provider]  NON FORMULARY Apply 1 application  topically as needed (ankle pain). Body balm with magnesium 12mg     [provider]  Omega-3 Krill Oil 500 MG CAPS Take 500 mg by mouth in the morning.    [provider]  OVER THE COUNTER MEDICATION Apply 1 Application topically daily as needed (Pain). veterinary liniment gel    [provider]  Psyllium 60.3 % POWD Take 5 mLs by mouth daily.    [provider]  rosuvastatin (CRESTOR) 10 MG tablet TAKE 1 TABLET(10 MG) BY MOUTH DAILY Patient taking differently: Take 10 mg by mouth at bedtime. 11/24/22   Sharon Seller, NP  spironolactone (ALDACTONE) 25 MG tablet TAKE 1 TABLET(25 MG) BY MOUTH EVERY MORNING 11/25/22   Yates Decamp, MD  valsartan-hydrochlorothiazide (DIOVAN-HCT) 320-25 MG tablet TAKE 1 TABLET BY MOUTH EVERY MORNING 12/21/22   Yates Decamp, MD  vitamin B-12 (CYANOCOBALAMIN) 1000 MCG tablet Take 1,000 mcg by mouth in the morning.    [provider]    Family History Family History  Problem Relation Age of Onset   Breast cancer Mother    Alzheimer's disease Mother    Dementia Mother    Parkinson's disease Mother    Colon polyps Mother    Stroke Father    Dementia Father    Breast cancer Sister    Allergies Daughter    Allergies Daughter    Allergies Son    Colon cancer Neg Hx    Esophageal cancer Neg Hx     Stomach cancer Neg Hx    Rectal cancer Neg Hx     Social History Social History   Tobacco Use   Smoking status: Never   Smokeless tobacco: Never   Tobacco comments:    Never smoked 01/05/23  Vaping Use   Vaping status: Never Used  Substance Use Topics   Alcohol use: Yes    Alcohol/week: 1.0 standard drink of alcohol    Types: 1 Glasses of wine per week  Comment: occasionally   Drug use: Never     Allergies   Amlodipine   Review of Systems Review of Systems  Constitutional: Negative.   HENT: Negative.    Respiratory: Negative.    Cardiovascular: Negative.   Gastrointestinal: Negative.   Musculoskeletal:  Positive for arthralgias.     Physical Exam Triage Vital Signs ED Triage Vitals [05/21/23 1326]  Encounter Vitals Group     BP      Systolic BP Percentile      Diastolic BP Percentile      Pulse      Resp      Temp      Temp src      SpO2      Weight      Height      Head Circumference      Peak Flow      Pain Score 8     Pain Loc      Pain Education      Exclude from Growth Chart    No data found.  Updated Vital Signs There were no vitals taken for this visit.  Visual Acuity Right Eye Distance:   Left Eye Distance:   Bilateral Distance:    Right Eye Near:   Left Eye Near:    Bilateral Near:     Physical Exam Constitutional:      Appearance: Normal appearance. She is normal weight.  Musculoskeletal:     Comments: No spinous tenderness;  +TTP across the mid back/scapular areas, left greater than right;  No TTP to the anterior left chest or shoulder;  no ttp to the left arm;  Pain and limited rom to the left shoulder with lifting, as well as rotation  Neurological:     General: No focal deficit present.     Mental Status: She is alert.  Psychiatric:        Mood and Affect: Mood normal.      UC Treatments / Results  Labs (all labs ordered are listed, but only abnormal results are displayed) Labs Reviewed - No data to  display  EKG   Radiology No results found.  Procedures Procedures (including critical care time)  Medications Ordered in UC Medications - No data to display  Initial Impression / Assessment and Plan / UC Course  I have reviewed the triage vital signs and the nursing notes.  Pertinent labs & imaging results that were available during my care of the patient were reviewed by me and considered in my medical decision making (see chart for details).    Final Clinical Impressions(s) / UC Diagnoses   Final diagnoses:  Chronic left shoulder pain     Discharge Instructions      You were seen today for continued left shoulder and arm pain.  I have sent out a medication to take at bed to see if helpful.  Start with 1 cap at bedtime for 2-3 nights, then increase to 2 caps if needed.  This may make you tried/drowsy.  Please discuss further with your primary care provider next week as you may need further treatment.     ED Prescriptions     Medication Sig Dispense Auth. Provider   gabapentin (NEURONTIN) 100 MG capsule 1 cap po at bedtime x 3 nights, then increase to 2 caps if needed 30 capsule Jannifer Franklin, MD      PDMP not reviewed this encounter.   Jannifer Franklin, MD 05/21/23 1349

## 2023-05-21 NOTE — ED Triage Notes (Signed)
 Patient reports that she was seen on 04/16/23 for a fall and at that time she c/o left shoulder pain.  Patient states she is here today because she is still having pain in her left shoulder that radiates into her left arm to her fingers and is worse at night. Patient states the pain radiates across the posterior neck to the right shoulder as well.  Patient reports that she has been using Elemis massage oil, Tylenol, Icy Hot spray and cream.

## 2023-05-24 ENCOUNTER — Ambulatory Visit: Payer: Self-pay | Admitting: Nurse Practitioner

## 2023-05-24 ENCOUNTER — Ambulatory Visit (INDEPENDENT_AMBULATORY_CARE_PROVIDER_SITE_OTHER): Admitting: Nurse Practitioner

## 2023-05-24 ENCOUNTER — Encounter: Payer: Self-pay | Admitting: Nurse Practitioner

## 2023-05-24 VITALS — BP 134/80 | HR 47 | Temp 97.9°F | Ht 67.0 in | Wt 273.0 lb

## 2023-05-24 DIAGNOSIS — M79604 Pain in right leg: Secondary | ICD-10-CM

## 2023-05-24 DIAGNOSIS — M25512 Pain in left shoulder: Secondary | ICD-10-CM

## 2023-05-24 MED ORDER — PREDNISONE 10 MG (21) PO TBPK: ORAL_TABLET | ORAL | 0 refills | Status: DC

## 2023-05-24 NOTE — Telephone Encounter (Signed)
  Chief Complaint: R leg hip to ankle pain, on outside of the leg Symptoms: pain Frequency: 04/15/23 Pertinent Negatives: Patient denies inability to walk, SOB, CP, difficulty breathing,  Disposition: [] ED /[] Urgent Care (no appt availability in office) / [x] Appointment(In office/virtual)/ []  [facility]/ [] Home Care/ [] Refused Recommended Disposition /[] [place] [facility]/ []  Follow-up with PCP Additional Notes: pt fell on 2/6. Pt states she was able to get up on her on, was evaluated the next day. Was told no fx. Pt states this week R leg started throbbing in pain, could not walk normally. Pt went to the UC at that time and was given gabapentin to help her sleep with the pain. Pt states that she could sleep with the medication. Pt states that the pain was not relieved. Pt states that the pain has eased since waking and moving this morning.  Copied from CRM [id number]. Topic: Clinical - Red Word Triage >> May 24, 2023 10:10 AM Corin V wrote: [facility] that prompted transfer to Nurse Triage: Patient had a fall the week of the Superbowl has severe pain in her right leg from her ankle to the thigh on the outside of the leg. Reason for Disposition  [1] SEVERE pain (e.g., excruciating, unable to do any normal activities) AND [2] not improved after 2 hours of pain medicine  Answer Assessment - Initial Assessment Questions 1. ONSET: "When did the pain start?"      2/6 after 2. LOCATION: "Where is the pain located?"      R leg from hip to ankle on the outside of the leg 3. PAIN: "How bad is the pain?"    (Scale 1-10; or mild, moderate, severe)   -  MILD (1-3): doesn't interfere with normal activities    -  MODERATE (4-7): interferes with normal activities (e.g., work or school) or awakens from sleep, limping    -  SEVERE (8-10): excruciating pain, unable to do any normal activities, unable to walk     9 4. WORK OR EXERCISE: "Has there been any recent work or exercise that involved this part  of the body?"      Fell on 2/6 5. CAUSE: "What do you think is causing the leg pain?"     Unsure, started when I fell 6. OTHER SYMPTOMS: "Do you have any other symptoms?" (e.g., chest pain, back pain, breathing difficulty, swelling, rash, fever, numbness, weakness)     denies  Protocols used: Leg Pain-A-AH

## 2023-05-24 NOTE — Patient Instructions (Addendum)
 Ice left shoulder   Continue tylenol twice daily   Stop gabapentin (not effective)

## 2023-05-24 NOTE — Telephone Encounter (Signed)
 Message routed to PCP [name], [name], NP as Terry Koch. Patient is being seen today.

## 2023-05-24 NOTE — Progress Notes (Signed)
 Careteam: Patient Care Team: Sharon Seller, NP as PCP - General (Geriatric Medicine) Yates Decamp, MD as PCP - Cardiology (Cardiology) Regan Lemming, MD as PCP - Electrophysiology (Cardiology)  PLACE OF SERVICE:  [facility]  Advanced Directive information    Allergies  Allergen Reactions   Amlodipine Other (See Comments)    Leg edema    Chief Complaint  Patient presents with   Leg Pain    Right leg, hip, and ankle pain related to fall. Patient questions if pain is coming from left shoulder pain      HPI: Patient is a 78 y.o. female who presents today for an acute visit for right sided leg pain from her right hip, down the back of her right leg to her right ankle.   She fell on April 15, 2023, about an hour after taking her medications for that day. She lost balance after running into a table and fell on her right side and used her left arm to brace herself and to pick herself up. After the fall she had a lot of pain to her left shoulder and went to the urgent care where she had an x ray done on her left shoulder. X ray was negative and she was advised to do some exercises and take tylenol as needed for pain.  About 5 days ago she noticed a sharp pain in her right shoulder, right hip and down the back of her right leg to right ankle. She describes her pain to be more discomfort and muscle tenderness vs. burning and tingling. She went back to the urgent care and was prescribed gabapentin to help with nerve pain and to help with sleep with no relief.   She is also taking tylenol for chronic right knee pain but it is not helping for this newer pain. She also tried many muscle rubs with little relief.   Her left shoulder pain has slightly improved with self exercises but not much. Her left shoulder is stiff and cannot be lifted up above 45 degrees.   She is traveling on Friday 3/21 and will be back on 3/30. She is due for her chronic medical appointment on 3/31, but  decided to come today due to pain and lack of sleep.   Review of Systems:  Review of Systems  Constitutional: Negative.   Gastrointestinal: Negative.   Musculoskeletal:  Positive for falls and joint pain.  Skin: Negative.   Neurological: Negative.   Psychiatric/Behavioral:  The patient is nervous/anxious.     Past Medical History:  Diagnosis Date   Arthritis    self report   Bone fracture    Right Foot   H/O hernia repair    History of hysterectomy    History of mammogram    Dr.Elizabeth Pascal Lux   History of MRI    Dr.Elizabeth Pascal Lux.   History of removal of skin mole 03/09/1997   Hyperlipidemia    Hypertension    Persistent atrial fibrillation (HCC)    Scalp hematoma    Wears glasses    Past Surgical History:  Procedure Laterality Date   ABDOMINAL HYSTERECTOMY     BREAST BIOPSY  03/09/1997   Dr.Finder   BREAST BIOPSY Right 11/07/2019   FIBROADENOMA WITH CALCIFICATIONS   BREAST BIOPSY Left 11/07/2019   FIBROADENOMA WITH CALCIFICATIONS   CARDIOVERSION N/A 12/25/2019   Procedure: CARDIOVERSION;  Surgeon: Yates Decamp, MD;  Location: [facility] ENDOSCOPY;  Service: Cardiovascular;  Laterality: N/A;   CARDIOVERSION N/A 02/06/2020  Procedure: CARDIOVERSION;  Surgeon: Yates Decamp, MD;  Location: [facility] ENDOSCOPY;  Service: Cardiovascular;  Laterality: N/A;   CARDIOVERSION N/A 06/10/2022   Procedure: CARDIOVERSION;  Surgeon: Tessa Lerner, DO;  Location: [facility] ENDOSCOPY;  Service: Cardiovascular;  Laterality: N/A;   CARDIOVERSION N/A 02/01/2023   Procedure: CARDIOVERSION (CATH LAB);  Surgeon: Little Ishikawa, MD;  Location: [facility] INVASIVE CV LAB;  Service: Cardiovascular;  Laterality: N/A;   CARDIOVERSION N/A 03/23/2023   Procedure: CARDIOVERSION;  Surgeon: Chilton Si, MD;  Location: [facility] INVASIVE CV LAB;  Service: Cardiovascular;  Laterality: N/A;   CHOLECYSTECTOMY     ~ 6 yrs ago   COLONOSCOPY  2009   Dr.Chesley   CYSTECTOMY  09/04/2019   Dr.Gray   GALLBLADDER SURGERY  03/10/2015    Dr.Amid ( [place] )   POLYPECTOMY     12 polyps removed at [facility]    VENTRAL HERNIA REPAIR  2016   Social History:   reports that she has never smoked. She has never used smokeless tobacco. She reports current alcohol use of about 1.0 standard drink of alcohol per week. She reports that she does not use drugs.  Family History  Problem Relation Age of Onset   Breast cancer Mother    Alzheimer's disease Mother    Dementia Mother    Parkinson's disease Mother    Colon polyps Mother    Stroke Father    Dementia Father    Breast cancer Sister    Allergies Daughter    Allergies Daughter    Allergies Son    Colon cancer Neg Hx    Esophageal cancer Neg Hx    Stomach cancer Neg Hx    Rectal cancer Neg Hx     Medications: Patient's Medications  New Prescriptions   No medications on file  Previous Medications   ACETAMINOPHEN (TYLENOL) 650 MG CR TABLET    Take 1,300 mg by mouth in the morning and at bedtime.   AMIODARONE (PACERONE) 200 MG TABLET    Take 1 tablet (200 mg total) by mouth daily.   APIXABAN (ELIQUIS) 5 MG TABS TABLET    TAKE 1 TABLET BY MOUTH TWICE DAILY   GABAPENTIN (NEURONTIN) 100 MG CAPSULE    1 cap po at bedtime x 3 nights, then increase to 2 caps if needed   GARLIC 1000 MG CAPS    Take 6,045 mg by mouth as needed. Periodically   LORATADINE (CLARITIN) 10 MG TABLET    Take 10 mg by mouth daily as needed for allergies or rhinitis.   METOPROLOL SUCCINATE (TOPROL-XL) 25 MG 24 HR TABLET    Take 1 tablet (25 mg total) by mouth in the morning. Take with or immediately following a meal.   MISC NATURAL PRODUCTS (ELDERBERRY IMMUNE COMPLEX PO)    Take 550 mg by mouth in the morning.   MULTIPLE MINERALS (CALCIUM/MAGNESIUM/ZINC PO)    Take 1 tablet by mouth daily. Vit d3   MULTIPLE VITAMINS-MINERALS (COMPLETE MULTIVITAMIN/MINERAL PO)    Take 1 tablet by mouth in the morning. One a day   MULTIPLE VITAMINS-MINERALS (EMERGEN-C IMMUNE PO)    Take 5 mLs by mouth daily as needed.  With 8 oz of water   MULTIPLE VITAMINS-MINERALS (EMERGEN-C VITAMIN C) CHEW    Chew 2 Chip by mouth daily.   NON FORMULARY    Apply 1 application  topically as needed (ankle pain). Body balm with magnesium 12mg    OMEGA-3 KRILL OIL 500 MG CAPS    Take 500 mg  by mouth in the morning.   OVER THE COUNTER MEDICATION    Apply 1 Application topically daily as needed (Pain). veterinary liniment gel   PSYLLIUM 60.3 % POWD    Take 5 mLs by mouth daily.   ROSUVASTATIN (CRESTOR) 10 MG TABLET    TAKE 1 TABLET(10 MG) BY MOUTH DAILY   SPIRONOLACTONE (ALDACTONE) 25 MG TABLET    TAKE 1 TABLET(25 MG) BY MOUTH EVERY MORNING   VALSARTAN-HYDROCHLOROTHIAZIDE (DIOVAN-HCT) 320-25 MG TABLET    TAKE 1 TABLET BY MOUTH EVERY MORNING   VITAMIN B-12 (CYANOCOBALAMIN) 1000 MCG TABLET    Take 1,000 mcg by mouth in the morning.  Modified Medications   No medications on file  Discontinued Medications   No medications on file    Physical Exam:  Vitals:   05/24/23 1500  BP: 134/80  Pulse: (!) 47  Temp: 97.9 F (36.6 C)  SpO2: 94%  Weight: 273 lb (123.8 kg)  Height: 5\' 7"  (1.702 m)   Body mass index is 42.76 kg/m. Wt Readings from Last 3 Encounters:  05/24/23 273 lb (123.8 kg)  04/09/23 275 lb 9.6 oz (125 kg)  04/06/23 274 lb (124.3 kg)    Physical Exam Vitals reviewed.  Constitutional:      Appearance: Normal appearance.  HENT:     Head: Normocephalic and atraumatic.  Cardiovascular:     Rate and Rhythm: Normal rate and regular rhythm.     Pulses: Normal pulses.     Heart sounds: Normal heart sounds.  Pulmonary:     Effort: Pulmonary effort is normal.     Breath sounds: Normal breath sounds.  Abdominal:     Palpations: Abdomen is soft.  Musculoskeletal:        General: Swelling, tenderness and signs of injury present.     Right shoulder: Tenderness present.     Left shoulder: Tenderness present. Decreased range of motion. Decreased strength.     Cervical back: Neck supple.     Right upper leg: No  tenderness.     Right knee: No tenderness.     Right lower leg: Tenderness present.     Right ankle: Tenderness present.     Comments: Tenderness worse on left shoulder vs right   Pt nontender on exam to low back/hip/upper leg  however abnormal gait due to pain  She reports tightness noted to upper hamstring on right with ROM.    Skin:    General: Skin is warm and dry.  Neurological:     Mental Status: She is alert and oriented to person, place, and time. Mental status is at baseline.  Psychiatric:        Mood and Affect: Mood normal.        Behavior: Behavior normal.     Labs reviewed: Basic Metabolic Panel: Recent Labs    10/16/22 1606 12/15/22 1704 01/27/23 1043 03/19/23 1030  NA 139 137 140 139  K 4.5 3.9 4.1 4.3  CL 103 101 105 105  CO2 27 24 27 23   GLUCOSE 86 96 97 132*  BUN 20 16 17 13   CREATININE 1.18* 1.13* 1.12* 1.16*  CALCIUM 10.1 9.2 9.4 9.3  TSH 0.64  --   --  1.344   Liver Function Tests: Recent Labs    10/16/22 1606 03/19/23 1030  AST 27 36  ALT 23 32  ALKPHOS  --  49  BILITOT 0.5 0.6  PROT 6.8 7.0  ALBUMIN  --  3.8   No results for input(s): "  LIPASE", "AMYLASE" in the last 8760 hours. No results for input(s): "AMMONIA" in the last 8760 hours. CBC: Recent Labs    10/16/22 1606 12/15/22 1704 01/27/23 1043 03/19/23 1030  WBC 5.9 6.0 4.5 5.4  NEUTROABS 3,481  --   --   --   HGB 12.3 12.5 12.7 12.3  HCT 39.0 39.3 40.6 40.1  MCV 86.9 88.1 87.7 87.9  PLT 234 195 207 238   Lipid Panel: Recent Labs    10/16/22 1606  CHOL 148  HDL 47*  LDLCALC 76  TRIG 409*  CHOLHDL 3.1   TSH: Recent Labs    10/16/22 1606 03/19/23 1030  TSH 0.64 1.344   A1C: Lab Results  Component Value Date   HGBA1C 6.1 (H) 10/16/2022     Assessment/Plan 1. Acute pain of left shoulder (Primary) With limited ROM? Rotator cuff tear vs inflammation  - AMB referral made to orthopedics for further evaluation and treatment  - predniSONE (STERAPRED UNI-PAK 21  TAB) 10 MG (21) TBPK tablet ordered; Use as directed  Dispense: 21 tablet; Refill: 0 - Ambulatory referral made to Physical Therapy - Apply ice to left shoulder and continue to do exercises to prevent stiffness - Continue tylenol as needed for pain relief - Discontinue gabapentin   2. Right leg pain -effecting gait but has full ROM without significant pain noted - Ambulatory referral made to Physical Therapy - predniSONE (STERAPRED UNI-PAK 21 TAB) 10 MG (21) TBPK tablet ordered; Use as directed  Dispense: 21 tablet; Refill: 0 - Continue tylenol as needed for pain relief    Keep previously scheduled appointment on 06/07/2023.  Lenord Fellers, RN DNP-AGPCNP Student -I personally was present during the history, physical exam and medical decision-making activities of this service and have verified that the service and findings are accurately documented in the student's note Caria Transue K. [facility]ag[facility] [phone]

## 2023-05-25 ENCOUNTER — Ambulatory Visit (HOSPITAL_COMMUNITY): Attending: Cardiology

## 2023-05-25 DIAGNOSIS — I480 Paroxysmal atrial fibrillation: Secondary | ICD-10-CM | POA: Insufficient documentation

## 2023-05-25 DIAGNOSIS — I5032 Chronic diastolic (congestive) heart failure: Secondary | ICD-10-CM | POA: Diagnosis present

## 2023-05-25 DIAGNOSIS — I361 Nonrheumatic tricuspid (valve) insufficiency: Secondary | ICD-10-CM

## 2023-05-25 LAB — ECHOCARDIOGRAM COMPLETE
Area-P 1/2: 4.02 cm2
S' Lateral: 3.1 cm

## 2023-05-26 ENCOUNTER — Encounter: Payer: Self-pay | Admitting: Cardiology

## 2023-05-26 NOTE — Progress Notes (Signed)
 Essentially normal echocardiogram for age, diastolic dysfunction related to hypertension, age and obesity which will improve with exercise and diet

## 2023-06-04 NOTE — Telephone Encounter (Signed)
 I placed call to patient to review results.  Left message to call office

## 2023-06-07 ENCOUNTER — Ambulatory Visit (INDEPENDENT_AMBULATORY_CARE_PROVIDER_SITE_OTHER): Payer: Medicare Other | Admitting: Nurse Practitioner

## 2023-06-07 ENCOUNTER — Encounter: Payer: Self-pay | Admitting: Nurse Practitioner

## 2023-06-07 VITALS — BP 138/80 | HR 61 | Temp 97.9°F | Ht 67.0 in | Wt 274.0 lb

## 2023-06-07 DIAGNOSIS — R739 Hyperglycemia, unspecified: Secondary | ICD-10-CM | POA: Diagnosis not present

## 2023-06-07 DIAGNOSIS — M79604 Pain in right leg: Secondary | ICD-10-CM

## 2023-06-07 DIAGNOSIS — E782 Mixed hyperlipidemia: Secondary | ICD-10-CM | POA: Diagnosis not present

## 2023-06-07 DIAGNOSIS — M25512 Pain in left shoulder: Secondary | ICD-10-CM

## 2023-06-07 DIAGNOSIS — I4819 Other persistent atrial fibrillation: Secondary | ICD-10-CM

## 2023-06-07 DIAGNOSIS — I10 Essential (primary) hypertension: Secondary | ICD-10-CM | POA: Diagnosis not present

## 2023-06-07 NOTE — Progress Notes (Unsigned)
 Careteam: Patient Care Team: Sharon Seller, NP as PCP - General (Geriatric Medicine) Yates Decamp, MD as PCP - Cardiology (Cardiology) Regan Lemming, MD as PCP - Electrophysiology (Cardiology)   PLACE OF SERVICE: [facility]  Advanced Directive information     Allergies  Allergen Reactions   Amlodipine Other (See Comments)    Leg edema     Chief Complaint  Patient presents with   Medical Management of Chronic Issues    6 month follow-up. Discussed need colonoscopy (patient is waiting to be cleared from cardiologist prior to getting procedure), flu vaccine, covid booster, pneumonia vaccine and td/tdap. Declined all vaccines Patient c/o low engry.       HPI: Patient is a 78 y.o. female presents for medical management of chronic issues. Had a fall earlier this month. Reports left shoulder is still hurting and feels like she has some limited ROM. Right leg also still bothering her. Pain is worse when she turns a certain way. Pain has slowly improved, using tylenol and topical analgesics. Reports pain is worse with inactivity. Has follow-up appointments for these issues in the next few weeks with ortho this week and physical therapy next week.  Reports she feels like she doesn't have the same energy that she used to. No changes in diet, eats lean meats and vegetables, taking vitamin B12, vitamin D. Drinks plenty of water. Eats 2 times per day. Reports she still is very active al day.   Followed by cardiology for her A. Fib. Currently in SR, rate controlled.   Taking fiber for constipation, was buying off brand and thinks this wasn't helping her much, plans to switch back to brand she was using before. No abdominal pain.   Review of Systems:  Review of Systems  Constitutional:  Positive for malaise/fatigue. Negative for chills and fever.  Respiratory:  Negative for cough and shortness of breath.   Cardiovascular:  Negative for chest pain, palpitations and leg swelling.   Gastrointestinal:  Positive for constipation. Negative for diarrhea, nausea and vomiting.  Genitourinary:  Negative for dysuria, frequency and urgency.  Musculoskeletal:  Positive for joint pain. Negative for myalgias.  Neurological:  Negative for dizziness, weakness and headaches.  Psychiatric/Behavioral:  Negative for depression. The patient is not nervous/anxious and does not have insomnia.      Past Medical History:  Diagnosis Date   Arthritis    self report   Bone fracture    Right Foot   H/O hernia repair    History of hysterectomy    History of mammogram    Dr.Elizabeth Pascal Lux   History of MRI    Dr.Elizabeth Pascal Lux.   History of removal of skin mole 03/09/1997   Hyperlipidemia    Hypertension    Persistent atrial fibrillation (HCC)    Scalp hematoma    Wears glasses     Past Surgical History:  Procedure Laterality Date   ABDOMINAL HYSTERECTOMY     BREAST BIOPSY  03/09/1997   Dr.Finder   BREAST BIOPSY Right 11/07/2019   FIBROADENOMA WITH CALCIFICATIONS   BREAST BIOPSY Left 11/07/2019   FIBROADENOMA WITH CALCIFICATIONS   CARDIOVERSION N/A 12/25/2019   Procedure: CARDIOVERSION;  Surgeon: Yates Decamp, MD;  Location: [facility] ENDOSCOPY;  Service: Cardiovascular;  Laterality: N/A;   CARDIOVERSION N/A 02/06/2020   Procedure: CARDIOVERSION;  Surgeon: Yates Decamp, MD;  Location: [facility] ENDOSCOPY;  Service: Cardiovascular;  Laterality: N/A;   CARDIOVERSION N/A 06/10/2022   Procedure: CARDIOVERSION;  Surgeon: Tessa Lerner, DO;  Location: [facility] ENDOSCOPY;  Service: Cardiovascular;  Laterality: N/A;   CARDIOVERSION N/A 02/01/2023   Procedure: CARDIOVERSION (CATH LAB);  Surgeon: Little Ishikawa, MD;  Location: [facility] INVASIVE CV LAB;  Service: Cardiovascular;  Laterality: N/A;   CARDIOVERSION N/A 03/23/2023   Procedure: CARDIOVERSION;  Surgeon: Chilton Si, MD;  Location: [facility] INVASIVE CV LAB;  Service: Cardiovascular;  Laterality: N/A;   CHOLECYSTECTOMY     ~ 6 yrs ago   COLONOSCOPY   2009   Dr.Chesley   CYSTECTOMY  09/04/2019   Dr.Gray   GALLBLADDER SURGERY  03/10/2015   Dr.Amid ( [place] )   POLYPECTOMY     12 polyps removed at [facility]    VENTRAL HERNIA REPAIR  2016     Social History:   reports that she has never smoked. She has never used smokeless tobacco. She reports current alcohol use of about 1.0 standard drink of alcohol per week. She reports that she does not use drugs.  Family History  Problem Relation Age of Onset   Breast cancer Mother    Alzheimer's disease Mother    Dementia Mother    Parkinson's disease Mother    Colon polyps Mother    Stroke Father    Dementia Father    Breast cancer Sister    Allergies Daughter    Allergies Daughter    Allergies Son    Colon cancer Neg Hx    Esophageal cancer Neg Hx    Stomach cancer Neg Hx    Rectal cancer Neg Hx      Medications:  Patient's Medications  New Prescriptions   No medications on file  Previous Medications   ACETAMINOPHEN (TYLENOL) 650 MG CR TABLET    Take 1,300 mg by mouth in the morning and at bedtime.   AMIODARONE (PACERONE) 200 MG TABLET    Take 1 tablet (200 mg total) by mouth daily.   APIXABAN (ELIQUIS) 5 MG TABS TABLET    TAKE 1 TABLET BY MOUTH TWICE DAILY   GABAPENTIN (NEURONTIN) 100 MG CAPSULE    1 cap po at bedtime x 3 nights, then increase to 2 caps if needed   GARLIC 1000 MG CAPS    Take 7,829 mg by mouth as needed. Periodically   LORATADINE (CLARITIN) 10 MG TABLET    Take 10 mg by mouth daily as needed for allergies or rhinitis.   METOPROLOL SUCCINATE (TOPROL-XL) 25 MG 24 HR TABLET    Take 1 tablet (25 mg total) by mouth in the morning. Take with or immediately following a meal.   MISC NATURAL PRODUCTS (ELDERBERRY IMMUNE COMPLEX PO)    Take 550 mg by mouth in the morning.   MULTIPLE MINERALS (CALCIUM/MAGNESIUM/ZINC PO)    Take 1 tablet by mouth daily. Vit d3   MULTIPLE VITAMINS-MINERALS (COMPLETE MULTIVITAMIN/MINERAL PO)    Take 1 tablet by mouth in the morning. One a  day   MULTIPLE VITAMINS-MINERALS (EMERGEN-C IMMUNE PO)    Take 5 mLs by mouth daily as needed. With 8 oz of water   MULTIPLE VITAMINS-MINERALS (EMERGEN-C VITAMIN C) CHEW    Chew 2 Chip by mouth daily.   NON FORMULARY    Apply 1 application  topically as needed (ankle pain). Body balm with magnesium 12mg    OMEGA-3 KRILL OIL 500 MG CAPS    Take 500 mg by mouth in the morning.   OVER THE COUNTER MEDICATION    Apply 1 Application topically daily as needed (Pain). veterinary liniment gel   PREDNISONE (STERAPRED UNI-PAK 21 TAB)  10 MG (21) TBPK TABLET    Use as directed   PSYLLIUM 60.3 % POWD    Take 5 mLs by mouth daily.   ROSUVASTATIN (CRESTOR) 10 MG TABLET    TAKE 1 TABLET(10 MG) BY MOUTH DAILY   SPIRONOLACTONE (ALDACTONE) 25 MG TABLET    TAKE 1 TABLET(25 MG) BY MOUTH EVERY MORNING   VALSARTAN-HYDROCHLOROTHIAZIDE (DIOVAN-HCT) 320-25 MG TABLET    TAKE 1 TABLET BY MOUTH EVERY MORNING   VITAMIN B-12 (CYANOCOBALAMIN) 1000 MCG TABLET    Take 1,000 mcg by mouth in the morning.  Modified Medications   No medications on file  Discontinued Medications   No medications on file     Physical Exam:  Vitals:   06/07/23 1328  BP: 138/80  Pulse: 61  Temp: 97.9 F (36.6 C)  TempSrc: Temporal  SpO2: 96%  Weight: 274 lb (124.3 kg)  Height: 5\' 7"  (1.702 m)   Body mass index is 42.91 kg/m.  Wt Readings from Last 3 Encounters:  06/07/23 274 lb (124.3 kg)  05/24/23 273 lb (123.8 kg)  04/09/23 275 lb 9.6 oz (125 kg)     Physical Exam Constitutional:      Appearance: Normal appearance. She is obese.  Cardiovascular:     Rate and Rhythm: Normal rate and regular rhythm.     Pulses: Normal pulses.     Heart sounds: Normal heart sounds.  Pulmonary:     Effort: Pulmonary effort is normal.     Breath sounds: Normal breath sounds.  Abdominal:     General: Bowel sounds are normal.     Palpations: Abdomen is soft.  Musculoskeletal:     Comments: Limited ROM left shoulder  Skin:    General: Skin is  warm and dry.  Neurological:     General: No focal deficit present.     Mental Status: She is alert and oriented to person, place, and time. Mental status is at baseline.  Psychiatric:        Mood and Affect: Mood normal.        Behavior: Behavior normal.      Labs reviewed: Basic Metabolic Panel:  Recent Labs    10/16/22 1606 12/15/22 1704 01/27/23 1043 03/19/23 1030  NA 139 137 140 139  K 4.5 3.9 4.1 4.3  CL 103 101 105 105  CO2 27 24 27 23   GLUCOSE 86 96 97 132*  BUN 20 16 17 13   CREATININE 1.18* 1.13* 1.12* 1.16*  CALCIUM 10.1 9.2 9.4 9.3  TSH 0.64  --   --  1.344   Liver Function Tests:  Recent Labs    10/16/22 1606 03/19/23 1030  AST 27 36  ALT 23 32  ALKPHOS  --  49  BILITOT 0.5 0.6  PROT 6.8 7.0  ALBUMIN  --  3.8   No results for input(s): "LIPASE", "AMYLASE" in the last 8760 hours. No results for input(s): "AMMONIA" in the last 8760 hours. CBC:  Recent Labs    10/16/22 1606 12/15/22 1704 01/27/23 1043 03/19/23 1030  WBC 5.9 6.0 4.5 5.4  NEUTROABS 3,481  --   --   --   HGB 12.3 12.5 12.7 12.3  HCT 39.0 39.3 40.6 40.1  MCV 86.9 88.1 87.7 87.9  PLT 234 195 207 238   Lipid Panel:  Recent Labs    10/16/22 1606  CHOL 148  HDL 47*  LDLCALC 76  TRIG 161*  CHOLHDL 3.1   TSH:  Recent Labs    10/16/22  1606 03/19/23 1030  TSH 0.64 1.344   A1C:  Lab Results  Component Value Date   HGBA1C 6.1 (H) 10/16/2022     Assessment/Plan   1. Essential hypertension (Primary) -Controlled, at goal, <140/90 -Continue current medication regimen -Encouraged dietary modifications/DASH diet -Increase physical activity as tolerated  2. Mixed hyperlipidemia -Continue Rosuvastatin -Encouraged dietary modifications and physical activity as tolerated  3. Hyperglycemia -Encouraged dietary modifications: decrease intake of processed foods, sugar -Increased physical activity as tolerated - Hemoglobin A1c  4. Morbid obesity (HCC) -BMI 42.91 -Encouraged  dietary modifications: decrease intake of processed foods, sugar -Increase physical activity as tolerated  5. Persistent atrial fibrillation (HCC) -Heart rate/rhythm regular today -Continue amiodarone, Toprol XL -Continue eliquis; no abnormal bleeding/bruising  6. Acute pain of left shoulder -ongoing, has improved but with limited ROM Continue to ice -pending ortho appt with PT scheduled as well  7. Right leg pain Has improved but still having discomfort  Ortho appt this week   Return in about 6 months (around 12/07/2023) for routine follow up, labs at time of visit.  Rollen Sox, Haroldine Laws MSN-FNP Student -I personally was present during the history, physical exam and medical decision-making activities of this service and have verified that the service and findings are accurately documented in the student's note Zyla Dascenzo K. Biagio Borg [facility] [phone]

## 2023-06-08 ENCOUNTER — Encounter: Payer: Self-pay | Admitting: Nurse Practitioner

## 2023-06-08 LAB — HEMOGLOBIN A1C
Hgb A1c MFr Bld: 6.3 %{Hb} — ABNORMAL HIGH (ref <5.7)
Mean Plasma Glucose: 134 mg/dL
eAG (mmol/L): 7.4 mmol/L

## 2023-06-09 NOTE — Telephone Encounter (Signed)
 Copied from CRM [id number]. Topic: General - Other >> Jun 09, 2023  1:09 PM Everette Rank wrote: Reason for CRM: Patient  RT leg pain/Request for order for xray seen on 03/31. Needs call back when updated [phone]

## 2023-06-10 ENCOUNTER — Ambulatory Visit: Admitting: Physical Therapy

## 2023-06-10 NOTE — Telephone Encounter (Signed)
 Spoke with patient this morning also called [facility] at [facility] to get clarification. Patient does understands that she will do the imaging at [facility] and not at [facility] because they only do physical therapy.  Patient had no other questions or concerns at this time.   Message sent to Sharon Seller, NP

## 2023-06-10 NOTE — Telephone Encounter (Signed)
 I spoke with patient and reviewed results with her.

## 2023-06-10 NOTE — Telephone Encounter (Signed)
 Please let patient know when she goes to see the orthopedic doctor they will do imaging in office if needed

## 2023-06-15 ENCOUNTER — Ambulatory Visit (INDEPENDENT_AMBULATORY_CARE_PROVIDER_SITE_OTHER): Admitting: Orthopaedic Surgery

## 2023-06-15 DIAGNOSIS — M25561 Pain in right knee: Secondary | ICD-10-CM | POA: Diagnosis not present

## 2023-06-15 DIAGNOSIS — M25512 Pain in left shoulder: Secondary | ICD-10-CM

## 2023-06-15 DIAGNOSIS — G8929 Other chronic pain: Secondary | ICD-10-CM | POA: Insufficient documentation

## 2023-06-15 DIAGNOSIS — M79661 Pain in right lower leg: Secondary | ICD-10-CM

## 2023-06-15 NOTE — Progress Notes (Signed)
 Office Visit Note   Patient: Terry Koch           Date of Birth: 06/23/1945           MRN: [id number] Visit Date: 06/15/2023              Requested by: Sharon Seller, NP [address] [place]. [place],  [place] [address] PCP: Sharon Seller, NP   Assessment & Plan: Visit Diagnoses:  1. Chronic left shoulder pain   2. Chronic pain of right knee   3. Pain in right lower leg     Plan: For the shoulder suspicion is traumatic rotator cuff tear.  Based on findings and her age recommendation is to send her physical therapy for shoulder rehab and strengthening.  For the knee we will place a order for an ultrasound to rule out DVT.  Follow-Up Instructions: No follow-ups on file.   Orders:  Orders Placed This Encounter  Procedures   Ambulatory referral to Physical Therapy   VAS [place] LOWER EXTREMITY VENOUS (DVT)   No orders of the defined types were placed in this encounter.     Procedures: No procedures performed   Clinical Data: No additional findings.   Subjective: Chief Complaint  Patient presents with   Right Leg - Pain   Left Shoulder - Pain    HPI Terry Koch is a 78 year old female here for evaluation of left shoulder pain and weakness and right leg strain dizziness feeling.  For the shoulder she had a fall onto 1/25.  Feels pain at night and weakness.  For the leg she has a strong family history of DVT.  Wants to get it checked out. Review of Systems  Constitutional: Negative.   HENT: Negative.    Eyes: Negative.   Respiratory: Negative.    Cardiovascular: Negative.   Endocrine: Negative.   Musculoskeletal: Negative.   Neurological: Negative.   Hematological: Negative.   Psychiatric/Behavioral: Negative.    All other systems reviewed and are negative.    Objective: Vital Signs: There were no vitals taken for this visit.  Physical Exam Vitals and nursing note reviewed.  Constitutional:      Appearance: She is well-developed.  HENT:      Head: Atraumatic.     Nose: Nose normal.  Eyes:     Extraocular Movements: Extraocular movements intact.  Cardiovascular:     Pulses: Normal pulses.  Pulmonary:     Effort: Pulmonary effort is normal.  Abdominal:     Palpations: Abdomen is soft.  Musculoskeletal:     Cervical back: Neck supple.  Skin:    General: Skin is warm.     Capillary Refill: Capillary refill takes less than 2 seconds.  Neurological:     Mental Status: She is alert. Mental status is at baseline.  Psychiatric:        Behavior: Behavior normal.        Thought Content: Thought content normal.        Judgment: Judgment normal.     Ortho Exam Exam of the left shoulder shows significant weakness to manual muscle testing of the supraspinatus infraspinatus and subscapularis.  Passive range of motion is normal with pain.  Shrugs with active abduction. Exam of the right leg and knee are unremarkable. Specialty Comments:  No specialty comments available.  Imaging: No results found.   PMFS History: Patient Active Problem List   Diagnosis Date Noted   Chronic left shoulder pain 06/15/2023   Chronic pain of  right knee 06/15/2023   Encounter for monitoring amiodarone therapy 03/19/2023   Hypercoagulable state due to persistent atrial fibrillation (HCC) 01/05/2023   Arthritis    Hyperlipidemia    Hypertension    Persistent atrial fibrillation [facility])    Past Medical History:  Diagnosis Date   Arthritis    self report   Bone fracture    Right Foot   H/O hernia repair    History of hysterectomy    History of mammogram    Dr.Elizabeth Pascal Lux   History of MRI    Dr.Elizabeth Pascal Lux.   History of removal of skin mole 03/09/1997   Hyperlipidemia    Hypertension    Persistent atrial fibrillation (HCC)    Scalp hematoma    Wears glasses     Family History  Problem Relation Age of Onset   Breast cancer Mother    Alzheimer's disease Mother    Dementia Mother    Parkinson's disease Mother    Colon polyps  Mother    Stroke Father    Dementia Father    Breast cancer Sister    Allergies Daughter    Allergies Daughter    Allergies Son    Colon cancer Neg Hx    Esophageal cancer Neg Hx    Stomach cancer Neg Hx    Rectal cancer Neg Hx     Past Surgical History:  Procedure Laterality Date   ABDOMINAL HYSTERECTOMY     BREAST BIOPSY  03/09/1997   Dr.Finder   BREAST BIOPSY Right 11/07/2019   FIBROADENOMA WITH CALCIFICATIONS   BREAST BIOPSY Left 11/07/2019   FIBROADENOMA WITH CALCIFICATIONS   CARDIOVERSION N/A 12/25/2019   Procedure: CARDIOVERSION;  Surgeon: Yates Decamp, MD;  Location: [facility] ENDOSCOPY;  Service: Cardiovascular;  Laterality: N/A;   CARDIOVERSION N/A 02/06/2020   Procedure: CARDIOVERSION;  Surgeon: Yates Decamp, MD;  Location: [facility] ENDOSCOPY;  Service: Cardiovascular;  Laterality: N/A;   CARDIOVERSION N/A 06/10/2022   Procedure: CARDIOVERSION;  Surgeon: Tessa Lerner, DO;  Location: [facility] ENDOSCOPY;  Service: Cardiovascular;  Laterality: N/A;   CARDIOVERSION N/A 02/01/2023   Procedure: CARDIOVERSION (CATH LAB);  Surgeon: Little Ishikawa, MD;  Location: [facility] INVASIVE CV LAB;  Service: Cardiovascular;  Laterality: N/A;   CARDIOVERSION N/A 03/23/2023   Procedure: CARDIOVERSION;  Surgeon: Chilton Si, MD;  Location: [facility] INVASIVE CV LAB;  Service: Cardiovascular;  Laterality: N/A;   CHOLECYSTECTOMY     ~ 6 yrs ago   COLONOSCOPY  2009   Dr.Chesley   CYSTECTOMY  09/04/2019   Dr.Gray   GALLBLADDER SURGERY  03/10/2015   Dr.Amid ( [place] )   POLYPECTOMY     12 polyps removed at [facility]    VENTRAL HERNIA REPAIR  2016   Social History   Occupational History   Not on file  Tobacco Use   Smoking status: Never   Smokeless tobacco: Never   Tobacco comments:    Never smoked 01/05/23  Vaping Use   Vaping status: Never Used  Substance and Sexual Activity   Alcohol use: Yes    Alcohol/week: 1.0 standard drink of alcohol    Types: 1 Glasses of wine per week    Comment:  occasionally   Drug use: Never   Sexual activity: Not Currently

## 2023-06-16 ENCOUNTER — Telehealth (HOSPITAL_COMMUNITY): Payer: Self-pay

## 2023-06-16 NOTE — Telephone Encounter (Signed)
 Received information about order, Attempted to contact the patient to schedule VAS [place].  No answer.  Left message.  First Attempt. Provided  direct contact number for scheduling: [phone].  Sent information to the requesting office regarding selecting a location other than internal so that the order is routed to a work queue that is seen by schedulers.

## 2023-06-23 ENCOUNTER — Ambulatory Visit

## 2023-06-23 ENCOUNTER — Encounter: Payer: Self-pay | Admitting: Radiology

## 2023-06-29 ENCOUNTER — Encounter: Payer: Self-pay | Admitting: Physical Therapy

## 2023-06-29 ENCOUNTER — Other Ambulatory Visit: Payer: Self-pay

## 2023-06-29 ENCOUNTER — Ambulatory Visit: Attending: Orthopaedic Surgery | Admitting: Physical Therapy

## 2023-06-29 DIAGNOSIS — M25512 Pain in left shoulder: Secondary | ICD-10-CM | POA: Diagnosis present

## 2023-06-29 DIAGNOSIS — M6281 Muscle weakness (generalized): Secondary | ICD-10-CM | POA: Diagnosis present

## 2023-06-29 DIAGNOSIS — M79604 Pain in right leg: Secondary | ICD-10-CM | POA: Insufficient documentation

## 2023-06-29 NOTE — Therapy (Signed)
 OUTPATIENT PHYSICAL THERAPY SHOULDER EVALUATION   Patient Name: Terry Koch MRN: [id number] DOB:1945/05/09, 78 y.o., female Today's Date: 06/29/2023   PT End of Session - 06/29/23 1421     Visit Number 1    Number of Visits --   1-2x/week   Date for PT Re-Evaluation 08/24/23    Authorization Type MCR - Quick [employer]    Progress Note Due on Visit 10    PT Start Time 1330    PT Stop Time 1412    PT Time Calculation (min) 42 min             Past Medical History:  Diagnosis Date   Arthritis    self report   Bone fracture    Right Foot   H/O hernia repair    History of hysterectomy    History of mammogram    Dr.Elizabeth Reola Casino   History of MRI    Dr.Elizabeth Reola Casino.   History of removal of skin mole 03/09/1997   Hyperlipidemia    Hypertension    Persistent atrial fibrillation (HCC)    Scalp hematoma    Wears glasses    Past Surgical History:  Procedure Laterality Date   ABDOMINAL HYSTERECTOMY     BREAST BIOPSY  03/09/1997   Dr.Finder   BREAST BIOPSY Right 11/07/2019   FIBROADENOMA WITH CALCIFICATIONS   BREAST BIOPSY Left 11/07/2019   FIBROADENOMA WITH CALCIFICATIONS   CARDIOVERSION N/A 12/25/2019   Procedure: CARDIOVERSION;  Surgeon: Knox Perl, MD;  Location: [facility] ENDOSCOPY;  Service: Cardiovascular;  Laterality: N/A;   CARDIOVERSION N/A 02/06/2020   Procedure: CARDIOVERSION;  Surgeon: Knox Perl, MD;  Location: [facility] ENDOSCOPY;  Service: Cardiovascular;  Laterality: N/A;   CARDIOVERSION N/A 06/10/2022   Procedure: CARDIOVERSION;  Surgeon: Olinda Bertrand, DO;  Location: [facility] ENDOSCOPY;  Service: Cardiovascular;  Laterality: N/A;   CARDIOVERSION N/A 02/01/2023   Procedure: CARDIOVERSION (CATH LAB);  Surgeon: Wendie Hamburg, MD;  Location: [facility] INVASIVE CV LAB;  Service: Cardiovascular;  Laterality: N/A;   CARDIOVERSION N/A 03/23/2023   Procedure: CARDIOVERSION;  Surgeon: Maudine Sos, MD;  Location: [facility] INVASIVE CV LAB;  Service: Cardiovascular;   Laterality: N/A;   CHOLECYSTECTOMY     ~ 6 yrs ago   COLONOSCOPY  2009   Dr.Chesley   CYSTECTOMY  09/04/2019   Dr.Gray   GALLBLADDER SURGERY  03/10/2015   Dr.Amid ( [place]  )   POLYPECTOMY     12 polyps removed at [facility]    VENTRAL HERNIA REPAIR  2016   Patient Active Problem List   Diagnosis Date Noted   Chronic left shoulder pain 06/15/2023   Chronic pain of right knee 06/15/2023   Encounter for monitoring amiodarone  therapy 03/19/2023   Hypercoagulable state due to persistent atrial fibrillation (HCC) 01/05/2023   Arthritis    Hyperlipidemia    Hypertension    Persistent atrial fibrillation (HCC)     PCP: Verma Gobble, NP  REFERRING PROVIDER: Wes Hamman, MD  THERAPY DIAG:  Left shoulder pain, unspecified chronicity - Plan: PT plan of care cert/re-cert  Muscle weakness - Plan: PT plan of care cert/re-cert  REFERRING DIAG: Chronic L shoulder pain  Rationale for Evaluation and Treatment:  Rehabilitation  SUBJECTIVE:  PERTINENT PAST HISTORY:  Afib, chronic R knee pain        PRECAUTIONS: None  WEIGHT BEARING RESTRICTIONS No  FALLS:  Has patient fallen in last 6 months? Yes, Number of falls: 1 fall resulting in L shoulder injury - "rushing"  MOI/History of condition:  Onset date: Early February 2025  SUBJECTIVE STATEMENT  Terry Koch is a 78 y.o. female who presents to clinic with chief complaint of L shoulder pain which started she was rushing and fell down.  She caught her foot on a chair.  She grabbed a table on the way down and had significant pain since that time.  She is unable to lift her arm and has significant pain at night.  No fracture on x-ray.  Pain:  Are you having pain? Yes Pain location: L shoulder pain  NPRS scale:  2/10 to 8/10 Aggravating factors: reaching OH, shoulder movements Relieving factors: rest Pain description: intermittent Stage: Chronic 24 hour pattern: worse with activity and at night    Occupation: retired   Education administrator: NA  Hand Dominance: R   Patient Goals/Specific Activities: improve ROM and pain of L shoulder   OBJECTIVE:   DIAGNOSTIC FINDINGS:  X-ray:  IMPRESSION: No acute findings.   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     SENSATION: Light touch: Appears intact   UPPER EXTREMITY AROM:  ROM Right (Eval) Left (Eval)  Shoulder flexion 125* 85*  Shoulder abduction 115* 85*  Shoulder internal rotation    Shoulder external rotation    Functional IR SI L3  Functional ER C7 Ear *  Shoulder extension    Elbow extension    Elbow flexion     (Blank rows = not tested, N = WNL, * = concordant pain with testing)  UPPER EXTREMITY MMT:  MMT Right (Eval) Left (Eval)  Shoulder flexion  2+* in available range  Shoulder abduction (C5)  2+* in available range  Shoulder ER  3+*  Shoulder IR  4*  Middle trapezius    Lower trapezius    Shoulder extension    Grip strength    Cervical flexion (C1,C2)    Cervical S/B (C3)    Shoulder shrug (C4)    Elbow flexion (C6)    Elbow ext (C7)    Thumb ext (C8)    Finger abd (T1)    Grossly     (Blank rows = not tested, score listed is out of 5 possible points.  N = WNL, D = diminished, C = clear for gross weakness with myotome testing, * = concordant pain with testing)   UPPER EXTREMITY PROM:  PROM Right (Eval) Left (Eval)  Shoulder flexion  120+*  Shoulder abduction    Shoulder internal rotation    Shoulder external rotation    Functional IR    Functional ER    Shoulder extension    Elbow extension    Elbow flexion     (Blank rows = not tested, N = WNL, * = concordant pain with testing)  SPECIAL TESTS: R/C test cluster: (+)    PATIENT SURVEYS:  QuickDASH Score: 43.2 / 100 = 43.2 %    TODAY'S TREATMENT:  Therapeutic Exercise: Creating, reviewing, and completing below HEP   PATIENT EDUCATION ([place]/HM):  POC, diagnosis, prognosis, HEP, and outcome  measures.  Relevant anatomy and role of R/C in shoulder movements.  Pt educated via explanation, demonstration, and handout (HEP).  Pt confirms understanding verbally.   HOME EXERCISE PROGRAM: Access Code: [email or web address] URL: [email or web address] Date: 06/29/2023 Prepared by: Lesleigh Rash  Exercises - Standing Row with Anchored Resistance  - 1 x daily - 7 x weekly - 2 sets - 10-15 reps - Supine Shoulder Flexion Extension AAROM with Dowel  - 1 x daily - 7 x  weekly - 2 sets - 10 reps  Treatment priorities   Eval                                                  ASSESSMENT:  CLINICAL IMPRESSION: Santina is a 79 y.o. female who presents to clinic with signs and sxs consistent with L shoulder pain.  Consistent with physician impression of L R/C after fall in February.  Jaymie will benefit from skilled therapy to address relevant deficits and improve comfort and function in daily tasks involving reaching and lifting.     OBJECTIVE IMPAIRMENTS: Pain, L shoulder strength, L shoulder ROM  ACTIVITY LIMITATIONS: reaching, lifting, housework, driving  PERSONAL FACTORS: See medical history and pertinent history   REHAB POTENTIAL: Fair possible full thickness tear  CLINICAL DECISION MAKING: Evolving/moderate complexity  EVALUATION COMPLEXITY: Moderate   GOALS:   SHORT TERM GOALS: Target date: 07/27/2023   Austyn will be >75% HEP compliant to improve carryover between sessions and facilitate independent management of condition  Evaluation: ongoing Goal status: INITIAL   LONG TERM GOALS: Target date: 08/24/2023   Charyl will self report >/= 50% decrease in pain from evaluation to improve function in daily tasks  Evaluation/Baseline: 8/10 max pain Goal status: INITIAL   2.  Therma will be able to braid her own hair, not limited by pain  Evaluation/Baseline: limited by pain and ROM Goal status: INITIAL   3.  Mechel will be able to  reach repeatedly to first cabinet shelf with weight equivalent to plate, not limited by pain  Evaluation/Baseline: limited Goal status: INITIAL   4.  Samra will demonstrate >110 degrees of active ROM in flexion to allow completion of activities involving reaching OH, not limited by pain  Evaluation/Baseline: 85 degrees Goal status: INITIAL   5. Isbella will show a >/= 20 pct improvement in her QUICK DASH score (MCID is 10% or ~5 pts) as a proxy for functional improvement   Evaluation/Baseline:  QuickDASH Score: 43.2 / 100 = 43.2 % Goal status: INITIAL    PLAN: PT FREQUENCY: 1-2x/week  PT DURATION: 8 weeks  PLANNED INTERVENTIONS:  97164- PT Re-evaluation, 97110-Therapeutic exercises, 97530- Therapeutic activity, [id number]- Neuromuscular re-education, 97535- Self Care, 96045- Manual therapy, [id number]- Gait training, [id number]- Aquatic Therapy, [id number]- Electrical stimulation (manual), [id number]- Vasopneumatic device, [id number]- Traction (mechanical), [id number]- Ionotophoresis 4mg /ml Dexamethasone, Taping, Dry Needling, Joint manipulation, and Spinal manipulation.   Christella App PT, DPT 06/29/2023, 2:24 PM

## 2023-07-08 ENCOUNTER — Encounter: Payer: Self-pay | Admitting: Physical Therapy

## 2023-07-08 ENCOUNTER — Ambulatory Visit: Attending: Orthopaedic Surgery | Admitting: Physical Therapy

## 2023-07-08 DIAGNOSIS — M25512 Pain in left shoulder: Secondary | ICD-10-CM | POA: Diagnosis present

## 2023-07-08 DIAGNOSIS — M6281 Muscle weakness (generalized): Secondary | ICD-10-CM | POA: Diagnosis present

## 2023-07-08 NOTE — Therapy (Signed)
 OUTPATIENT PHYSICAL THERAPY DAILY NOTE   Patient Name: Terry Koch MRN: [id number] DOB:06/23/1945, 78 y.o., female Today's Date: 07/08/2023   PT End of Session - 07/08/23 1137     Visit Number 2    Number of Visits --   1-2x/week   Date for PT Re-Evaluation 08/24/23    Authorization Type MCR - Quick Dash    Progress Note Due on Visit 10    PT Start Time 1145    PT Stop Time 1226    PT Time Calculation (min) 41 min             Past Medical History:  Diagnosis Date   Arthritis    self report   Bone fracture    Right Foot   H/O hernia repair    History of hysterectomy    History of mammogram    Dr.Elizabeth Reola Casino   History of MRI    Dr.Elizabeth Reola Casino.   History of removal of skin mole 03/09/1997   Hyperlipidemia    Hypertension    Persistent atrial fibrillation (HCC)    Scalp hematoma    Wears glasses    Past Surgical History:  Procedure Laterality Date   ABDOMINAL HYSTERECTOMY     BREAST BIOPSY  03/09/1997   Dr.Finder   BREAST BIOPSY Right 11/07/2019   FIBROADENOMA WITH CALCIFICATIONS   BREAST BIOPSY Left 11/07/2019   FIBROADENOMA WITH CALCIFICATIONS   CARDIOVERSION N/A 12/25/2019   Procedure: CARDIOVERSION;  Surgeon: Knox Perl, MD;  Location: [facility] ENDOSCOPY;  Service: Cardiovascular;  Laterality: N/A;   CARDIOVERSION N/A 02/06/2020   Procedure: CARDIOVERSION;  Surgeon: Knox Perl, MD;  Location: [facility] ENDOSCOPY;  Service: Cardiovascular;  Laterality: N/A;   CARDIOVERSION N/A 06/10/2022   Procedure: CARDIOVERSION;  Surgeon: Olinda Bertrand, DO;  Location: [facility] ENDOSCOPY;  Service: Cardiovascular;  Laterality: N/A;   CARDIOVERSION N/A 02/01/2023   Procedure: CARDIOVERSION (CATH LAB);  Surgeon: Wendie Hamburg, MD;  Location: [facility] INVASIVE CV LAB;  Service: Cardiovascular;  Laterality: N/A;   CARDIOVERSION N/A 03/23/2023   Procedure: CARDIOVERSION;  Surgeon: Maudine Sos, MD;  Location: [facility] INVASIVE CV LAB;  Service: Cardiovascular;  Laterality: N/A;    CHOLECYSTECTOMY     ~ 6 yrs ago   COLONOSCOPY  2009   Dr.Chesley   CYSTECTOMY  09/04/2019   Dr.Gray   GALLBLADDER SURGERY  03/10/2015   Dr.Amid ( [place]  )   POLYPECTOMY     12 polyps removed at [facility]    VENTRAL HERNIA REPAIR  2016   Patient Active Problem List   Diagnosis Date Noted   Chronic left shoulder pain 06/15/2023   Chronic pain of right knee 06/15/2023   Encounter for monitoring amiodarone  therapy 03/19/2023   Hypercoagulable state due to persistent atrial fibrillation (HCC) 01/05/2023   Arthritis    Hyperlipidemia    Hypertension    Persistent atrial fibrillation (HCC)     PCP: Verma Gobble, NP  REFERRING PROVIDER: Wes Hamman, MD  THERAPY DIAG:  Left shoulder pain, unspecified chronicity  Muscle weakness  REFERRING DIAG: Chronic L shoulder pain  Rationale for Evaluation and Treatment:  Rehabilitation  SUBJECTIVE:  PERTINENT PAST HISTORY:  Afib, chronic R knee pain        PRECAUTIONS: None  WEIGHT BEARING RESTRICTIONS No  FALLS:  Has patient fallen in last 6 months? Yes, Number of falls: 1 fall resulting in L shoulder injury - "rushing"  MOI/History of condition:  Onset date: Early February 2025  SUBJECTIVE STATEMENT  07/08/2023:  Pt reports that her shoulder pain is variable.  When she is active her shoulder hurts more which is frustrating for her.  EVAL: Terry Koch is a 78 y.o. female who presents to clinic with chief complaint of L shoulder pain which started she was rushing and fell down.  She caught her foot on a chair.  She grabbed a table on the way down and had significant pain since that time.  She is unable to lift her arm and has significant pain at night.  No fracture on x-ray.  Pain:  Are you having pain? Yes Pain location: L shoulder pain  NPRS scale:  2/10 to 8/10 Aggravating factors: reaching OH, shoulder movements Relieving factors: rest Pain description: intermittent Stage: Chronic 24 hour pattern:  worse with activity and at night   Occupation: retired   Education administrator: NA  Hand Dominance: R   Patient Goals/Specific Activities: improve ROM and pain of L shoulder   OBJECTIVE:   DIAGNOSTIC FINDINGS:  X-ray:  IMPRESSION: No acute findings.   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     SENSATION: Light touch: Appears intact   UPPER EXTREMITY AROM:  ROM Right (Eval) Left (Eval)  Shoulder flexion 125* 85*  Shoulder abduction 115* 85*  Shoulder internal rotation    Shoulder external rotation    Functional IR SI L3  Functional ER C7 Ear *  Shoulder extension    Elbow extension    Elbow flexion     (Blank rows = not tested, N = WNL, * = concordant pain with testing)  UPPER EXTREMITY MMT:  MMT Right (Eval) Left (Eval)  Shoulder flexion  2+* in available range  Shoulder abduction (C5)  2+* in available range  Shoulder ER  3+*  Shoulder IR  4*  Middle trapezius    Lower trapezius    Shoulder extension    Grip strength    Cervical flexion (C1,C2)    Cervical S/B (C3)    Shoulder shrug (C4)    Elbow flexion (C6)    Elbow ext (C7)    Thumb ext (C8)    Finger abd (T1)    Grossly     (Blank rows = not tested, score listed is out of 5 possible points.  N = WNL, D = diminished, C = clear for gross weakness with myotome testing, * = concordant pain with testing)   UPPER EXTREMITY PROM:  PROM Right (Eval) Left (Eval)  Shoulder flexion  120+*  Shoulder abduction    Shoulder internal rotation    Shoulder external rotation    Functional IR    Functional ER    Shoulder extension    Elbow extension    Elbow flexion     (Blank rows = not tested, N = WNL, * = concordant pain with testing)  SPECIAL TESTS: R/C test cluster: (+)    PATIENT SURVEYS:  QuickDASH Score: 43.2 / 100 = 43.2 %    TODAY'S TREATMENT:   [facility] Adult PT Treatment  07/08/2023:  Therapeutic Exercise: Chest press with dowel - 2x10 Horizontal abd AA in  supine - stopped d/t pain Supine shoulder flexion non painful arc - 2x10 Horizontal abd - YTB - 2x10 - small arc Standing row - GTB - 2x10 Standing shoulder ext - YTB - 2x10 Wall slide - 2x15 Isometric step out - 2x10 IR/ER GTB/YTB  Manual Therapy AP and inferior joint mobs - G II - G III    HOME EXERCISE PROGRAM: Access Code: [email or web address]  URL: [email or web address] Date: 07/08/2023 Prepared by: Lesleigh Rash  Exercises - Standing Row with Anchored Resistance  - 1 x daily - 7 x weekly - 2 sets - 10-15 reps - Shoulder extension with resistance - Neutral  - 1 x daily - 7 x weekly - 3 sets - 10 reps - Supine Shoulder Flexion Extension AAROM with Dowel  - 1 x daily - 4-7 x weekly - 2 sets - 10 reps - Shoulder External Rotation Reactive Isometrics  - 1 x daily - 4-7 x weekly - 2 sets - 10 reps - Shoulder Internal Rotation Reactive Isometrics  - 1 x daily - 4-7 x weekly - 2 sets - 10 reps  Treatment priorities   Eval                                                  ASSESSMENT:  CLINICAL IMPRESSION:  07/08/2023:  Terry Koch tolerated session well with no adverse reaction.  We concentrated on relatively pain free AAROM exercises with some periscapular strengthening.  Discussed importance of not completing excessive exercise which is painful at home and to work on the basics provided in HEP.  Will continue to progress as able.  EVAL:  Terry Koch is a 78 y.o. female who presents to clinic with signs and sxs consistent with L shoulder pain.  Consistent with physician impression of L R/C after fall in February.  Terry Koch will benefit from skilled therapy to address relevant deficits and improve comfort and function in daily tasks involving reaching and lifting.     OBJECTIVE IMPAIRMENTS: Pain, L shoulder strength, L shoulder ROM  ACTIVITY LIMITATIONS: reaching, lifting, housework, driving  PERSONAL FACTORS: See medical history and pertinent history   REHAB  POTENTIAL: Fair possible full thickness tear  CLINICAL DECISION MAKING: Evolving/moderate complexity  EVALUATION COMPLEXITY: Moderate   GOALS:   SHORT TERM GOALS: Target date: 07/27/2023   Terry Koch will be >75% HEP compliant to improve carryover between sessions and facilitate independent management of condition  Evaluation: ongoing Goal status: INITIAL   LONG TERM GOALS: Target date: 08/24/2023   Terry Koch will self report >/= 50% decrease in pain from evaluation to improve function in daily tasks  Evaluation/Baseline: 8/10 max pain Goal status: INITIAL   2.  Terry Koch will be able to braid her own hair, not limited by pain  Evaluation/Baseline: limited by pain and ROM Goal status: INITIAL   3.  Terry Koch will be able to reach repeatedly to first cabinet shelf with weight equivalent to plate, not limited by pain  Evaluation/Baseline: limited Goal status: INITIAL   4.  Terry Koch will demonstrate >110 degrees of active ROM in flexion to allow completion of activities involving reaching OH, not limited by pain  Evaluation/Baseline: 85 degrees Goal status: INITIAL   5. Terry Koch will show a >/= 20 pct improvement in her QUICK DASH score (MCID is 10% or ~5 pts) as a proxy for functional improvement   Evaluation/Baseline:  QuickDASH Score: 43.2 / 100 = 43.2 % Goal status: INITIAL    PLAN: PT FREQUENCY: 1-2x/week  PT DURATION: 8 weeks  PLANNED INTERVENTIONS:  97164- PT Re-evaluation, 97110-Therapeutic exercises, 97530- Therapeutic activity, [id number]- Neuromuscular re-education, 97535- Self Care, [id number]- Manual therapy, [id number]- Gait training, [id number]- Aquatic Therapy, [id number]- Electrical stimulation (manual), [id number]- Vasopneumatic device, [id number]- Traction (mechanical), [id number]- Ionotophoresis 4mg /ml Dexamethasone, Taping, Dry Needling, Joint manipulation, and Spinal  manipulation.   Terry Koch Lady PT, DPT 07/08/2023, 1:27 PM

## 2023-07-13 ENCOUNTER — Encounter: Payer: Self-pay | Admitting: Physical Therapy

## 2023-07-13 ENCOUNTER — Ambulatory Visit: Admitting: Physical Therapy

## 2023-07-13 DIAGNOSIS — M25512 Pain in left shoulder: Secondary | ICD-10-CM | POA: Diagnosis not present

## 2023-07-13 DIAGNOSIS — M6281 Muscle weakness (generalized): Secondary | ICD-10-CM

## 2023-07-13 NOTE — Therapy (Signed)
 OUTPATIENT PHYSICAL THERAPY DAILY NOTE   Patient Name: Terry Koch MRN: [id number] DOB:08-Jun-1945, 78 y.o., female Today's Date: 07/13/2023   PT End of Session - 07/13/23 1501     Visit Number 3    Number of Visits --   1-2x/week   Date for PT Re-Evaluation 08/24/23    Authorization Type MCR - Quick Dash    Progress Note Due on Visit 10    PT Start Time 1500    PT Stop Time 1541    PT Time Calculation (min) 41 min             Past Medical History:  Diagnosis Date   Arthritis    self report   Bone fracture    Right Foot   H/O hernia repair    History of hysterectomy    History of mammogram    Dr.Elizabeth Reola Casino   History of MRI    Dr.Elizabeth Reola Casino.   History of removal of skin mole 03/09/1997   Hyperlipidemia    Hypertension    Persistent atrial fibrillation (HCC)    Scalp hematoma    Wears glasses    Past Surgical History:  Procedure Laterality Date   ABDOMINAL HYSTERECTOMY     BREAST BIOPSY  03/09/1997   Dr.Finder   BREAST BIOPSY Right 11/07/2019   FIBROADENOMA WITH CALCIFICATIONS   BREAST BIOPSY Left 11/07/2019   FIBROADENOMA WITH CALCIFICATIONS   CARDIOVERSION N/A 12/25/2019   Procedure: CARDIOVERSION;  Surgeon: Knox Perl, MD;  Location: [facility] ENDOSCOPY;  Service: Cardiovascular;  Laterality: N/A;   CARDIOVERSION N/A 02/06/2020   Procedure: CARDIOVERSION;  Surgeon: Knox Perl, MD;  Location: [facility] ENDOSCOPY;  Service: Cardiovascular;  Laterality: N/A;   CARDIOVERSION N/A 06/10/2022   Procedure: CARDIOVERSION;  Surgeon: Olinda Bertrand, DO;  Location: [facility] ENDOSCOPY;  Service: Cardiovascular;  Laterality: N/A;   CARDIOVERSION N/A 02/01/2023   Procedure: CARDIOVERSION (CATH LAB);  Surgeon: Wendie Hamburg, MD;  Location: [facility] INVASIVE CV LAB;  Service: Cardiovascular;  Laterality: N/A;   CARDIOVERSION N/A 03/23/2023   Procedure: CARDIOVERSION;  Surgeon: Maudine Sos, MD;  Location: [facility] INVASIVE CV LAB;  Service: Cardiovascular;  Laterality: N/A;    CHOLECYSTECTOMY     ~ 6 yrs ago   COLONOSCOPY  2009   Dr.Chesley   CYSTECTOMY  09/04/2019   Dr.Gray   GALLBLADDER SURGERY  03/10/2015   Dr.Amid ( [place]  )   POLYPECTOMY     12 polyps removed at [facility]    VENTRAL HERNIA REPAIR  2016   Patient Active Problem List   Diagnosis Date Noted   Chronic left shoulder pain 06/15/2023   Chronic pain of right knee 06/15/2023   Encounter for monitoring amiodarone  therapy 03/19/2023   Hypercoagulable state due to persistent atrial fibrillation (HCC) 01/05/2023   Arthritis    Hyperlipidemia    Hypertension    Persistent atrial fibrillation (HCC)     PCP: Verma Gobble, NP  REFERRING PROVIDER: Wes Hamman, MD  THERAPY DIAG:  Left shoulder pain, unspecified chronicity  Muscle weakness  REFERRING DIAG: Chronic L shoulder pain  Rationale for Evaluation and Treatment:  Rehabilitation  SUBJECTIVE:  PERTINENT PAST HISTORY:  Afib, chronic R knee pain        PRECAUTIONS: None  WEIGHT BEARING RESTRICTIONS No  FALLS:  Has patient fallen in last 6 months? Yes, Number of falls: 1 fall resulting in L shoulder injury - "rushing"  MOI/History of condition:  Onset date: Early February 2025  SUBJECTIVE STATEMENT  07/13/2023:  Pt reports that she is doing well overall and sees some progress.  She has been completing her HEP.  EVAL: Terry Koch is a 78 y.o. female who presents to clinic with chief complaint of L shoulder pain which started she was rushing and fell down.  She caught her foot on a chair.  She grabbed a table on the way down and had significant pain since that time.  She is unable to lift her arm and has significant pain at night.  No fracture on x-ray.  Pain:  Are you having pain? Yes Pain location: L shoulder pain  NPRS scale:  2/10 to 8/10 Aggravating factors: reaching OH, shoulder movements Relieving factors: rest Pain description: intermittent Stage: Chronic 24 hour pattern: worse with activity  and at night   Occupation: retired   Education administrator: NA  Hand Dominance: R   Patient Goals/Specific Activities: improve ROM and pain of L shoulder   OBJECTIVE:   DIAGNOSTIC FINDINGS:  X-ray:  IMPRESSION: No acute findings.   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     SENSATION: Light touch: Appears intact   UPPER EXTREMITY AROM:  ROM Right (Eval) Left (Eval)  Shoulder flexion 125* 85*  Shoulder abduction 115* 85*  Shoulder internal rotation    Shoulder external rotation    Functional IR SI L3  Functional ER C7 Ear *  Shoulder extension    Elbow extension    Elbow flexion     (Blank rows = not tested, N = WNL, * = concordant pain with testing)  UPPER EXTREMITY MMT:  MMT Right (Eval) Left (Eval)  Shoulder flexion  2+* in available range  Shoulder abduction (C5)  2+* in available range  Shoulder ER  3+*  Shoulder IR  4*  Middle trapezius    Lower trapezius    Shoulder extension    Grip strength    Cervical flexion (C1,C2)    Cervical S/B (C3)    Shoulder shrug (C4)    Elbow flexion (C6)    Elbow ext (C7)    Thumb ext (C8)    Finger abd (T1)    Grossly     (Blank rows = not tested, score listed is out of 5 possible points.  N = WNL, D = diminished, C = clear for gross weakness with myotome testing, * = concordant pain with testing)   UPPER EXTREMITY PROM:  PROM Right (Eval) Left (Eval)  Shoulder flexion  120+*  Shoulder abduction    Shoulder internal rotation    Shoulder external rotation    Functional IR    Functional ER    Shoulder extension    Elbow extension    Elbow flexion     (Blank rows = not tested, N = WNL, * = concordant pain with testing)  SPECIAL TESTS: R/C test cluster: (+)    PATIENT SURVEYS:  QuickDASH Score: 43.2 / 100 = 43.2 %    TODAY'S TREATMENT:   [facility] Adult PT Treatment  07/13/2023:  Therapeutic Exercise: UBE - 2.5/2.5 L1 Horizontal abd - GTB - 2x10 - small arc Supine  unilateral chest press - 2x10 - 3# Supine SA circles - 20x ea Standing row - GTB - 2x10 Standing shoulder ext - Black TB - 2x15 Wall slide - 2x15 ER/IR YTB/GTB - 2x15  HOME EXERCISE PROGRAM: Access Code: [email or web address] URL: [email or web address] Date: 07/08/2023 Prepared by: Lesleigh Rash  Exercises - Standing Row with Anchored Resistance  - 1 x daily - 7 x weekly -  2 sets - 10-15 reps - Shoulder extension with resistance - Neutral  - 1 x daily - 7 x weekly - 3 sets - 10 reps - Supine Shoulder Flexion Extension AAROM with Dowel  - 1 x daily - 4-7 x weekly - 2 sets - 10 reps - Shoulder External Rotation Reactive Isometrics  - 1 x daily - 4-7 x weekly - 2 sets - 10 reps - Shoulder Internal Rotation Reactive Isometrics  - 1 x daily - 4-7 x weekly - 2 sets - 10 reps  Treatment priorities   Eval                                                  ASSESSMENT:  CLINICAL IMPRESSION:  07/13/2023:  Alandra tolerated session well with no adverse reaction.  Pt progressing as expected.  Able to progress to concentric ER with minimal increase in pain.  HEP updated.  EVAL:  Hellen is a 78 y.o. female who presents to clinic with signs and sxs consistent with L shoulder pain.  Consistent with physician impression of L R/C after fall in February.  Abrina will benefit from skilled therapy to address relevant deficits and improve comfort and function in daily tasks involving reaching and lifting.     OBJECTIVE IMPAIRMENTS: Pain, L shoulder strength, L shoulder ROM  ACTIVITY LIMITATIONS: reaching, lifting, housework, driving  PERSONAL FACTORS: See medical history and pertinent history   REHAB POTENTIAL: Fair possible full thickness tear  CLINICAL DECISION MAKING: Evolving/moderate complexity  EVALUATION COMPLEXITY: Moderate   GOALS:   SHORT TERM GOALS: Target date: 07/27/2023   Sonnet will be >75% HEP compliant to improve carryover between sessions and  facilitate independent management of condition  Evaluation: ongoing Goal status: INITIAL   LONG TERM GOALS: Target date: 08/24/2023   Candelaria will self report >/= 50% decrease in pain from evaluation to improve function in daily tasks  Evaluation/Baseline: 8/10 max pain Goal status: INITIAL   2.  Brinly will be able to braid her own hair, not limited by pain  Evaluation/Baseline: limited by pain and ROM Goal status: INITIAL   3.  Gizell will be able to reach repeatedly to first cabinet shelf with weight equivalent to plate, not limited by pain  Evaluation/Baseline: limited Goal status: INITIAL   4.  Rennee will demonstrate >110 degrees of active ROM in flexion to allow completion of activities involving reaching OH, not limited by pain  Evaluation/Baseline: 85 degrees Goal status: INITIAL   5. Alantra will show a >/= 20 pct improvement in her QUICK DASH score (MCID is 10% or ~5 pts) as a proxy for functional improvement   Evaluation/Baseline:  QuickDASH Score: 43.2 / 100 = 43.2 % Goal status: INITIAL    PLAN: PT FREQUENCY: 1-2x/week  PT DURATION: 8 weeks  PLANNED INTERVENTIONS:  97164- PT Re-evaluation, 97110-Therapeutic exercises, 97530- Therapeutic activity, [id number]- Neuromuscular re-education, 97535- Self Care, 16109- Manual therapy, [id number]- Gait training, [id number]- Aquatic Therapy, [id number]- Electrical stimulation (manual), [id number]- Vasopneumatic device, [id number]- Traction (mechanical), [id number]- Ionotophoresis 4mg /ml Dexamethasone, Taping, Dry Needling, Joint manipulation, and Spinal manipulation.   Athena Baltz PT, DPT 07/13/2023, 3:45 PM

## 2023-07-14 ENCOUNTER — Encounter: Payer: Self-pay | Admitting: Physical Therapy

## 2023-07-14 ENCOUNTER — Ambulatory Visit: Admitting: Physical Therapy

## 2023-07-14 DIAGNOSIS — M6281 Muscle weakness (generalized): Secondary | ICD-10-CM

## 2023-07-14 DIAGNOSIS — M25512 Pain in left shoulder: Secondary | ICD-10-CM | POA: Diagnosis not present

## 2023-07-14 NOTE — Therapy (Signed)
 OUTPATIENT PHYSICAL THERAPY DAILY NOTE   Patient Name: Terry Koch MRN: [id number] DOB:01/31/1946, 78 y.o., female Today's Date: 07/14/2023   PT End of Session - 07/14/23 1330     Visit Number 4    Number of Visits --   1-2x/week   Date for PT Re-Evaluation 08/24/23    Authorization Type MCR - Quick Dash    Progress Note Due on Visit 10    PT Start Time 1330    PT Stop Time 1411    PT Time Calculation (min) 41 min             Past Medical History:  Diagnosis Date   Arthritis    self report   Bone fracture    Right Foot   H/O hernia repair    History of hysterectomy    History of mammogram    Dr.Elizabeth Reola Casino   History of MRI    Dr.Elizabeth Reola Casino.   History of removal of skin mole 03/09/1997   Hyperlipidemia    Hypertension    Persistent atrial fibrillation (HCC)    Scalp hematoma    Wears glasses    Past Surgical History:  Procedure Laterality Date   ABDOMINAL HYSTERECTOMY     BREAST BIOPSY  03/09/1997   Dr.Finder   BREAST BIOPSY Right 11/07/2019   FIBROADENOMA WITH CALCIFICATIONS   BREAST BIOPSY Left 11/07/2019   FIBROADENOMA WITH CALCIFICATIONS   CARDIOVERSION N/A 12/25/2019   Procedure: CARDIOVERSION;  Surgeon: Knox Perl, MD;  Location: [facility] ENDOSCOPY;  Service: Cardiovascular;  Laterality: N/A;   CARDIOVERSION N/A 02/06/2020   Procedure: CARDIOVERSION;  Surgeon: Knox Perl, MD;  Location: [facility] ENDOSCOPY;  Service: Cardiovascular;  Laterality: N/A;   CARDIOVERSION N/A 06/10/2022   Procedure: CARDIOVERSION;  Surgeon: Olinda Bertrand, DO;  Location: [facility] ENDOSCOPY;  Service: Cardiovascular;  Laterality: N/A;   CARDIOVERSION N/A 02/01/2023   Procedure: CARDIOVERSION (CATH LAB);  Surgeon: Wendie Hamburg, MD;  Location: [facility] INVASIVE CV LAB;  Service: Cardiovascular;  Laterality: N/A;   CARDIOVERSION N/A 03/23/2023   Procedure: CARDIOVERSION;  Surgeon: Maudine Sos, MD;  Location: [facility] INVASIVE CV LAB;  Service: Cardiovascular;  Laterality: N/A;    CHOLECYSTECTOMY     ~ 6 yrs ago   COLONOSCOPY  2009   Dr.Chesley   CYSTECTOMY  09/04/2019   Dr.Gray   GALLBLADDER SURGERY  03/10/2015   Dr.Amid ( [place]  )   POLYPECTOMY     12 polyps removed at [facility]    VENTRAL HERNIA REPAIR  2016   Patient Active Problem List   Diagnosis Date Noted   Chronic left shoulder pain 06/15/2023   Chronic pain of right knee 06/15/2023   Encounter for monitoring amiodarone  therapy 03/19/2023   Hypercoagulable state due to persistent atrial fibrillation (HCC) 01/05/2023   Arthritis    Hyperlipidemia    Hypertension    Persistent atrial fibrillation (HCC)     PCP: Verma Gobble, NP  REFERRING PROVIDER: Wes Hamman, MD  THERAPY DIAG:  Left shoulder pain, unspecified chronicity  Muscle weakness  REFERRING DIAG: Chronic L shoulder pain  Rationale for Evaluation and Treatment:  Rehabilitation  SUBJECTIVE:  PERTINENT PAST HISTORY:  Afib, chronic R knee pain        PRECAUTIONS: None  WEIGHT BEARING RESTRICTIONS No  FALLS:  Has patient fallen in last 6 months? Yes, Number of falls: 1 fall resulting in L shoulder injury - "rushing"  MOI/History of condition:  Onset date: Early February 2025  SUBJECTIVE STATEMENT  07/14/2023:  Pt reports that her shoulder is doing about the same today.  EVAL: Terry Koch is a 78 y.o. female who presents to clinic with chief complaint of L shoulder pain which started she was rushing and fell down.  She caught her foot on a chair.  She grabbed a table on the way down and had significant pain since that time.  She is unable to lift her arm and has significant pain at night.  No fracture on x-ray.  Pain:  Are you having pain? Yes Pain location: L shoulder pain  NPRS scale:  2/10 to 8/10 Aggravating factors: reaching OH, shoulder movements Relieving factors: rest Pain description: intermittent Stage: Chronic 24 hour pattern: worse with activity and at night   Occupation: retired    Education administrator: NA  Hand Dominance: R   Patient Goals/Specific Activities: improve ROM and pain of L shoulder   OBJECTIVE:   DIAGNOSTIC FINDINGS:  X-ray:  IMPRESSION: No acute findings.   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     SENSATION: Light touch: Appears intact   UPPER EXTREMITY AROM:  ROM Right (Eval) Left (Eval) L 5/7  Shoulder flexion 125* 85* 110  Shoulder abduction 115* 85*   Shoulder internal rotation     Shoulder external rotation     Functional IR SI L3   Functional ER C7 Ear *   Shoulder extension     Elbow extension     Elbow flexion      (Blank rows = not tested, N = WNL, * = concordant pain with testing)  UPPER EXTREMITY MMT:  MMT Right (Eval) Left (Eval)  Shoulder flexion  2+* in available range  Shoulder abduction (C5)  2+* in available range  Shoulder ER  3+*  Shoulder IR  4*  Middle trapezius    Lower trapezius    Shoulder extension    Grip strength    Cervical flexion (C1,C2)    Cervical S/B (C3)    Shoulder shrug (C4)    Elbow flexion (C6)    Elbow ext (C7)    Thumb ext (C8)    Finger abd (T1)    Grossly     (Blank rows = not tested, score listed is out of 5 possible points.  N = WNL, D = diminished, C = clear for gross weakness with myotome testing, * = concordant pain with testing)   UPPER EXTREMITY PROM:  PROM Right (Eval) Left (Eval)  Shoulder flexion  120+*  Shoulder abduction    Shoulder internal rotation    Shoulder external rotation    Functional IR    Functional ER    Shoulder extension    Elbow extension    Elbow flexion     (Blank rows = not tested, N = WNL, * = concordant pain with testing)  SPECIAL TESTS: R/C test cluster: (+)    PATIENT SURVEYS:  QuickDASH Score: 43.2 / 100 = 43.2 %    TODAY'S TREATMENT:   [facility] Adult PT Treatment  07/14/2023:  Therapeutic Exercise: UBE - 3'/3' L1 Finger ladder - 10x Alternating biceps curl - 5# - 2x10 Standing  alternating row - Black TB - 2x10 Standing alternating shoulder ext - green TB - 2x15 Triceps ext - GTB - 2x15 Shoulder adduction - GTB - 2x15 ER/IR YTB/Black TB - 2x15  Therapeutic Activity  Pball OH lift - 2x10   Wall slide with lift off - 2x15  HOME EXERCISE PROGRAM: Access Code: [email or web address] URL: [email or web address] Date: 07/08/2023 Prepared  by: Lesleigh Rash  Exercises - Standing Row with Anchored Resistance  - 1 x daily - 7 x weekly - 2 sets - 10-15 reps - Shoulder extension with resistance - Neutral  - 1 x daily - 7 x weekly - 3 sets - 10 reps - Supine Shoulder Flexion Extension AAROM with Dowel  - 1 x daily - 4-7 x weekly - 2 sets - 10 reps - Shoulder External Rotation Reactive Isometrics  - 1 x daily - 4-7 x weekly - 2 sets - 10 reps - Shoulder Internal Rotation Reactive Isometrics  - 1 x daily - 4-7 x weekly - 2 sets - 10 reps  Treatment priorities   Eval                                                  ASSESSMENT:  CLINICAL IMPRESSION:  07/14/2023:  Arletha tolerated session well with no adverse reaction.  Pt shows significant improvement in flexion ROM with AROM to 110 today, though she does have difficulty getting past ~90 degrees.  She shows consistent improvement in tolerance to ER/IR with resistance and lower pain with OH reaching.    EVAL:  Aaria is a 78 y.o. female who presents to clinic with signs and sxs consistent with L shoulder pain.  Consistent with physician impression of L R/C after fall in February.  Kylie will benefit from skilled therapy to address relevant deficits and improve comfort and function in daily tasks involving reaching and lifting.     OBJECTIVE IMPAIRMENTS: Pain, L shoulder strength, L shoulder ROM  ACTIVITY LIMITATIONS: reaching, lifting, housework, driving  PERSONAL FACTORS: See medical history and pertinent history   REHAB POTENTIAL: Fair possible full thickness tear  CLINICAL DECISION MAKING:  Evolving/moderate complexity  EVALUATION COMPLEXITY: Moderate   GOALS:   SHORT TERM GOALS: Target date: 07/27/2023   Ladaisha will be >75% HEP compliant to improve carryover between sessions and facilitate independent management of condition  Evaluation: ongoing Goal status: INITIAL   LONG TERM GOALS: Target date: 08/24/2023   [place] will self report >/= 50% decrease in pain from evaluation to improve function in daily tasks  Evaluation/Baseline: 8/10 max pain Goal status: INITIAL   2.  Alaiza will be able to braid her own hair, not limited by pain  Evaluation/Baseline: limited by pain and ROM Goal status: INITIAL   3.  Liannah will be able to reach repeatedly to first cabinet shelf with weight equivalent to plate, not limited by pain  Evaluation/Baseline: limited Goal status: INITIAL   4.  Shary will demonstrate >110 degrees of active ROM in flexion to allow completion of activities involving reaching OH, not limited by pain  Evaluation/Baseline: 85 degrees Goal status: INITIAL   5. Zakariah will show a >/= 20 pct improvement in her QUICK DASH score (MCID is 10% or ~5 pts) as a proxy for functional improvement   Evaluation/Baseline:  QuickDASH Score: 43.2 / 100 = 43.2 % Goal status: INITIAL    PLAN: PT FREQUENCY: 1-2x/week  PT DURATION: 8 weeks  PLANNED INTERVENTIONS:  97164- PT Re-evaluation, 97110-Therapeutic exercises, 97530- Therapeutic activity, [id number]- Neuromuscular re-education, 97535- Self Care, 16109- Manual therapy, [id number]- Gait training, [id number]- Aquatic Therapy, [id number]- Electrical stimulation (manual), [id number]- Vasopneumatic device, [id number]- Traction (mechanical), [id number]- Ionotophoresis 4mg /ml Dexamethasone, Taping, Dry Needling, Joint manipulation, and Spinal manipulation.   Lesleigh Rash PT,  DPT 07/14/2023, 2:13 PM

## 2023-07-20 ENCOUNTER — Ambulatory Visit: Admitting: Physical Therapy

## 2023-07-20 ENCOUNTER — Encounter: Payer: Self-pay | Admitting: Physical Therapy

## 2023-07-20 DIAGNOSIS — M6281 Muscle weakness (generalized): Secondary | ICD-10-CM

## 2023-07-20 DIAGNOSIS — M25512 Pain in left shoulder: Secondary | ICD-10-CM

## 2023-07-20 NOTE — Therapy (Signed)
 OUTPATIENT PHYSICAL THERAPY DAILY NOTE   Patient Name: Terry Koch MRN: [id number] DOB:1945/07/20, 78 y.o., female Today's Date: 07/20/2023   PT End of Session - 07/20/23 1501     Visit Number 5    Number of Visits --   1-2x/week   Date for PT Re-Evaluation 08/24/23    Authorization Type MCR - Quick [employer]    Progress Note Due on Visit 10    PT Start Time 1500    PT Stop Time 1541    PT Time Calculation (min) 41 min             Past Medical History:  Diagnosis Date   Arthritis    self report   Bone fracture    Right Foot   H/O hernia repair    History of hysterectomy    History of mammogram    Dr.Elizabeth Reola Casino   History of MRI    Dr.Elizabeth Reola Casino.   History of removal of skin mole 03/09/1997   Hyperlipidemia    Hypertension    Persistent atrial fibrillation (HCC)    Scalp hematoma    Wears glasses    Past Surgical History:  Procedure Laterality Date   ABDOMINAL HYSTERECTOMY     BREAST BIOPSY  03/09/1997   Dr.Finder   BREAST BIOPSY Right 11/07/2019   FIBROADENOMA WITH CALCIFICATIONS   BREAST BIOPSY Left 11/07/2019   FIBROADENOMA WITH CALCIFICATIONS   CARDIOVERSION N/A 12/25/2019   Procedure: CARDIOVERSION;  Surgeon: Knox Perl, MD;  Location: [facility] ENDOSCOPY;  Service: Cardiovascular;  Laterality: N/A;   CARDIOVERSION N/A 02/06/2020   Procedure: CARDIOVERSION;  Surgeon: Knox Perl, MD;  Location: [facility] ENDOSCOPY;  Service: Cardiovascular;  Laterality: N/A;   CARDIOVERSION N/A 06/10/2022   Procedure: CARDIOVERSION;  Surgeon: Olinda Bertrand, DO;  Location: [facility] ENDOSCOPY;  Service: Cardiovascular;  Laterality: N/A;   CARDIOVERSION N/A 02/01/2023   Procedure: CARDIOVERSION (CATH LAB);  Surgeon: Wendie Hamburg, MD;  Location: [facility] INVASIVE CV LAB;  Service: Cardiovascular;  Laterality: N/A;   CARDIOVERSION N/A 03/23/2023   Procedure: CARDIOVERSION;  Surgeon: Maudine Sos, MD;  Location: [facility] INVASIVE CV LAB;  Service: Cardiovascular;  Laterality: N/A;    CHOLECYSTECTOMY     ~ 6 yrs ago   COLONOSCOPY  2009   Dr.Chesley   CYSTECTOMY  09/04/2019   Dr.Gray   GALLBLADDER SURGERY  03/10/2015   Dr.Amid ( [place]  )   POLYPECTOMY     12 polyps removed at [facility] GI    VENTRAL HERNIA REPAIR  2016   Patient Active Problem List   Diagnosis Date Noted   Chronic left shoulder pain 06/15/2023   Chronic pain of right knee 06/15/2023   Encounter for monitoring amiodarone  therapy 03/19/2023   Hypercoagulable state due to persistent atrial fibrillation (HCC) 01/05/2023   Arthritis    Hyperlipidemia    Hypertension    Persistent atrial fibrillation (HCC)     PCP: Verma Gobble, NP  REFERRING PROVIDER: Wes Hamman, MD  THERAPY DIAG:  Left shoulder pain, unspecified chronicity  Muscle weakness  REFERRING DIAG: Chronic L shoulder pain  Rationale for Evaluation and Treatment:  Rehabilitation  SUBJECTIVE:  PERTINENT PAST HISTORY:  Afib, chronic R knee pain        PRECAUTIONS: None  WEIGHT BEARING RESTRICTIONS No  FALLS:  Has patient fallen in last 6 months? Yes, Number of falls: 1 fall resulting in L shoulder injury - "rushing"  MOI/History of condition:  Onset date: Early February 2025  SUBJECTIVE STATEMENT  07/20/2023:  Pt reports that at times she feels she has progress, other times not.  EVAL: Terry Koch is a 78 y.o. female who presents to clinic with chief complaint of L shoulder pain which started she was rushing and fell down.  She caught her foot on a chair.  She grabbed a table on the way down and had significant pain since that time.  She is unable to lift her arm and has significant pain at night.  No fracture on x-ray.  Pain:  Are you having pain? Yes Pain location: L shoulder pain  NPRS scale:  2/10 to 8/10 Aggravating factors: reaching OH, shoulder movements Relieving factors: rest Pain description: intermittent Stage: Chronic 24 hour pattern: worse with activity and at night   Occupation:  retired   Education administrator: NA  Hand Dominance: R   Patient Goals/Specific Activities: improve ROM and pain of L shoulder   OBJECTIVE:   DIAGNOSTIC FINDINGS:  X-ray:  IMPRESSION: No acute findings.   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     SENSATION: Light touch: Appears intact   UPPER EXTREMITY AROM:  ROM Right (Eval) Left (Eval) L 5/7  Shoulder flexion 125* 85* 110  Shoulder abduction 115* 85*   Shoulder internal rotation     Shoulder external rotation     Functional IR SI L3   Functional ER C7 Ear *   Shoulder extension     Elbow extension     Elbow flexion      (Blank rows = not tested, N = WNL, * = concordant pain with testing)  UPPER EXTREMITY MMT:  MMT Right (Eval) Left (Eval)  Shoulder flexion  2+* in available range  Shoulder abduction (C5)  2+* in available range  Shoulder ER  3+*  Shoulder IR  4*  Middle trapezius    Lower trapezius    Shoulder extension    Grip strength    Cervical flexion (C1,C2)    Cervical S/B (C3)    Shoulder shrug (C4)    Elbow flexion (C6)    Elbow ext (C7)    Thumb ext (C8)    Finger abd (T1)    Grossly     (Blank rows = not tested, score listed is out of 5 possible points.  N = WNL, D = diminished, C = clear for gross weakness with myotome testing, * = concordant pain with testing)   UPPER EXTREMITY PROM:  PROM Right (Eval) Left (Eval)  Shoulder flexion  120+*  Shoulder abduction    Shoulder internal rotation    Shoulder external rotation    Functional IR    Functional ER    Shoulder extension    Elbow extension    Elbow flexion     (Blank rows = not tested, N = WNL, * = concordant pain with testing)  SPECIAL TESTS: R/C test cluster: (+)    PATIENT SURVEYS:  QuickDASH Score: 43.2 / 100 = 43.2 %    TODAY'S TREATMENT:   [facility] Adult PT Treatment  07/20/2023:  Therapeutic Exercise: UBE - 3'/3' L2 Finger ladder - 10x Alternating biceps curl - 5# -  2x10 Unilateral row - 7# - 2x15 Unilateral shoulder ext - 7# - 2x15 Triceps ext - 10# - 2x15 Shoulder adduction - GTB - 2x15 Seated ER with elbow supported @45  degrees  Therapeutic Activity    UE ranger - 2x10 - 31'' - flexion Scaption 2x10  HOME EXERCISE PROGRAM: Access Code: [email or web address] URL: [email or web address] Date: 07/20/2023 Prepared by: Francetta Innocent  Loxley Cibrian  Exercises - Standing Row with Anchored Resistance  - 1 x daily - 7 x weekly - 2 sets - 10-15 reps - Shoulder extension with resistance - Neutral  - 1 x daily - 7 x weekly - 3 sets - 10 reps - Shoulder Flexion Wall Slide with Towel  - 1 x daily - 4-7 x weekly - 2 sets - 10 reps - Seated Shoulder External Rotation in Abduction Supported with Dumbbell  - 1 x daily - 7 x weekly - 3 sets - 15 reps  Treatment priorities   Eval                                                  ASSESSMENT:  CLINICAL IMPRESSION:  07/20/2023:  Elsye tolerated session well with no adverse reaction.  Continue to focus on shoulder strengthening, mainly below 90 degrees.  Added in supported ER at 45 degrees which was tolerated well.  Updated HEP.  Pt shows improved tolerance to AAROM today with minima increase in pain.  EVAL:  Dashonna is a 78 y.o. female who presents to clinic with signs and sxs consistent with L shoulder pain.  Consistent with physician impression of L R/C after fall in February.  Bettejane will benefit from skilled therapy to address relevant deficits and improve comfort and function in daily tasks involving reaching and lifting.     OBJECTIVE IMPAIRMENTS: Pain, L shoulder strength, L shoulder ROM  ACTIVITY LIMITATIONS: reaching, lifting, housework, driving  PERSONAL FACTORS: See medical history and pertinent history   REHAB POTENTIAL: Fair possible full thickness tear  CLINICAL DECISION MAKING: Evolving/moderate complexity  EVALUATION COMPLEXITY: Moderate   GOALS:   SHORT TERM GOALS: Target  date: 07/27/2023   Saniyya will be >75% HEP compliant to improve carryover between sessions and facilitate independent management of condition  Evaluation: ongoing Goal status: INITIAL   LONG TERM GOALS: Target date: 08/24/2023   Elliot will self report >/= 50% decrease in pain from evaluation to improve function in daily tasks  Evaluation/Baseline: 8/10 max pain 5/13: 7/10 Goal status: Ongoing   2.  Jerris will be able to braid her own hair, not limited by pain  Evaluation/Baseline: limited by pain and ROM Goal status: INITIAL   3.  Emali will be able to reach repeatedly to first cabinet shelf with weight equivalent to plate, not limited by pain  Evaluation/Baseline: limited Goal status: INITIAL   4.  Carlisia will demonstrate >110 degrees of active ROM in flexion to allow completion of activities involving reaching OH, not limited by pain  Evaluation/Baseline: 85 degrees Goal status: INITIAL   5. Denora will show a >/= 20 pct improvement in her QUICK DASH score (MCID is 10% or ~5 pts) as a proxy for functional improvement   Evaluation/Baseline:  QuickDASH Score: 43.2 / 100 = 43.2 % Goal status: INITIAL    PLAN: PT FREQUENCY: 1-2x/week  PT DURATION: 8 weeks  PLANNED INTERVENTIONS:  97164- PT Re-evaluation, 97110-Therapeutic exercises, 97530- Therapeutic activity, [id number]- Neuromuscular re-education, 97535- Self Care, 87564- Manual therapy, [id number]- Gait training, [id number]- Aquatic Therapy, [id number]- Electrical stimulation (manual), [id number]- Vasopneumatic device, [id number]- Traction (mechanical), [id number]- Ionotophoresis 4mg /ml Dexamethasone, Taping, Dry Needling, Joint manipulation, and Spinal manipulation.   Jermale Crass PT, DPT 07/20/2023, 3:45 PM

## 2023-07-22 ENCOUNTER — Ambulatory Visit: Admitting: Physical Therapy

## 2023-07-23 ENCOUNTER — Ambulatory Visit: Admitting: Physical Therapy

## 2023-07-27 ENCOUNTER — Ambulatory Visit: Admitting: Physical Therapy

## 2023-07-27 ENCOUNTER — Encounter: Payer: Self-pay | Admitting: Physical Therapy

## 2023-07-27 DIAGNOSIS — M25512 Pain in left shoulder: Secondary | ICD-10-CM

## 2023-07-27 DIAGNOSIS — M6281 Muscle weakness (generalized): Secondary | ICD-10-CM

## 2023-07-27 NOTE — Therapy (Signed)
 OUTPATIENT PHYSICAL THERAPY DAILY NOTE   Patient Name: Terry Koch MRN: [id number] DOB:[birth year], 78 y.o., female Today's Date: 07/27/2023   PT End of Session - 07/27/23 1501     Visit Number 6    Number of Visits --   1-2x/week   Date for PT Re-Evaluation 08/24/23    Authorization Type MCR - Quick Dash    Progress Note Due on Visit 10    PT Start Time 1500    PT Stop Time 1541    PT Time Calculation (min) 41 min             Past Medical History:  Diagnosis Date   Arthritis    self report   Bone fracture    Right Foot   H/O hernia repair    History of hysterectomy    History of mammogram    Dr.Elizabeth Reola Casino   History of MRI    Dr.Elizabeth Reola Casino.   History of removal of skin mole 03/09/1997   Hyperlipidemia    Hypertension    Persistent atrial fibrillation (HCC)    Scalp hematoma    Wears glasses    Past Surgical History:  Procedure Laterality Date   ABDOMINAL HYSTERECTOMY     BREAST BIOPSY  03/09/1997   Dr.Finder   BREAST BIOPSY Right 11/07/2019   FIBROADENOMA WITH CALCIFICATIONS   BREAST BIOPSY Left 11/07/2019   FIBROADENOMA WITH CALCIFICATIONS   CARDIOVERSION N/A 12/25/2019   Procedure: CARDIOVERSION;  Surgeon: Knox Perl, MD;  Location: [facility] ENDOSCOPY;  Service: Cardiovascular;  Laterality: N/A;   CARDIOVERSION N/A 02/06/2020   Procedure: CARDIOVERSION;  Surgeon: Knox Perl, MD;  Location: [facility] ENDOSCOPY;  Service: Cardiovascular;  Laterality: N/A;   CARDIOVERSION N/A 06/10/2022   Procedure: CARDIOVERSION;  Surgeon: Olinda Bertrand, DO;  Location: [facility] ENDOSCOPY;  Service: Cardiovascular;  Laterality: N/A;   CARDIOVERSION N/A 02/01/2023   Procedure: CARDIOVERSION (CATH LAB);  Surgeon: Wendie Hamburg, MD;  Location: [facility] INVASIVE CV LAB;  Service: Cardiovascular;  Laterality: N/A;   CARDIOVERSION N/A 03/23/2023   Procedure: CARDIOVERSION;  Surgeon: Maudine Sos, MD;  Location: [facility] INVASIVE CV LAB;  Service: Cardiovascular;  Laterality: N/A;    CHOLECYSTECTOMY     ~ 6 yrs ago   COLONOSCOPY  2009   Dr.Chesley   CYSTECTOMY  09/04/2019   Dr.Gray   GALLBLADDER SURGERY  03/10/2015   Dr.Amid ( [place]  )   POLYPECTOMY     12 polyps removed at [facility] GI    VENTRAL HERNIA REPAIR  2016   Patient Active Problem List   Diagnosis Date Noted   Chronic left shoulder pain 06/15/2023   Chronic pain of right knee 06/15/2023   Encounter for monitoring amiodarone  therapy 03/19/2023   Hypercoagulable state due to persistent atrial fibrillation (HCC) 01/05/2023   Arthritis    Hyperlipidemia    Hypertension    Persistent atrial fibrillation (HCC)     PCP: Verma Gobble, NP  REFERRING PROVIDER: Wes Hamman, MD  THERAPY DIAG:  Left shoulder pain, unspecified chronicity  Muscle weakness  REFERRING DIAG: Chronic L shoulder pain  Rationale for Evaluation and Treatment:  Rehabilitation  SUBJECTIVE:  PERTINENT PAST HISTORY:  Afib, chronic R knee pain        PRECAUTIONS: None  WEIGHT BEARING RESTRICTIONS No  FALLS:  Has patient fallen in last 6 months? Yes, Number of falls: 1 fall resulting in L shoulder injury - "rushing"  MOI/History of condition:  Onset date: Early February 2025  SUBJECTIVE STATEMENT  07/27/2023:  Pt reports that she fell from an office chair and hit her shoulder.  EVAL: Terry Koch is a 78 y.o. female who presents to clinic with chief complaint of L shoulder pain which started she was rushing and fell down.  She caught her foot on a chair.  She grabbed a table on the way down and had significant pain since that time.  She is unable to lift her arm and has significant pain at night.  No fracture on x-ray.  Pain:  Are you having pain? Yes Pain location: L shoulder pain  NPRS scale:  2/10 to 8/10 Aggravating factors: reaching OH, shoulder movements Relieving factors: rest Pain description: intermittent Stage: Chronic 24 hour pattern: worse with activity and at night   Occupation:  retired   Education administrator: NA  Hand Dominance: R   Patient Goals/Specific Activities: improve ROM and pain of L shoulder   OBJECTIVE:   DIAGNOSTIC FINDINGS:  X-ray:  IMPRESSION: No acute findings.   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     SENSATION: Light touch: Appears intact   UPPER EXTREMITY AROM:  ROM Right (Eval) Left (Eval) L 5/7  Shoulder flexion 125* 85* 110  Shoulder abduction 115* 85*   Shoulder internal rotation     Shoulder external rotation     Functional IR SI L3   Functional ER C7 Ear *   Shoulder extension     Elbow extension     Elbow flexion      (Blank rows = not tested, N = WNL, * = concordant pain with testing)  UPPER EXTREMITY MMT:  MMT Right (Eval) Left (Eval)  Shoulder flexion  2+* in available range  Shoulder abduction (C5)  2+* in available range  Shoulder ER  3+*  Shoulder IR  4*  Middle trapezius    Lower trapezius    Shoulder extension    Grip strength    Cervical flexion (C1,C2)    Cervical S/B (C3)    Shoulder shrug (C4)    Elbow flexion (C6)    Elbow ext (C7)    Thumb ext (C8)    Finger abd (T1)    Grossly     (Blank rows = not tested, score listed is out of 5 possible points.  N = WNL, D = diminished, C = clear for gross weakness with myotome testing, * = concordant pain with testing)   UPPER EXTREMITY PROM:  PROM Right (Eval) Left (Eval)  Shoulder flexion  120+*  Shoulder abduction    Shoulder internal rotation    Shoulder external rotation    Functional IR    Functional ER    Shoulder extension    Elbow extension    Elbow flexion     (Blank rows = not tested, N = WNL, * = concordant pain with testing)  SPECIAL TESTS: R/C test cluster: (+)    PATIENT SURVEYS:  QuickDASH Score: 43.2 / 100 = 43.2 %    TODAY'S TREATMENT:   [facility] Adult PT Treatment  07/27/2023:  Therapeutic Exercise: UBE - 3'/3' L2 Finger ladder - 10x Alternating biceps curl - 5# -  2x10 Unilateral row - 10# - 2x15 Unilateral shoulder ext - 7# - 2x15 Triceps ext - 10# - 2x15 Shoulder adduction - #7 - 2x15 Seated ER with elbow supported @45  degrees  Therapeutic Activity    UE ranger - 2x10 - 32'' - flexion Scaption 2x10 Fwd OPH press - 2x10  HOME EXERCISE PROGRAM: Access Code: [email or web address] URL: [email or web address]  Date: 07/20/2023 Prepared by: Lesleigh Rash  Exercises - Standing Row with Anchored Resistance  - 1 x daily - 7 x weekly - 2 sets - 10-15 reps - Shoulder extension with resistance - Neutral  - 1 x daily - 7 x weekly - 3 sets - 10 reps - Shoulder Flexion Wall Slide with Towel  - 1 x daily - 4-7 x weekly - 2 sets - 10 reps - Seated Shoulder External Rotation in Abduction Supported with Dumbbell  - 1 x daily - 7 x weekly - 3 sets - 15 reps  Treatment priorities   Eval                                                  ASSESSMENT:  CLINICAL IMPRESSION:  07/27/2023:  Malani tolerated session well with no adverse reaction.  Pt continues to progress well with therapy with reduced pain and improved ability to complete reach [facility].  She still struggles with movements past shoulder height but this is improving.  We were able to increase load of previously completed exercises today and started to work on short lever arm AROM.  Will continue to progress as able.   EVAL:  Katlynne is a 79 y.o. female who presents to clinic with signs and sxs consistent with L shoulder pain.  Consistent with physician impression of L R/C after fall in February.  Jarah will benefit from skilled therapy to address relevant deficits and improve comfort and function in daily tasks involving reaching and lifting.     OBJECTIVE IMPAIRMENTS: Pain, L shoulder strength, L shoulder ROM  ACTIVITY LIMITATIONS: reaching, lifting, housework, driving  PERSONAL FACTORS: See medical history and pertinent history   REHAB POTENTIAL: Fair possible full thickness  tear  CLINICAL DECISION MAKING: Evolving/moderate complexity  EVALUATION COMPLEXITY: Moderate   GOALS:   SHORT TERM GOALS: Target date: 07/27/2023   Merilynn will be >75% HEP compliant to improve carryover between sessions and facilitate independent management of condition  Evaluation: ongoing Goal status: MET   LONG TERM GOALS: Target date: 08/24/2023   Kirsti will self report >/= 50% decrease in pain from evaluation to improve function in daily tasks  Evaluation/Baseline: 8/10 max pain 5/13: 7/10 Goal status: Ongoing   2.  Karilynn will be able to braid her own hair, not limited by pain  Evaluation/Baseline: limited by pain and ROM Goal status: INITIAL   3.  Aleksa will be able to reach repeatedly to first cabinet shelf with weight equivalent to plate, not limited by pain  Evaluation/Baseline: limited Goal status: INITIAL   4.  Shona will demonstrate >110 degrees of active ROM in flexion to allow completion of activities involving reaching OH, not limited by pain  Evaluation/Baseline: 85 degrees 5/20: 110 Goal status: MET   5. Evangelyne will show a >/= 20 pct improvement in her QUICK DASH score (MCID is 10% or ~5 pts) as a proxy for functional improvement   Evaluation/Baseline:  QuickDASH Score: 43.2 / 100 = 43.2 % Goal status: INITIAL    PLAN: PT FREQUENCY: 1-2x/week  PT DURATION: 8 weeks  PLANNED INTERVENTIONS:  97164- PT Re-evaluation, 97110-Therapeutic exercises, 97530- Therapeutic activity, [id number]- Neuromuscular re-education, 97535- Self Care, 16109- Manual therapy, [id number]- Gait training, [id number]- Aquatic Therapy, [id number]- Electrical stimulation (manual), [id number]- Vasopneumatic device, [id number]- Traction (mechanical), [id number]- Ionotophoresis 4mg /ml Dexamethasone, Taping, Dry Needling, Joint manipulation,  and Spinal manipulation.   Nhung Danko PT, DPT 07/27/2023, 4:00 PM

## 2023-07-29 ENCOUNTER — Ambulatory Visit: Admitting: Physical Therapy

## 2023-07-29 ENCOUNTER — Encounter: Payer: Self-pay | Admitting: Physical Therapy

## 2023-07-29 ENCOUNTER — Other Ambulatory Visit: Payer: Self-pay | Admitting: Nurse Practitioner

## 2023-07-29 DIAGNOSIS — M6281 Muscle weakness (generalized): Secondary | ICD-10-CM

## 2023-07-29 DIAGNOSIS — M25512 Pain in left shoulder: Secondary | ICD-10-CM

## 2023-07-29 NOTE — Therapy (Signed)
 OUTPATIENT PHYSICAL THERAPY DAILY NOTE   Patient Name: Terry Koch MRN: [id number] DOB:[birth year], 78 y.o., female Today's Date: 07/29/2023   PT End of Session - 07/29/23 1235     Visit Number 7    Number of Visits --   1-2x/week   Date for PT Re-Evaluation 08/24/23    Authorization Type MCR - Quick Dash    Progress Note Due on Visit 10    PT Start Time 1235    PT Stop Time 1315    PT Time Calculation (min) 40 min             Past Medical History:  Diagnosis Date   Arthritis    self report   Bone fracture    Right Foot   H/O hernia repair    History of hysterectomy    History of mammogram    Dr.Elizabeth Reola Casino   History of MRI    Dr.Elizabeth Reola Casino.   History of removal of skin mole 03/09/1997   Hyperlipidemia    Hypertension    Persistent atrial fibrillation (HCC)    Scalp hematoma    Wears glasses    Past Surgical History:  Procedure Laterality Date   ABDOMINAL HYSTERECTOMY     BREAST BIOPSY  03/09/1997   Dr.Finder   BREAST BIOPSY Right 11/07/2019   FIBROADENOMA WITH CALCIFICATIONS   BREAST BIOPSY Left 11/07/2019   FIBROADENOMA WITH CALCIFICATIONS   CARDIOVERSION N/A 12/25/2019   Procedure: CARDIOVERSION;  Surgeon: Knox Perl, MD;  Location: [facility] ENDOSCOPY;  Service: Cardiovascular;  Laterality: N/A;   CARDIOVERSION N/A 02/06/2020   Procedure: CARDIOVERSION;  Surgeon: Knox Perl, MD;  Location: [facility] ENDOSCOPY;  Service: Cardiovascular;  Laterality: N/A;   CARDIOVERSION N/A 06/10/2022   Procedure: CARDIOVERSION;  Surgeon: Olinda Bertrand, DO;  Location: [facility] ENDOSCOPY;  Service: Cardiovascular;  Laterality: N/A;   CARDIOVERSION N/A 02/01/2023   Procedure: CARDIOVERSION (CATH LAB);  Surgeon: Wendie Hamburg, MD;  Location: [facility] INVASIVE CV LAB;  Service: Cardiovascular;  Laterality: N/A;   CARDIOVERSION N/A 03/23/2023   Procedure: CARDIOVERSION;  Surgeon: Maudine Sos, MD;  Location: [facility] INVASIVE CV LAB;  Service: Cardiovascular;  Laterality: N/A;    CHOLECYSTECTOMY     ~ 6 yrs ago   COLONOSCOPY  2009   Dr.Chesley   CYSTECTOMY  09/04/2019   Dr.Gray   GALLBLADDER SURGERY  03/10/2015   Dr.Amid ( [place]  )   POLYPECTOMY     12 polyps removed at [facility]    VENTRAL HERNIA REPAIR  2016   Patient Active Problem List   Diagnosis Date Noted   Chronic left shoulder pain 06/15/2023   Chronic pain of right knee 06/15/2023   Encounter for monitoring amiodarone  therapy 03/19/2023   Hypercoagulable state due to persistent atrial fibrillation (HCC) 01/05/2023   Arthritis    Hyperlipidemia    Hypertension    Persistent atrial fibrillation (HCC)     PCP: Verma Gobble, NP  REFERRING PROVIDER: Wes Hamman, MD  THERAPY DIAG:  Left shoulder pain, unspecified chronicity  Muscle weakness  REFERRING DIAG: Chronic L shoulder pain  Rationale for Evaluation and Treatment:  Rehabilitation  SUBJECTIVE:  PERTINENT PAST HISTORY:  Afib, chronic R knee pain        PRECAUTIONS: None  WEIGHT BEARING RESTRICTIONS No  FALLS:  Has patient fallen in last 6 months? Yes, Number of falls: 1 fall resulting in L shoulder injury - "rushing"  MOI/History of condition:  Onset date: Early February 2025  SUBJECTIVE STATEMENT  07/29/2023:  Pt reports that her shoulder is feeling much better.  EVAL: Terry Koch is a 78 y.o. female who presents to clinic with chief complaint of L shoulder pain which started she was rushing and fell down.  She caught her foot on a chair.  She grabbed a table on the way down and had significant pain since that time.  She is unable to lift her arm and has significant pain at night.  No fracture on x-ray.  Pain:  Are you having pain? Yes Pain location: L shoulder pain  NPRS scale:  2/10 to 8/10 Aggravating factors: reaching OH, shoulder movements Relieving factors: rest Pain description: intermittent Stage: Chronic 24 hour pattern: worse with activity and at night   Occupation: retired    Education administrator: NA  Hand Dominance: R   Patient Goals/Specific Activities: improve ROM and pain of L shoulder   OBJECTIVE:   DIAGNOSTIC FINDINGS:  X-ray:  IMPRESSION: No acute findings.   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     SENSATION: Light touch: Appears intact   UPPER EXTREMITY AROM:  ROM Right (Eval) Left (Eval) L 5/7  Shoulder flexion 125* 85* 110  Shoulder abduction 115* 85*   Shoulder internal rotation     Shoulder external rotation     Functional IR SI L3   Functional ER C7 Ear *   Shoulder extension     Elbow extension     Elbow flexion      (Blank rows = not tested, N = WNL, * = concordant pain with testing)  UPPER EXTREMITY MMT:  MMT Right (Eval) Left (Eval)  Shoulder flexion  2+* in available range  Shoulder abduction (C5)  2+* in available range  Shoulder ER  3+*  Shoulder IR  4*  Middle trapezius    Lower trapezius    Shoulder extension    Grip strength    Cervical flexion (C1,C2)    Cervical S/B (C3)    Shoulder shrug (C4)    Elbow flexion (C6)    Elbow ext (C7)    Thumb ext (C8)    Finger abd (T1)    Grossly     (Blank rows = not tested, score listed is out of 5 possible points.  N = WNL, D = diminished, C = clear for gross weakness with myotome testing, * = concordant pain with testing)   UPPER EXTREMITY PROM:  PROM Right (Eval) Left (Eval)  Shoulder flexion  120+*  Shoulder abduction    Shoulder internal rotation    Shoulder external rotation    Functional IR    Functional ER    Shoulder extension    Elbow extension    Elbow flexion     (Blank rows = not tested, N = WNL, * = concordant pain with testing)  SPECIAL TESTS: R/C test cluster: (+)    PATIENT SURVEYS:  QuickDASH Score: 43.2 / 100 = 43.2 %    TODAY'S TREATMENT:   [facility] Adult PT Treatment  07/29/2023:  Therapeutic Exercise: UBE - 3'/3' L2 Finger ladder - 10x Seated low row - 20# - 2x10 High - x10 Unilateral  shoulder ext - 7# - 2x15 Triceps ext - 10# - 2x15 Shoulder adduction - #7 - 2x15 Seated ER with elbow supported @45  degrees Lateral band walks - RTB - 2x10  Therapeutic Activity    UE ranger - 2x10 - 34'' - flexion Scaption 2x10 Fwd OPH press - 2x10 Repeated reaching into first then second shelf - 3x10  HOME EXERCISE PROGRAM: Access Code: [email or web address] URL: [email or web address] Date: 07/20/2023 Prepared by: Lesleigh Rash  Exercises - Standing Row with Anchored Resistance  - 1 x daily - 7 x weekly - 2 sets - 10-15 reps - Shoulder extension with resistance - Neutral  - 1 x daily - 7 x weekly - 3 sets - 10 reps - Shoulder Flexion Wall Slide with Towel  - 1 x daily - 4-7 x weekly - 2 sets - 10 reps - Seated Shoulder External Rotation in Abduction Supported with Dumbbell  - 1 x daily - 7 x weekly - 3 sets - 15 reps  Treatment priorities   Eval                                                  ASSESSMENT:  CLINICAL IMPRESSION:  07/29/2023:  Terry Koch tolerated session well with no adverse reaction.  Continued good progression of strength with reduced pain complaint.  She was able to reach repeatedly to second shelf in cabinet with minor increase in pain today.  Will continue to progress as able.   EVAL:  Terry Koch is a 78 y.o. female who presents to clinic with signs and sxs consistent with L shoulder pain.  Consistent with physician impression of L R/C after fall in February.  Terry Koch will benefit from skilled therapy to address relevant deficits and improve comfort and function in daily tasks involving reaching and lifting.     OBJECTIVE IMPAIRMENTS: Pain, L shoulder strength, L shoulder ROM  ACTIVITY LIMITATIONS: reaching, lifting, housework, driving  PERSONAL FACTORS: See medical history and pertinent history   REHAB POTENTIAL: Fair possible full thickness tear  CLINICAL DECISION MAKING: Evolving/moderate complexity  EVALUATION COMPLEXITY:  Moderate   GOALS:   SHORT TERM GOALS: Target date: 07/27/2023   Reverie will be >75% HEP compliant to improve carryover between sessions and facilitate independent management of condition  Evaluation: ongoing Goal status: MET   LONG TERM GOALS: Target date: 08/24/2023   Terry Koch will self report >/= 50% decrease in pain from evaluation to improve function in daily tasks  Evaluation/Baseline: 8/10 max pain 5/13: 7/10 Goal status: Ongoing   2.  Terry Koch will be able to braid her own hair, not limited by pain  Evaluation/Baseline: limited by pain and ROM Goal status: INITIAL   3.  Terry Koch will be able to reach repeatedly to first cabinet shelf with weight equivalent to plate, not limited by pain  Evaluation/Baseline: limited Goal status: INITIAL   4.  Terry Koch will demonstrate >110 degrees of active ROM in flexion to allow completion of activities involving reaching OH, not limited by pain  Evaluation/Baseline: 85 degrees 5/20: 110 Goal status: MET   5. Terry Koch will show a >/= 20 pct improvement in her QUICK DASH score (MCID is 10% or ~5 pts) as a proxy for functional improvement   Evaluation/Baseline:  QuickDASH Score: 43.2 / 100 = 43.2 % Goal status: INITIAL    PLAN: PT FREQUENCY: 1-2x/week  PT DURATION: 8 weeks  PLANNED INTERVENTIONS:  97164- PT Re-evaluation, 97110-Therapeutic exercises, 97530- Therapeutic activity, [id number]- Neuromuscular re-education, 97535- Self Care, 91478- Manual therapy, [id number]- Gait training, [id number]- Aquatic Therapy, [id number]- Electrical stimulation (manual), [id number]- Vasopneumatic device, [id number]- Traction (mechanical), [id number]- Ionotophoresis 4mg /ml Dexamethasone, Taping, Dry Needling, Joint manipulation, and Spinal manipulation.   Osiris Charles PT, DPT 07/29/2023, 1:20 PM

## 2023-08-03 ENCOUNTER — Telehealth: Payer: Self-pay

## 2023-08-03 NOTE — Telephone Encounter (Signed)
 [facility] Pre-operative Risk Assessment     Request for surgical clearance:     Endoscopy Procedure  What type of surgery is being performed?     COLONOSCOPY  When is this surgery scheduled?   10-27-23  What type of clearance is required ?   Pharmacy AND CARDIAC CLEARANCE  Are there any medications that need to be held prior to surgery and how long? ELIQUIS  2 DAYS  Practice name and name of physician performing surgery? [facility]  What is your office phone and fax number?      Phone- [phone]  Fax- [phone]  Anesthesia type (None, local, MAC, general) ?       MAC   THANK YOU Please route your response to Dymphna Wadley, CMA

## 2023-08-03 NOTE — Telephone Encounter (Signed)
 Called and spoke to patient.  She believes she is stable.  She has an appointment with [facility], Dr. Berry Bristol, on 8-12. Will schedule her procedure for after this date and send letter to Dr. Berry Bristol (PO[facility]) for cardiac clearance and clearance to hold Eliquis  for 2 days prior to procedure. PV scheduled for Wed, 8-13 by telephone per patient request

## 2023-08-03 NOTE — Telephone Encounter (Signed)
   Name: Terry Koch  DOB: 28-Sep-1945  MRN: [id number]  Primary Cardiologist: Knox Perl, MD  Chart reviewed as part of pre-operative protocol coverage. The patient has an upcoming visit scheduled with Dr. Belma Boxer on 10/19/2023 at which time clearance can be addressed in case there are any issues that would impact surgical recommendations.  I added preop FYI to appointment note so that provider is aware to address at time of outpatient visit.  Per office protocol the cardiology provider should forward their finalized clearance decision and recommendations regarding antiplatelet therapy to the requesting party below.    This message will also be routed to pharmacy pool for input on holding Eliquis  as requested below so that this information is available to the clearing provider at time of patient's appointment.   I will route this message as FYI to requesting party and remove this message from the preop box as separate preop APP input not needed at this time.   Please call with any questions.  Francene Ing, Retha Cast, NP  08/03/2023, 11:44 AM

## 2023-08-03 NOTE — Telephone Encounter (Signed)
-----   Message from [facility] H sent at 02/25/2023  8:50 AM EST ----- Regarding: chart check Patient seen in the office 12-19.  Needing colon but not ready to schedule due to persistent a fib, etc.  Call patient and see if she is stable.  If so, schedule Colon - send hold Eliquis  clearance request

## 2023-08-04 ENCOUNTER — Other Ambulatory Visit: Payer: Self-pay | Admitting: Cardiology

## 2023-08-04 DIAGNOSIS — I480 Paroxysmal atrial fibrillation: Secondary | ICD-10-CM

## 2023-08-04 NOTE — Telephone Encounter (Signed)
 Patient with diagnosis of atrial fibrillation on Eliquis  for anticoagulation.    What type of surgery is being performed?     COLONOSCOPY  When is this surgery scheduled?   10-27-23    CHA2DS2-VASc Score = 4   This indicates a 4.8% annual risk of stroke. The patient's score is based upon: CHF History: 0 HTN History: 1 Diabetes History: 0 Stroke History: 0 Vascular Disease History: 0 Age Score: 2 Gender Score: 1   CrCl 78 Platelet count 238  Patient has not had an Afib/aflutter ablation within the last 3 months or DCCV within the last 30 days  (NOTE: pt has has multiple DCCV, last was 04/02/23)  Per office protocol, patient can hold Eliquis  for 2 days prior to procedure.   Patient will not need bridging with Lovenox (enoxaparin) around procedure.  **This guidance is not considered finalized until pre-operative APP has relayed final recommendations.**

## 2023-08-04 NOTE — Telephone Encounter (Signed)
 Pt last saw Dr Berry Bristol 04/09/23, last labs 03/19/23 Creat 1.16, age 79, weight 124.3kg, basedf on specified criteria pt is on appropriate dosage of Eliquis  5mg  BID for afib.  Will refill rx.

## 2023-08-18 ENCOUNTER — Ambulatory Visit: Attending: Orthopaedic Surgery | Admitting: Physical Therapy

## 2023-08-18 ENCOUNTER — Encounter: Payer: Self-pay | Admitting: Physical Therapy

## 2023-08-18 DIAGNOSIS — M6281 Muscle weakness (generalized): Secondary | ICD-10-CM | POA: Insufficient documentation

## 2023-08-18 DIAGNOSIS — M25512 Pain in left shoulder: Secondary | ICD-10-CM | POA: Diagnosis present

## 2023-08-18 NOTE — Telephone Encounter (Signed)
 Called and spoke to patient. She understands to hold her Eliquis  for 2 days prior to her procedure. She has an appointment with Cardiology on 8-12.  PV on 8-13

## 2023-08-18 NOTE — Therapy (Addendum)
 PHYSICAL THERAPY UNPLANNED DISCHARGE SUMMARY   Visits from Start of Care: 8  Current functional level related to goals / functional outcomes: Current status unknown   Remaining deficits: Current status unknown   Education / Equipment: Pt has not returned since visit listed below  Patient goals were not assessed. Patient is being discharged due to not returning since the last visit.  (the note below was addended to include the above D/C summary on 02/22/2024)   Patient Name: Terry Koch MRN: [id number] DOB:[birth year], 78 y.o., female Today's Date: 08/18/2023   PT End of Session - 08/18/23 1544     Visit Number 8    Number of Visits --   1-2x/week   Date for PT Re-Evaluation 08/24/23    Authorization Type MCR - Quick Dash    Progress Note Due on Visit 10    PT Start Time 1545    PT Stop Time 1626    PT Time Calculation (min) 41 min             Past Medical History:  Diagnosis Date   Arthritis    self report   Bone fracture    Right Foot   H/O hernia repair    History of hysterectomy    History of mammogram    Dr.Elizabeth Koch   History of MRI    Dr.Elizabeth Koch.   History of removal of skin mole 03/09/1997   Hyperlipidemia    Hypertension    Persistent atrial fibrillation (HCC)    Scalp hematoma    Wears glasses    Past Surgical History:  Procedure Laterality Date   ABDOMINAL HYSTERECTOMY     BREAST BIOPSY  03/09/1997   Terry Koch   BREAST BIOPSY Right 11/07/2019   FIBROADENOMA WITH CALCIFICATIONS   BREAST BIOPSY Left 11/07/2019   FIBROADENOMA WITH CALCIFICATIONS   CARDIOVERSION N/A 12/25/2019   Procedure: CARDIOVERSION;  Surgeon: Terry Heinz, MD;  Location: [facility] ENDOSCOPY;  Service: Cardiovascular;  Laterality: N/A;   CARDIOVERSION N/A 02/06/2020   Procedure: CARDIOVERSION;  Surgeon: Terry Heinz, MD;  Location: [facility] ENDOSCOPY;  Service: Cardiovascular;  Laterality: N/A;   CARDIOVERSION N/A 06/10/2022   Procedure: CARDIOVERSION;  Surgeon:  Terry Richardson, DO;  Location: [facility] ENDOSCOPY;  Service: Cardiovascular;  Laterality: N/A;   CARDIOVERSION N/A 02/01/2023   Procedure: CARDIOVERSION (CATH LAB);  Surgeon: Terry Lonni CROME, MD;  Location: [facility] INVASIVE CV LAB;  Service: Cardiovascular;  Laterality: N/A;   CARDIOVERSION N/A 03/23/2023   Procedure: CARDIOVERSION;  Surgeon: Terry Riggs, MD;  Location: [facility] INVASIVE CV LAB;  Service: Cardiovascular;  Laterality: N/A;   CHOLECYSTECTOMY     ~ 6 yrs ago   COLONOSCOPY  2009   Terry Koch   CYSTECTOMY  09/04/2019   Terry Koch   GALLBLADDER SURGERY  03/10/2015   Terry Koch ( [place]  )   POLYPECTOMY     12 polyps removed at [facility] [place] [facility]    VENTRAL HERNIA REPAIR  2016   Patient Active Problem List   Diagnosis Date Noted   Chronic left shoulder pain 06/15/2023   Chronic pain of right knee 06/15/2023   Encounter for monitoring amiodarone  therapy 03/19/2023   Hypercoagulable state due to persistent atrial fibrillation (HCC) 01/05/2023   Arthritis    Hyperlipidemia    Hypertension    Persistent atrial fibrillation (HCC)     PCP: Terry Harlene POUR, NP  REFERRING PROVIDER: Caro Harlene POUR, NP  THERAPY DIAG:  Left shoulder pain, unspecified chronicity  Muscle weakness  REFERRING DIAG: Chronic L shoulder  pain  Rationale for Evaluation and Treatment:  Rehabilitation  SUBJECTIVE:  PERTINENT PAST HISTORY:  Afib, chronic R knee pain        PRECAUTIONS: None  WEIGHT BEARING RESTRICTIONS No  FALLS:  Has patient fallen in last 6 months? Yes, Number of falls: 1 fall resulting in L shoulder injury - rushing  MOI/History of condition:  Onset date: Early February 2025  SUBJECTIVE STATEMENT  08/18/2023:  Pt reports that she as been having more shoulder pain after her trip.  She feel like she may have overdone things.  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  EVAL: Terry Koch is a 78 y.o. female who presents to clinic  with chief complaint of L shoulder pain which started she was rushing and fell down.  She caught her foot on a chair.  She grabbed a table on the way down and had significant pain since that time.  She is unable to lift her arm and has significant pain at night.  No fracture on x-ray.  Pain:  Are you having pain? Yes Pain location: L shoulder pain  NPRS scale:  2/10 to 8/10 Aggravating factors: reaching OH, shoulder movements Relieving factors: rest Pain description: intermittent Stage: Chronic 24 hour pattern: worse with activity and at night   Occupation: retired   Education Administrator: NA  Hand Dominance: R   Patient Goals/Specific Activities: improve ROM and pain of L shoulder   OBJECTIVE:   DIAGNOSTIC FINDINGS:  X-ray:  IMPRESSION: No acute findings.   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     SENSATION: Light touch: Appears intact   UPPER EXTREMITY AROM:  ROM Right (Eval) Left (Eval) L 5/7  Shoulder flexion 125* 85* 110  Shoulder abduction 115* 85*   Shoulder internal rotation     Shoulder external rotation     Functional IR SI L3   Functional ER C7 Ear *   Shoulder extension     Elbow extension     Elbow flexion      (Blank rows = not tested, N = WNL, * = concordant pain with testing)  UPPER EXTREMITY MMT:  MMT Right (Eval) Left (Eval)  Shoulder flexion  2+* in available range  Shoulder abduction (C5)  2+* in available range  Shoulder ER  3+*  Shoulder IR  4*  Middle trapezius    Lower trapezius    Shoulder extension    Grip strength    Cervical flexion (C1,C2)    Cervical S/B (C3)    Shoulder shrug (C4)    Elbow flexion (C6)    Elbow ext (C7)    Thumb ext (C8)    Finger abd (T1)    Grossly     (Blank rows = not tested, score listed is out of 5 possible points.  N = WNL, D = diminished, C = clear for gross weakness with myotome testing, * = concordant pain with testing)   UPPER EXTREMITY PROM:  PROM  Right (Eval) Left (Eval)  Shoulder flexion  120+*  Shoulder abduction    Shoulder internal rotation    Shoulder external rotation    Functional IR    Functional ER    Shoulder extension    Elbow extension    Elbow flexion     (Blank rows = not tested, N = WNL, * = concordant pain with testing)  SPECIAL TESTS: R/C test cluster: (+)    PATIENT SURVEYS:  QuickDASH Score: 43.2 /  100 = 43.2 %    TODAY'S TREATMENT:   [facility] Adult PT Treatment  08/18/2023:  Therapeutic Exercise: UBE - 3'/3' L2 Finger ladder - 10x Seated low row - 20# - 2x10 High - x10 Triceps ext - 10# - 2x15 Shoulder adduction - #7 - 2x10 Ball roll up wal Lateral band walks - RTB - 2x10   UE ranger - 2x10 - 34'' - flexion Scaption 2x10  HOME EXERCISE PROGRAM: Access Code: [email or web address] URL: [email or web address] Date: 07/20/2023 Prepared by: Helene Gasmen  Exercises - Standing Row with Anchored Resistance  - 1 x daily - 7 x weekly - 2 sets - 10-15 reps - Shoulder extension with resistance - Neutral  - 1 x daily - 7 x weekly - 3 sets - 10 reps - Shoulder Flexion Wall Slide with Towel  - 1 x daily - 4-7 x weekly - 2 sets - 10 reps - Seated Shoulder External Rotation in Abduction Supported with Dumbbell  - 1 x daily - 7 x weekly - 3 sets - 15 reps  Treatment priorities   Eval                                                  ASSESSMENT:  CLINICAL IMPRESSION:  08/18/2023:  Flordia tolerated session well with no adverse reaction.  Dalyce returns to PT after 2+ week break for travel.  She has been helping her daughter and church and feels she has agg'd her shoulder pain.  We regressed overall volume but maintained similar intensity to past sessions.  Discussed the importance of rest when shoulder is flared and I encouraged her to take a couple days off before resuming HEP.  EVAL:  Fleda is a 78 y.o. female who presents to clinic with signs and sxs consistent with L shoulder  pain.  Consistent with physician impression of L R/C after fall in February.  Kassia will benefit from skilled therapy to address relevant deficits and improve comfort and function in daily tasks involving reaching and lifting.     OBJECTIVE IMPAIRMENTS: Pain, L shoulder strength, L shoulder ROM  ACTIVITY LIMITATIONS: reaching, lifting, housework, driving  PERSONAL FACTORS: See medical history and pertinent history   REHAB POTENTIAL: Fair possible full thickness tear  CLINICAL DECISION MAKING: Evolving/moderate complexity  EVALUATION COMPLEXITY: Moderate   GOALS:   SHORT TERM GOALS: Target date: 07/27/2023   Meher will be >75% HEP compliant to improve carryover between sessions and facilitate independent management of condition  Evaluation: ongoing Goal status: MET   LONG TERM GOALS: Target date: 08/24/2023   Wilna will self report >/= 50% decrease in pain from evaluation to improve function in daily tasks  Evaluation/Baseline: 8/10 max pain 5/13: 7/10 Goal status: Ongoing   2.  Reylene will be able to braid her own hair, not limited by pain  Evaluation/Baseline: limited by pain and ROM Goal status: INITIAL   3.  Rickiya will be able to reach repeatedly to first cabinet shelf with weight equivalent to plate, not limited by pain  Evaluation/Baseline: limited Goal status: INITIAL   4.  Carmencita will demonstrate >110 degrees of active ROM in flexion to allow completion of activities involving reaching OH, not limited by pain  Evaluation/Baseline: 85 degrees 5/20: 110 Goal status: MET   5. Jayce will show a >/= 20 pct improvement in her QUICK DASH score (  MCID is 10% or ~5 pts) as a proxy for functional improvement   Evaluation/Baseline:  QuickDASH Score: 43.2 / 100 = 43.2 % Goal status: INITIAL    PLAN: PT FREQUENCY: 1-2x/week  PT DURATION: 8 weeks  PLANNED INTERVENTIONS:  97164- PT Re-evaluation, 97110-Therapeutic exercises,  97530- Therapeutic activity, [id number]- Neuromuscular re-education, 97535- Self Care, 02859- Manual therapy, [id number]- Gait training, [id number]- Aquatic Therapy, [id number]- Electrical stimulation (manual), [id number]- Vasopneumatic device, [id number]- Traction (mechanical), [id number]- Ionotophoresis 4mg /ml Dexamethasone, Taping, Dry Needling, Joint manipulation, and Spinal manipulation.   Franciszek Platten PT, DPT 08/18/2023, 4:39 PM

## 2023-09-01 ENCOUNTER — Ambulatory Visit: Admitting: Physical Therapy

## 2023-10-19 ENCOUNTER — Encounter: Payer: Self-pay | Admitting: Cardiology

## 2023-10-19 ENCOUNTER — Ambulatory Visit: Attending: Cardiology | Admitting: Cardiology

## 2023-10-19 ENCOUNTER — Other Ambulatory Visit: Payer: Self-pay | Admitting: Nurse Practitioner

## 2023-10-19 VITALS — BP 126/65 | HR 58 | Resp 16 | Ht 67.0 in | Wt 275.5 lb

## 2023-10-19 DIAGNOSIS — I10 Essential (primary) hypertension: Secondary | ICD-10-CM | POA: Insufficient documentation

## 2023-10-19 DIAGNOSIS — I5032 Chronic diastolic (congestive) heart failure: Secondary | ICD-10-CM | POA: Diagnosis not present

## 2023-10-19 DIAGNOSIS — I480 Paroxysmal atrial fibrillation: Secondary | ICD-10-CM | POA: Diagnosis not present

## 2023-10-19 DIAGNOSIS — Z01810 Encounter for preprocedural cardiovascular examination: Secondary | ICD-10-CM | POA: Insufficient documentation

## 2023-10-19 NOTE — Progress Notes (Signed)
 Cardiology Office Note:  .   Date:  10/19/2023  ID:  Terry Koch, DOB 05/19/1945, MRN [id number] PCP: Caro Harlene POUR, NP  [facility] Providers Cardiologist:  Gordy Bergamo, MD Electrophysiologist:  Will Gladis Norton, MD   History of Present Illness: .   Terry Koch is a 78 y.o. African-American female with hypertension, hyperlipidemia, morbid obesity, colonic polyps requiring serial surveillance colonoscopy, no prior history of GI bleed, has had multiple direct-current cardioversions due to symptomatic atrial fibrillation, presently on amiodarone  and has been maintaining sinus rhythm.  Her last cardioversion was on 03/23/2023, amiodarone  dose has been increased from 100 mg daily to 200 mg daily.    She has been referred for weight management.  Patient is presently asymptomatic and has not had any further episodes of difficult palpitations.  She has an upcoming colonoscopy scheduled by Dr. Garnette Naval and would like to hold Eliquis  for 2 days.  Cardiac Studies relevent.    Exercise nuclear stress test 10/17/2019: Patient exercised for 5.1 METS and achieved 98% of MPHR, normal myocardial perfusion, EF 49%.  Low risk.  Echocardiogram 05/25/2023: Normal LV systolic function, EF 60 to 65%.  Grade 2 diastolic dysfunction. Left atrium is severely dilated.  Discussed the use of AI scribe software for clinical note transcription with the patient, who gave verbal consent to proceed.  History of Present Illness Terry Koch is a 78 year old female with atrial fibrillation who presents for cardiology clearance for a colonoscopy.  She has no recent episodes of atrial fibrillation and feels well. Her current medications include Eliquis  5 mg twice daily, metoprolol  25 mg daily, amiodarone  200 mg daily, spironolactone  25 mg daily, and valsartan  HCT 320/25 mg daily. During her last procedure, she experienced delayed awakening, which is a concern for her upcoming  colonoscopy.   Labs   Lab Results  Component Value Date   CHOL 148 10/16/2022   HDL 47 (L) 10/16/2022   LDLCALC 76 10/16/2022   TRIG 153 (H) 10/16/2022   CHOLHDL 3.1 10/16/2022   No results found for: LIPOA  Recent Labs    12/15/22 1704 01/27/23 1043 03/19/23 1030  NA 137 140 139  K 3.9 4.1 4.3  CL 101 105 105  CO2 24 27 23   GLUCOSE 96 97 132*  BUN 16 17 13   CREATININE 1.13* 1.12* 1.16*  CALCIUM  9.2 9.4 9.3  GFRNONAA 50* 51* 49*    Lab Results  Component Value Date   ALT 32 03/19/2023   AST 36 03/19/2023   ALKPHOS 49 03/19/2023   BILITOT 0.6 03/19/2023      Latest Ref Rng & Units 03/19/2023   10:30 AM 01/27/2023   10:43 AM 12/15/2022    5:04 PM  CBC  WBC 4.0 - 10.5 K/uL 5.4  4.5  6.0   Hemoglobin 12.0 - 15.0 g/dL 87.6  87.2  87.4   Hematocrit 36.0 - 46.0 % 40.1  40.6  39.3   Platelets 150 - 400 K/uL 238  207  195    Lab Results  Component Value Date   HGBA1C 6.3 (H) 06/07/2023    Lab Results  Component Value Date   TSH 1.344 03/19/2023    ROS  Review of Systems  Cardiovascular:  Negative for chest pain, dyspnea on exertion and leg swelling.   Physical Exam:   VS:  BP 126/65 (BP Location: Left Arm, Patient Position: Sitting, Cuff Size: Large)   Pulse (!) 58   Resp 16  Ht 5' 7 (1.702 m)   Wt 275 lb 8 oz (125 kg)   SpO2 94%   BMI 43.15 kg/m    Wt Readings from Last 3 Encounters:  10/19/23 275 lb 8 oz (125 kg)  06/07/23 274 lb (124.3 kg)  05/24/23 273 lb (123.8 kg)    BP Readings from Last 3 Encounters:  10/19/23 126/65  06/07/23 138/80  05/24/23 134/80   Physical Exam Constitutional:      Appearance: She is morbidly obese.  Neck:     Vascular: No carotid bruit or JVD.  Cardiovascular:     Rate and Rhythm: Normal rate and regular rhythm.     Pulses: Intact distal pulses.     Heart sounds: Normal heart sounds. No murmur heard.    No gallop.  Pulmonary:     Effort: Pulmonary effort is normal.     Breath sounds: Normal breath  sounds.  Abdominal:     General: Bowel sounds are normal.     Palpations: Abdomen is soft.  Musculoskeletal:     Right lower leg: No edema.     Left lower leg: No edema.    EKG:    EKG Interpretation Date/Time:  Tuesday October 19 2023 13:48:35 EDT Ventricular Rate:  59 PR Interval:  176 QRS Duration:  104 QT Interval:  434 QTC Calculation: 429 R Axis:   -34  Text Interpretation: EKG 10/19/2023: Normal sinus rhythm with rate of 59 beats forted, left anterior fascicular block.  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 LVH.  Compared to 04/06/2023, LVH, LAFB new.  Inferior T wave inversion no longer present. Confirmed by Marlei Glomski, Jagadeesh ([id number]) on 10/19/2023 2:02:42 PM    ASSESSMENT AND PLAN: .      ICD-10-CM   1. Paroxysmal atrial fibrillation (HCC)  I48.0 EKG 12-Lead    2. Chronic diastolic heart failure (HCC)  P49.67     3. Primary hypertension  I10     4. Pre-operative cardiovascular examination  Z01.810      Assessment & Plan Paroxysmal atrial fibrillation Atrial fibrillation is well-controlled with current medication regimen. No recent episodes reported. Maintains regular rhythm, reducing risk of shortness of breath and fluid accumulation associated with AFib episodes. Cleared for upcoming colonoscopy as it is a low-risk procedure from a cardiac standpoint. An anesthesiologist will be present to manage any potential complications related to AFib. - Continue Eliquis  5 mg twice a day - Continue metoprolol  succinate 25 mg once a day - Continue amiodarone  200 mg once a day  Chronic diastolic heart failure Heart failure symptoms are well-managed with current medications.  Patient develops acute decompensated heart failure during episodes of atrial fibrillation.  Fortunately she is maintaining sinus rhythm.  Regular rhythm maintenance is crucial to prevent heart failure symptoms such as shortness of breath and fluid accumulation. - Continue spironolactone  25 mg once a  day - Continue valsartan  HCT 320/25 mg once a day  Hypertension Hypertension is well-controlled with current medication regimen. Blood pressure is stable. - Continue valsartan  HCT 320/25 mg once a day  Preprocedural cardiac risk evaluation for upcoming endoscopy/colonoscopy Patient is low risk from cardiac standpoint.  Can hold Eliquis  for 48 hours and proceed with endoscopy.  Restart when appropriate.   Follow up: 1 year.  Signed,  Gordy Bergamo, MD, [facility] 10/19/2023, 6:04 PM [facility] [address] [place], [place] [address] Phone: [phone]. Fax:  [phone]

## 2023-10-19 NOTE — Patient Instructions (Signed)
 Medication Instructions:  Your physician recommends that you continue on your current medications as directed. Please refer to the Current Medication list given to you today.  *If you need a refill on your cardiac medications before your next appointment, please call your pharmacy*  Lab Work: none If you have labs (blood work) drawn today and your tests are completely normal, you will receive your results only by: MyChart Message (if you have MyChart) OR A paper copy in the mail If you have any lab test that is abnormal or we need to change your treatment, we will call you to review the results.  Testing/Procedures: none  Follow-Up: At [facility], you and your health needs are our priority.  As part of our continuing mission to provide you with exceptional heart care, our providers are all part of one team.  This team includes your primary Cardiologist (physician) and Advanced Practice Providers or APPs (Physician Assistants and Nurse Practitioners) who all work together to provide you with the care you need, when you need it.  Your next appointment:   12 month(s)  Provider:   Gordy Bergamo, MD    We recommend signing up for the patient portal called MyChart.  Sign up information is provided on this After Visit Summary.  MyChart is used to connect with patients for Virtual Visits (Telemedicine).  Patients are able to view lab/test results, encounter notes, upcoming appointments, etc.  Non-urgent messages can be sent to your provider as well.   To learn more about what you can do with MyChart, go to [email or web address].   Other Instructions

## 2023-10-20 ENCOUNTER — Ambulatory Visit

## 2023-10-20 ENCOUNTER — Telehealth: Payer: Self-pay | Admitting: *Deleted

## 2023-10-20 NOTE — Telephone Encounter (Signed)
 Verified with 2 ID Call to do PV with pt. Pt states she is heading out of town for family issue and she will not be able to to [facility] and does not desire to do colonoscopy dat that is scheduled.since it will be close to the time she will be back. Offered to given main office # for when she is ready to reschedule tpbut she states she can't take down a number at this time. Pt agreed for RN to cancel PV and colonoscopy so she can schedule when she is ready.

## 2023-10-20 NOTE — Progress Notes (Unsigned)
 SABRA

## 2023-10-27 ENCOUNTER — Encounter: Admitting: Gastroenterology

## 2023-11-04 ENCOUNTER — Other Ambulatory Visit (HOSPITAL_COMMUNITY): Payer: Self-pay | Admitting: *Deleted

## 2023-11-04 ENCOUNTER — Other Ambulatory Visit: Payer: Self-pay | Admitting: Nurse Practitioner

## 2023-11-04 ENCOUNTER — Other Ambulatory Visit: Payer: Self-pay | Admitting: Cardiology

## 2023-11-04 DIAGNOSIS — I4819 Other persistent atrial fibrillation: Secondary | ICD-10-CM

## 2023-11-04 MED ORDER — AMIODARONE HCL 200 MG PO TABS: 200.0000 mg | ORAL_TABLET | Freq: Every day | ORAL | 0 refills | Status: DC

## 2023-12-12 ENCOUNTER — Other Ambulatory Visit: Payer: Self-pay | Admitting: Cardiology

## 2023-12-12 DIAGNOSIS — I4819 Other persistent atrial fibrillation: Secondary | ICD-10-CM

## 2023-12-12 DIAGNOSIS — I480 Paroxysmal atrial fibrillation: Secondary | ICD-10-CM

## 2023-12-14 NOTE — Telephone Encounter (Signed)
 Prescription refill request for Eliquis  received. Indication:afib Last office visit:8/25 Scr:1.16  1/25 Age: 78 Weight:125  kg  Prescription refilled

## 2023-12-28 ENCOUNTER — Other Ambulatory Visit: Payer: Self-pay | Admitting: Cardiology

## 2023-12-28 DIAGNOSIS — I10 Essential (primary) hypertension: Secondary | ICD-10-CM

## 2024-01-04 ENCOUNTER — Ambulatory Visit: Admitting: Nurse Practitioner

## 2024-01-10 ENCOUNTER — Encounter: Payer: Self-pay | Admitting: Radiology

## 2024-01-14 ENCOUNTER — Encounter: Payer: Self-pay | Admitting: Nurse Practitioner

## 2024-01-14 ENCOUNTER — Ambulatory Visit: Payer: Self-pay | Admitting: Nurse Practitioner

## 2024-01-14 VITALS — BP 136/80 | HR 55 | Temp 97.4°F | Ht 67.0 in | Wt 274.0 lb

## 2024-01-14 DIAGNOSIS — E782 Mixed hyperlipidemia: Secondary | ICD-10-CM

## 2024-01-14 DIAGNOSIS — Z23 Encounter for immunization: Secondary | ICD-10-CM

## 2024-01-14 DIAGNOSIS — I480 Paroxysmal atrial fibrillation: Secondary | ICD-10-CM | POA: Diagnosis not present

## 2024-01-14 DIAGNOSIS — I5032 Chronic diastolic (congestive) heart failure: Secondary | ICD-10-CM

## 2024-01-14 DIAGNOSIS — M5431 Sciatica, right side: Secondary | ICD-10-CM

## 2024-01-14 DIAGNOSIS — I10 Essential (primary) hypertension: Secondary | ICD-10-CM

## 2024-01-14 DIAGNOSIS — R739 Hyperglycemia, unspecified: Secondary | ICD-10-CM | POA: Diagnosis not present

## 2024-01-14 NOTE — Progress Notes (Signed)
 Careteam: Patient Care Team: Caro Harlene POUR, NP as PCP - General (Geriatric Medicine) Ladona Heinz, MD as PCP - Cardiology (Cardiology) Inocencio Soyla Lunger, MD as PCP - Electrophysiology (Cardiology)  PLACE OF SERVICE:  [facility]  Advanced Directive information    Allergies  Allergen Reactions   Amlodipine  Other (See Comments)    Leg edema    Chief Complaint  Patient presents with   Follow-up    6 month follow up with labs Patient has concerns about not being able to sleep all night. Left side pain in head.    HPI:  Discussed the use of AI scribe software for clinical note transcription with the patient, who gave verbal consent to proceed.  History of Present Illness Terry Koch is a 77 year old female with atrial fibrillation who presents for a six-month follow-up.  She reports a feeling in her chest described as a 'fluttering' or 'tingling' sensation on the right side of her chest, occurring once or twice a month, usually when lying down. It does not feel like palpitations or heart racing and lasts briefly. No increase in chest pain with activity, no shortness of breath, and no palpitations.undescribable tingle in the right side of her chest she has had close follow up with cardiologist.   She mentions numbness on her right thigh, which has been a long-standing issue. The numbness is described as a 'repetitious periodic feeling' and is more prevalent than any other sensation. It does not extend to her left leg. She does not enjoy walking due to pain. Feels like it has gotten worse since she had a fall earlier in the year  She describes a new pain at the back of her right leg, particularly after standing for long periods, such as when cleaning out an apartment. This pain is located at the back of the knee, described as 'restrictive' and 'throbbing', and has been present for about two to three weeks. It is improving but was initially quite severe. No low back  pain, buttock pain, or pain radiating down the leg.  She has a history of atrial fibrillation, discovered during a colonoscopy in 2020. She has not had a cardioversion since December of the previous year, which is a positive change from her previous pattern of needing one every three to six months.  Also follows with cardiology due to chf is currently taking valsartan  hydrochlorothiazide , spironolactone , and metoprolol . No worsening LE edema or shortness of breath     Review of Systems:  Review of Systems  Constitutional:  Negative for chills, fever and weight loss.  HENT:  Negative for tinnitus.   Respiratory:  Negative for cough, sputum production and shortness of breath.   Cardiovascular:  Negative for chest pain, palpitations and leg swelling.  Gastrointestinal:  Negative for abdominal pain, constipation, diarrhea and heartburn.  Genitourinary:  Negative for dysuria, frequency and urgency.  Musculoskeletal:  Positive for joint pain and myalgias. Negative for back pain and falls.  Skin: Negative.   Neurological:  Positive for tingling. Negative for dizziness and headaches.  Psychiatric/Behavioral:  Negative for depression and memory loss. The patient does not have insomnia.     Past Medical History:  Diagnosis Date   Arthritis    self report   Bone fracture    Right Foot   H/O hernia repair    History of hysterectomy    History of mammogram    Dr.Elizabeth Gari   History of MRI    Dr.Elizabeth Gari.  History of removal of skin mole 03/09/1997   Hyperlipidemia    Hypertension    Persistent atrial fibrillation (HCC)    Scalp hematoma    Wears glasses    Past Surgical History:  Procedure Laterality Date   ABDOMINAL HYSTERECTOMY     BREAST BIOPSY  03/09/1997   Dr.Finder   BREAST BIOPSY Right 11/07/2019   FIBROADENOMA WITH CALCIFICATIONS   BREAST BIOPSY Left 11/07/2019   FIBROADENOMA WITH CALCIFICATIONS   CARDIOVERSION N/A 12/25/2019   Procedure: CARDIOVERSION;   Surgeon: Ladona Heinz, MD;  Location: [facility] ENDOSCOPY;  Service: Cardiovascular;  Laterality: N/A;   CARDIOVERSION N/A 02/06/2020   Procedure: CARDIOVERSION;  Surgeon: Ladona Heinz, MD;  Location[facility] ENDOSCOPY;  Service: Cardiovascular;  Laterality: N/A;   CARDIOVERSION N/A 06/10/2022   Procedure: CARDIOVERSION;  Surgeon: Michele Richardson, DO;  Location: [facility] ENDOSCOPY;  Service: Cardiovascular;  Laterality: N/A;   CARDIOVERSION N/A 02/01/2023   Procedure: CARDIOVERSION (CATH LAB);  Surgeon: Kate Lonni CROME, MD;  Location: [facility] INVASIVE CV LAB;  Service: Cardiovascular;  Laterality: N/A;   CARDIOVERSION N/A 03/23/2023   Procedure: CARDIOVERSION;  Surgeon: Raford Riggs, MD;  Location: [facility] INVASIVE CV LAB;  Service: Cardiovascular;  Laterality: N/A;   CHOLECYSTECTOMY     ~ 6 yrs ago   COLONOSCOPY  2009   Dr.Chesley   CYSTECTOMY  09/04/2019   Dr.Gray   GALLBLADDER SURGERY  03/10/2015   Dr.Amid ( [place]  )   POLYPECTOMY     12 polyps removed at [facility]    VENTRAL HERNIA REPAIR  2016   Social History:   reports that she has never smoked. She has never used smokeless tobacco. She reports current alcohol use of about 1.0 standard drink of alcohol per week. She reports that she does not use drugs.  Family History  Problem Relation Age of Onset   Breast cancer Mother    Alzheimer's disease Mother    Dementia Mother    Parkinson's disease Mother    Colon polyps Mother    Stroke Father    Dementia Father    Breast cancer Sister    Allergies Daughter    Allergies Daughter    Allergies Son    Colon cancer Neg Hx    Esophageal cancer Neg Hx    Stomach cancer Neg Hx    Rectal cancer Neg Hx     Medications: Patient's Medications  New Prescriptions   No medications on file  Previous Medications   ACETAMINOPHEN  (TYLENOL ) 650 MG CR TABLET    Take 1,300 mg by mouth in the morning and at bedtime.   AMIODARONE  (PACERONE ) 200 MG TABLET    TAKE 1/2 TABLET BY MOUTH EVERY DAY   ELIQUIS  5 MG  TABS TABLET    TAKE 1 TABLET BY MOUTH TWICE DAILY   GABAPENTIN  (NEURONTIN ) 100 MG CAPSULE    1 cap po at bedtime x 3 nights, then increase to 2 caps if needed   GARLIC 1000 MG CAPS    Take 1,000 mg by mouth as needed. Periodically   LORATADINE (CLARITIN) 10 MG TABLET    Take 10 mg by mouth daily as needed for allergies or rhinitis.   METOPROLOL  SUCCINATE (TOPROL -XL) 25 MG 24 HR TABLET    Take 1 tablet (25 mg total) by mouth in the morning. With or immediately following a meal   MISC NATURAL PRODUCTS (ELDERBERRY IMMUNE COMPLEX PO)    Take 550 mg by mouth in the morning.   MULTIPLE MINERALS (CALCIUM /MAGNESIUM/ZINC PO)  Take 1 tablet by mouth daily. Vit d3   MULTIPLE VITAMINS-MINERALS (COMPLETE MULTIVITAMIN/MINERAL PO)    Take 1 tablet by mouth in the morning. One a day   MULTIPLE VITAMINS-MINERALS (EMERGEN-C IMMUNE PO)    Take 5 mLs by mouth daily as needed. With 8 oz of water   MULTIPLE VITAMINS-MINERALS (EMERGEN-C VITAMIN C) CHEW    Chew 2 Chip by mouth daily.   NON FORMULARY    Apply 1 application  topically as needed (ankle pain). Body balm with magnesium 12mg    OMEGA-3 KRILL OIL 500 MG CAPS    Take 500 mg by mouth in the morning.   OVER THE COUNTER MEDICATION    Apply 1 Application topically daily as needed (Pain). veterinary liniment gel   PSYLLIUM 60.3 % POWD    Take 5 mLs by mouth daily.   ROSUVASTATIN  (CRESTOR ) 10 MG TABLET    TAKE 1 TABLET(10 MG) BY MOUTH DAILY   SPIRONOLACTONE  (ALDACTONE ) 25 MG TABLET    Take 1 tablet (25 mg total) by mouth every morning.   VALSARTAN -HYDROCHLOROTHIAZIDE  (DIOVAN -HCT) 320-25 MG TABLET    TAKE 1 TABLET BY MOUTH EVERY MORNING   VITAMIN B-12 (CYANOCOBALAMIN) 1000 MCG TABLET    Take 1,000 mcg by mouth in the morning.  Modified Medications   No medications on file  Discontinued Medications   No medications on file    Physical Exam:  Vitals:   01/14/24 1248  BP: 136/80  Pulse: (!) 55  Temp: (!) 97.4 F (36.3 C)  SpO2: 98%  Weight: 274 lb (124.3  kg)  Height: 5' 7 (1.702 m)   Body mass index is 42.91 kg/m. Wt Readings from Last 3 Encounters:  01/14/24 274 lb (124.3 kg)  10/19/23 275 lb 8 oz (125 kg)  06/07/23 274 lb (124.3 kg)    Physical Exam Constitutional:      General: She is not in acute distress.    Appearance: She is well-developed. She is not diaphoretic.  HENT:     Head: Normocephalic and atraumatic.     Mouth/Throat:     Pharynx: No oropharyngeal exudate.  Eyes:     Conjunctiva/sclera: Conjunctivae normal.     Pupils: Pupils are equal, round, and reactive to light.  Cardiovascular:     Rate and Rhythm: Normal rate and regular rhythm.     Heart sounds: Normal heart sounds.  Pulmonary:     Effort: Pulmonary effort is normal.     Breath sounds: Normal breath sounds.  Abdominal:     General: Bowel sounds are normal.     Palpations: Abdomen is soft.  Musculoskeletal:     Cervical back: Normal range of motion and neck supple.     Right lower leg: No edema.     Left lower leg: No edema.  Skin:    General: Skin is warm and dry.  Neurological:     Mental Status: She is alert.     Motor: No weakness.     Gait: Gait normal.  Psychiatric:        Mood and Affect: Mood normal.     Labs reviewed: Basic Metabolic Panel: Recent Labs    01/27/23 1043 03/19/23 1030  NA 140 139  K 4.1 4.3  CL 105 105  CO2 27 23  GLUCOSE 97 132*  BUN 17 13  CREATININE 1.12* 1.16*  CALCIUM  9.4 9.3  TSH  --  1.344   Liver Function Tests: Recent Labs    03/19/23 1030  AST 36  ALT 32  ALKPHOS 49  BILITOT 0.6  PROT 7.0  ALBUMIN 3.8   No results for input(s): LIPASE, AMYLASE in the last 8760 hours. No results for input(s): AMMONIA in the last 8760 hours. CBC: Recent Labs    01/27/23 1043 03/19/23 1030  WBC 4.5 5.4  HGB 12.7 12.3  HCT 40.6 40.1  MCV 87.7 87.9  PLT 207 238   Lipid Panel: No results for input(s): CHOL, HDL, LDLCALC, TRIG, CHOLHDL, LDLDIRECT in the last 8760  hours. TSH: Recent Labs    03/19/23 1030  TSH 1.344   A1C: Lab Results  Component Value Date   HGBA1C 6.3 (H) 06/07/2023     Assessment/Plan  Assessment & Plan atrial fibrillation Intermittent chest tingling. No recent cardioversion Denies palpitations or pain Happens when sitting Continues on amiodarone  and metoprolol  - Ordered EKG showing sinus bradycardiac Continues follow up with cardiology  Essential hypertension Blood pressure well-controlled on current medication.  Mixed hyperlipidemia Overdue for cholesterol check. Continues on current medication. Continues on crestor  10 mg daily - Ordered cholesterol panel.  Morbid obesity due to excess calories BMI 42 Weight fluctuates between 270-274 lbs. Discussed weight loss medications, insurance coverage uncertain. - Discussed potential weight loss medications if insurance coverage changes.  Right-sided sciatica Chronic numbness and pain in right thigh, possibly lumbar spine related. - Ordered lumbar spine X-ray.  Hyperglycemia Prediabetic range - Ordered A1c test. Continue dietary modifications   Flu shot given today   Return in about 4 months (around 05/13/2024) for routine follow up.   Khloie Hamada K. Caro BODILY [facility] [phone]

## 2024-01-15 LAB — COMPREHENSIVE METABOLIC PANEL WITH GFR
AG Ratio: 1.6 (calc) (ref 1.0–2.5)
ALT: 21 U/L (ref 6–29)
AST: 26 U/L (ref 10–35)
Albumin: 4.2 g/dL (ref 3.6–5.1)
Alkaline phosphatase (APISO): 53 U/L (ref 37–153)
BUN/Creatinine Ratio: 16 (calc) (ref 6–22)
BUN: 21 mg/dL (ref 7–25)
CO2: 31 mmol/L (ref 20–32)
Calcium: 9.5 mg/dL (ref 8.6–10.4)
Chloride: 103 mmol/L (ref 98–110)
Creat: 1.31 mg/dL — ABNORMAL HIGH (ref 0.60–1.00)
Globulin: 2.6 g/dL (ref 1.9–3.7)
Glucose, Bld: 93 mg/dL (ref 65–139)
Potassium: 4.1 mmol/L (ref 3.5–5.3)
Sodium: 140 mmol/L (ref 135–146)
Total Bilirubin: 0.5 mg/dL (ref 0.2–1.2)
Total Protein: 6.8 g/dL (ref 6.1–8.1)
eGFR: 42 mL/min/1.73m2 — ABNORMAL LOW (ref >=60)

## 2024-01-15 LAB — HEMOGLOBIN A1C
Hgb A1c MFr Bld: 6.1 % — ABNORMAL HIGH (ref <5.7)
Mean Plasma Glucose: 128 mg/dL
eAG (mmol/L): 7.1 mmol/L

## 2024-01-15 LAB — CBC WITH DIFFERENTIAL/PLATELET
Absolute Lymphocytes: 1367 {cells}/uL (ref 850–3900)
Absolute Monocytes: 368 {cells}/uL (ref 200–950)
Basophils Absolute: 20 {cells}/uL (ref 0–200)
Basophils Relative: 0.4 %
Eosinophils Absolute: 69 {cells}/uL (ref 15–500)
Eosinophils Relative: 1.4 %
HCT: 34.9 % — ABNORMAL LOW (ref 35.0–45.0)
Hemoglobin: 11.1 g/dL — ABNORMAL LOW (ref 11.7–15.5)
MCH: 27.4 pg (ref 27.0–33.0)
MCHC: 31.8 g/dL — ABNORMAL LOW (ref 32.0–36.0)
MCV: 86.2 fL (ref 80.0–100.0)
MPV: 11 fL (ref 7.5–12.5)
Monocytes Relative: 7.5 %
Neutro Abs: 3077 {cells}/uL (ref 1500–7800)
Neutrophils Relative %: 62.8 %
Platelets: 190 Thousand/uL (ref 140–400)
RBC: 4.05 Million/uL (ref 3.80–5.10)
RDW: 13.7 % (ref 11.0–15.0)
Total Lymphocyte: 27.9 %
WBC: 4.9 Thousand/uL (ref 3.8–10.8)

## 2024-01-15 LAB — LIPID PANEL
Cholesterol: 154 mg/dL (ref <200)
HDL: 49 mg/dL — ABNORMAL LOW (ref >=50)
LDL Cholesterol (Calc): 82 mg/dL
Non-HDL Cholesterol (Calc): 105 mg/dL (ref <130)
Total CHOL/HDL Ratio: 3.1 (calc) (ref <5.0)
Triglycerides: 132 mg/dL (ref <150)

## 2024-01-15 LAB — TSH: TSH: 0.36 m[IU]/L — ABNORMAL LOW (ref 0.40–4.50)

## 2024-01-16 ENCOUNTER — Ambulatory Visit: Payer: Self-pay | Admitting: Nurse Practitioner

## 2024-01-17 NOTE — Progress Notes (Signed)
 Would recommend that she change Amiodarone  to 200 mg three times a week. Also would check complete thyroid  panel

## 2024-02-22 ENCOUNTER — Ambulatory Visit: Admitting: Nurse Practitioner

## 2024-02-22 ENCOUNTER — Other Ambulatory Visit

## 2024-02-22 ENCOUNTER — Telehealth: Payer: Self-pay

## 2024-02-22 ENCOUNTER — Encounter: Payer: Self-pay | Admitting: Nurse Practitioner

## 2024-02-22 VITALS — BP 116/48 | HR 65 | Ht 67.0 in | Wt 272.0 lb

## 2024-02-22 DIAGNOSIS — I4891 Unspecified atrial fibrillation: Secondary | ICD-10-CM

## 2024-02-22 DIAGNOSIS — D649 Anemia, unspecified: Secondary | ICD-10-CM

## 2024-02-22 DIAGNOSIS — Z860101 Personal history of adenomatous and serrated colon polyps: Secondary | ICD-10-CM | POA: Diagnosis not present

## 2024-02-22 DIAGNOSIS — Z860100 Personal history of colon polyps, unspecified: Secondary | ICD-10-CM | POA: Insufficient documentation

## 2024-02-22 LAB — CBC
HCT: 35.1 % — ABNORMAL LOW (ref 36.0–46.0)
Hemoglobin: 11.5 g/dL — ABNORMAL LOW (ref 12.0–15.0)
MCHC: 32.7 g/dL (ref 30.0–36.0)
MCV: 84.9 fl (ref 78.0–100.0)
Platelets: 189 K/uL (ref 150.0–400.0)
RBC: 4.13 Mil/uL (ref 3.87–5.11)
RDW: 14.4 % (ref 11.5–15.5)
WBC: 4.9 K/uL (ref 4.0–10.5)

## 2024-02-22 LAB — IBC + FERRITIN
Ferritin: 221.2 ng/mL (ref 10.0–291.0)
Iron: 76 ug/dL (ref 42–145)
Saturation Ratios: 21 % (ref 20.0–50.0)
TIBC: 361.2 ug/dL (ref 250.0–450.0)
Transferrin: 258 mg/dL (ref 212.0–360.0)

## 2024-02-22 LAB — COMPREHENSIVE METABOLIC PANEL WITH GFR
ALT: 26 U/L (ref 3–35)
AST: 27 U/L (ref 5–37)
Albumin: 4.4 g/dL (ref 3.5–5.2)
Alkaline Phosphatase: 61 U/L (ref 39–117)
BUN: 18 mg/dL (ref 6–23)
CO2: 31 meq/L (ref 19–32)
Calcium: 10.1 mg/dL (ref 8.4–10.5)
Chloride: 104 meq/L (ref 96–112)
Creatinine, Ser: 1.05 mg/dL (ref 0.40–1.20)
GFR: 50.78 mL/min — ABNORMAL LOW (ref >60.00)
Glucose, Bld: 86 mg/dL (ref 70–99)
Potassium: 4.2 meq/L (ref 3.5–5.1)
Sodium: 140 meq/L (ref 135–145)
Total Bilirubin: 0.4 mg/dL (ref 0.2–1.2)
Total Protein: 7.4 g/dL (ref 6.0–8.3)

## 2024-02-22 MED ORDER — NA SULFATE-K SULFATE-MG SULF 17.5-3.13-1.6 GM/177ML PO SOLN: 1.0000 | Freq: Once | ORAL | 0 refills | Status: AC

## 2024-02-22 NOTE — Telephone Encounter (Signed)
 [facility] Pre-operative Risk Assessment     Request for surgical clearance:     Endoscopy Procedure  What type of surgery is being performed?     Colonoscopy  When is this surgery scheduled?     03-30-24  What type of clearance is required ?   Pharmacy  Are there any medications that need to be held prior to surgery and how long? Eliquis  2 days prior  Practice name and name of physician performing surgery?      [facility]  What is your office phone and fax number?      Phone- [phone]  Fax- [phone]  Anesthesia type (None, local, MAC, general) ?       MAC   Please route your response to [facility])

## 2024-02-22 NOTE — Progress Notes (Signed)
 Agree with assessment and plan as outlined.

## 2024-02-22 NOTE — Patient Instructions (Addendum)
 _______________________________________________________  If your blood pressure at your visit was 140/90 or greater, please contact your primary care physician to follow up on this.  _______________________________________________________  If you are age 78 or older, your body mass index should be between 23-30. Your Body mass index is 42.6 kg/m. If this is out of the aforementioned range listed, please consider follow up with your Primary Care Provider.  If you are age 72 or younger, your body mass index should be between 19-25. Your Body mass index is 42.6 kg/m. If this is out of the aformentioned range listed, please consider follow up with your Primary Care Provider.   ________________________________________________________  The [facility] providers would like to encourage you to use MYCHART to communicate with providers for non-urgent requests or questions.  Due to long hold times on the telephone, sending your provider a message by [facility] may be a faster and more efficient way to get a response.  Please allow 48 business hours for a response.  Please remember that this is for non-urgent requests.  _______________________________________________________  [facility] is using a team-based approach to care.  Your team is made up of your doctor and two to three APPS. Our APPS (Nurse Practitioners and Physician Assistants) work with your physician to ensure care continuity for you. They are fully qualified to address your health concerns and develop a treatment plan. They communicate directly with your gastroenterologist to care for you. Seeing the Advanced Practice Practitioners on your physician's team can help you by facilitating care more promptly, often allowing for earlier appointments, access to diagnostic testing, procedures, and other specialty referrals.   Your provider has requested that you go to the basement level for lab work before leaving today. Press B on the  elevator. The lab is located at the first door on the left as you exit the elevator.  We have sent the following medications to your pharmacy for you to pick up at your convenience: Suprep  You will be contacted by our office prior to your procedure for directions on holding your Eliquis .  If you do not hear from our office 2 week prior to your scheduled procedure, please call [phone] to discuss.  You have been scheduled for a colonoscopy. Please follow written instructions given to you at your visit today.   If you use inhalers (even only as needed), please bring them with you on the day of your procedure.  DO NOT TAKE 7 DAYS PRIOR TO TEST- Trulicity (dulaglutide) Ozempic, Wegovy (semaglutide) Mounjaro, Zepbound (tirzepatide) Bydureon Bcise (exanatide extended release)  DO NOT TAKE 1 DAY PRIOR TO YOUR TEST Rybelsus (semaglutide) Adlyxin (lixisenatide) Victoza (liraglutide) Byetta (exanatide) ___________________________________________________________________________  Thank you for trusting me with your gastrointestinal care!   Elida Shawl, CRNP

## 2024-02-22 NOTE — Progress Notes (Signed)
 02/22/2024 Terry Koch [id number] 05/04/1945   Chief Complaint: Schedule a colonoscopy  History of Present Illness: Terry Koch is a 78 year old female with a past medical history of hypertension, hyperlipidemia, history of atrial fibrillation s/p multiple cardioversions with the most recent occurred on 03/23/2023 on Eliquis  and Amiodarone , grade 2 diastolic dysfunction, obesity and colon polyps.  She presents today to schedule a colonoscopy.  She is accompanied by her husband.  She underwent a colonoscopy 10/04/2019 which identified 11 polyps removed from the colon, path report consistent with 5 tubular adenomas, 4 sessile serrated polyps and 2 hyperplastic polyps.  She went into A-fib during her colonoscopy and was promptly evaluated by cardiology.  Repeat colonoscopy in 3 years was recommended.  Mother with history of colon polyps.  No known family history of colorectal cancer. She denies having any upper or lower abdominal pain.  Her bowel movements are normal.  No bloody or black stools.  She remains on Eliquis  for A-fib. No NSAID use.  She was last seen by her cardiologist Dr. Ladona 10/19/2023 to obtain cardiac clearance prior to pursuing a colonoscopy.  At that time, Dr. Ladona provided cardiac clearance with Eliquis  hold instructions x 48 hours with routine follow-up in 1 year.  However, a colonoscopy was not scheduled following that appointment.  She wishes to schedule colonoscopy at this time.  She denies having any GERD symptoms or dysphagia.  She denies having chest pain, palpitations, dizziness or shortness of breath.      Latest Ref Rng & Units 01/14/2024    1:47 PM 03/19/2023   10:30 AM 01/27/2023   10:43 AM  CBC  WBC 3.8 - 10.8 Thousand/uL 4.9  5.4  4.5   Hemoglobin 11.7 - 15.5 g/dL 88.8  87.6  87.2   Hematocrit 35.0 - 45.0 % 34.9  40.1  40.6   Platelets 140 - 400 Thousand/uL 190  238  207        Latest Ref Rng & Units 01/14/2024    1:47 PM 03/19/2023    10:30 AM 01/27/2023   10:43 AM  CMP  Glucose 65 - 139 mg/dL 93  867  97   BUN 7 - 25 mg/dL 21  13  17    Creatinine 0.60 - 1.00 mg/dL 8.68  8.83  8.87   Sodium 135 - 146 mmol/L 140  139  140   Potassium 3.5 - 5.3 mmol/L 4.1  4.3  4.1   Chloride 98 - 110 mmol/L 103  105  105   CO2 20 - 32 mmol/L 31  23  27    Calcium  8.6 - 10.4 mg/dL 9.5  9.3  9.4   Total Protein 6.1 - 8.1 g/dL 6.8  7.0    Total Bilirubin 0.2 - 1.2 mg/dL 0.5  0.6    Alkaline Phos 38 - 126 U/L  49    AST 10 - 35 U/L 26  36    ALT 6 - 29 U/L 21  32       Nuclear stress test 10/18/19: Normal myocardial perfusion. Stress LVEF calculated 49%, although visually appears normal. Low risk study.   ECHO 05/25/2023: LVEF 60 to 65%.  Grade 2 diastolic dysfunction.  PAST GI PROCEDURES:  Colonoscopy 10/04/2019: - One 3 mm polyp in the cecum, removed with a cold snare. Resected and retrieved.  - One 3 mm polyp in the ascending colon, removed with a cold snare. Resected and retrieved.  - One 7 mm polyp at the hepatic  flexure, removed with a cold snare. Resected and retrieved.  - Three 3 to 5 mm polyps in the transverse colon, removed with a cold snare. Resected and retrieved.  - One 4 mm polyp in the descending colon, removed with a cold snare. Resected and retrieved.  - Three 4 to 5 mm polyps in the sigmoid colon, removed with a cold snare. Resected and retrieved.  - One 4 mm polyp at the recto-sigmoid colon, removed with a cold snare. Resected and retrieved. - Anal papilla(e) were hypertrophied. - The examination was otherwise normal. - 3 year recall colonoscopy   Surgical [P], colon, sigmoid, transverse, cecal, ascending, hepatic flexure, rectosigmoid, polyp (11) - TUBULAR ADENOMA (FIVE). - NO HIGH GRADE DYSPLASIA OR CARCINOMA. - SESSILE SERRATED POLYP (FOUR) WITHOUT CYTOLOGIC DYSPLASIA. - HYPERPLASTIC POLYP (TWO).   Past Medical History:  Diagnosis Date   Arthritis    self report   Bone fracture    Right Foot   H/O hernia  repair    History of hysterectomy    History of mammogram    Dr.Elizabeth Koch   History of MRI    Dr.Elizabeth Koch.   History of removal of skin mole 03/09/1997   Hyperlipidemia    Hypertension    Persistent atrial fibrillation (HCC)    Scalp hematoma    Wears glasses     Medications Ordered Prior to Encounter[1] Allergies[2]  Current Medications, Allergies, Past Medical History, Past Surgical History, Family History and Social History were reviewed in [employer] record.  Review of Systems:   Constitutional: Negative for fever, sweats, chills or weight loss.  Respiratory: Negative for shortness of breath.   Cardiovascular: Negative for chest pain, palpitations and leg swelling.  Gastrointestinal: See HPI.  Musculoskeletal: Negative for back pain or muscle aches.  Neurological: Negative for dizziness, headaches or paresthesias.   Physical Exam: BP (!) 116/48   Pulse 65   Ht 5' 7 (1.702 m)   Wt 272 lb (123.4 kg)   BMI 42.60 kg/m  General: 78 year old female in no acute distress. Head: Normocephalic and atraumatic. Eyes: No scleral icterus. Conjunctiva pink . Ears: Normal auditory acuity. Mouth: Dentition intact. No ulcers or lesions.  Lungs: Clear throughout to auscultation. Heart: Regular rate and rhythm, no murmur. Abdomen: Soft, nontender and nondistended. No masses or hepatomegaly. Normal bowel sounds x 4 quadrants.  Rectal: Deferred. Musculoskeletal: Symmetrical with no gross deformities. Extremities: No edema. Neurological: Alert oriented x 4. No focal deficits.  Psychological: Alert and cooperative. Normal mood and affect  Assessment and Recommendations:  78 year old female with a history of colon polyps.  Colonoscopy 09/2019 identified 11 polyps removed from the colon, path report consistent with adenomatous, sessile serrated and hyperplastic polyps.  Patient went into A-fib during this procedure.  Mother with history of colon polyps.   No known family history of colorectal cancer.  Cardiac clearance previously received by Dr. Ganji 10/2023 for colonoscopy, patient denies any change in her cardiac status since she was seen by Dr. Ladona. - Colonoscopy benefits and risks discussed including risk with sedation, risk of bleeding, perforation and infection  - Our office will contact Dr. Ladona to verify Eliquis  hold instructions prior to proceeding with a colonoscopy - Patient instructed to avoid dehydration and to drink Gatorade and consume chicken/beef or vegetable broth as part of her clear liquid diet the day before colonoscopy to avoid electrolyte imbalance which could potentially trigger A-fib  Atrial fibrillation s/p multiple cardioversions on Eliquis  and Amiodarone  - See plan  above  Mild normocytic anemia. - CBC, IBC + ferritin  - If the above lab results identify IDA, patient will require EGD at time of colonoscopy          [1]  Current Outpatient Medications on File Prior to Visit  Medication Sig Dispense Refill   acetaminophen  (TYLENOL ) 650 MG CR tablet Take 1,300 mg by mouth in the morning and at bedtime.     amiodarone  (PACERONE ) 200 MG tablet TAKE 1/2 TABLET BY MOUTH EVERY DAY 45 tablet 3   ELIQUIS  5 MG TABS tablet TAKE 1 TABLET BY MOUTH TWICE DAILY 180 tablet 1   Garlic 1000 MG CAPS Take 1,000 mg by mouth as needed. Periodically     loratadine (CLARITIN) 10 MG tablet Take 10 mg by mouth daily as needed for allergies or rhinitis.     metoprolol  succinate (TOPROL -XL) 25 MG 24 hr tablet Take 1 tablet (25 mg total) by mouth in the morning. With or immediately following a meal 90 tablet 2   Misc Natural Products (ELDERBERRY IMMUNE COMPLEX PO) Take 550 mg by mouth in the morning.     Multiple Minerals (CALCIUM /MAGNESIUM/ZINC PO) Take 1 tablet by mouth daily. Vit d3     Multiple Vitamins-Minerals (COMPLETE MULTIVITAMIN/MINERAL PO) Take 1 tablet by mouth in the morning. One a day     Multiple Vitamins-Minerals  (EMERGEN-C IMMUNE PO) Take 5 mLs by mouth daily as needed. With 8 oz of water     Multiple Vitamins-Minerals (EMERGEN-C VITAMIN C) CHEW Chew 2 Chip by mouth daily.     NON FORMULARY Apply 1 application  topically as needed (ankle pain). Body balm with magnesium 12mg      Omega-3 Krill Oil 500 MG CAPS Take 500 mg by mouth in the morning.     OVER THE COUNTER MEDICATION Apply 1 Application topically daily as needed (Pain). veterinary liniment gel     Psyllium 60.3 % POWD Take 5 mLs by mouth daily.     rosuvastatin  (CRESTOR ) 10 MG tablet TAKE 1 TABLET(10 MG) BY MOUTH DAILY 90 tablet 1   spironolactone  (ALDACTONE ) 25 MG tablet Take 1 tablet (25 mg total) by mouth every morning. 90 tablet 2   valsartan -hydrochlorothiazide  (DIOVAN -HCT) 320-25 MG tablet TAKE 1 TABLET BY MOUTH EVERY MORNING 90 tablet 2   vitamin B-12 (CYANOCOBALAMIN) 1000 MCG tablet Take 1,000 mcg by mouth in the morning.     No current facility-administered medications on file prior to visit.  [2]  Allergies Allergen Reactions   Amlodipine  Other (See Comments)    Leg edema

## 2024-02-24 ENCOUNTER — Ambulatory Visit: Payer: Self-pay | Admitting: Nurse Practitioner

## 2024-02-29 NOTE — Telephone Encounter (Signed)
 Patient with diagnosis of afib on Eliquis  for anticoagulation.    What type of surgery is being performed?     Colonoscopy  When is this surgery scheduled?     03-30-24    CHA2DS2-VASc Score = 4   This indicates a 4.8% annual risk of stroke. The patient's score is based upon: CHF History: 0 HTN History: 1 Diabetes History: 0 Stroke History: 0 Vascular Disease History: 0 Age Score: 2 Gender Score: 1    CrCl 48 ml/min Platelet count 190 K  Patient has not had an Afib/aflutter ablation in the last 3 months, DCCV within the last 4 weeks or a watchman implanted in the last 45 days   Per office protocol, patient can hold Eliquis  for 2 days prior to procedure.   Patient will not need bridging with Lovenox (enoxaparin) around procedure.  **This guidance is not considered finalized until pre-operative APP has relayed final recommendations.**

## 2024-03-01 NOTE — Telephone Encounter (Signed)
 LVM

## 2024-03-01 NOTE — Telephone Encounter (Signed)
 Patient made aware and voiced understanding.

## 2024-03-01 NOTE — Telephone Encounter (Signed)
" °  ° °  Primary Cardiologist: Gordy Bergamo, MD  Clinical pharmacist have reviewed patient's chart and made the following recommendations for: [facility]   Patient with diagnosis of afib on Eliquis  for anticoagulation.     What type of surgery is being performed?     Colonoscopy  When is this surgery scheduled?     03-30-24      CHA2DS2-VASc Score = 4   This indicates a 4.8% annual risk of stroke. The patient's score is based upon: CHF History: 0 HTN History: 1 Diabetes History: 0 Stroke History: 0 Vascular Disease History: 0 Age Score: 2 Gender Score: 1     CrCl 48 ml/min Platelet count 190 K   Patient has not had an Afib/aflutter ablation in the last 3 months, DCCV within the last 4 weeks or a watchman implanted in the last 45 days    Per office protocol, patient can hold Eliquis  for 2 days prior to procedure.   Patient will not need bridging with Lovenox (enoxaparin) around procedure.  I will route this recommendation to the requesting party via Epic fax function and remove from pre-op pool.  Please call with questions.  Terry HERO. Secret Kristensen NP-C     03/01/2024, 7:43 AM [facility] [address] [place], [place] [address] Office [phone]      "

## 2024-03-20 NOTE — Telephone Encounter (Signed)
 Patient informed of results and reviewed colonoscopy instructions.

## 2024-03-30 ENCOUNTER — Ambulatory Visit: Admitting: Gastroenterology

## 2024-03-30 ENCOUNTER — Encounter: Payer: Self-pay | Admitting: Gastroenterology

## 2024-03-30 VITALS — BP 118/52 | HR 58 | Temp 97.4°F | Resp 14 | Ht 67.0 in | Wt 272.0 lb

## 2024-03-30 DIAGNOSIS — K5730 Diverticulosis of large intestine without perforation or abscess without bleeding: Secondary | ICD-10-CM

## 2024-03-30 DIAGNOSIS — K648 Other hemorrhoids: Secondary | ICD-10-CM | POA: Diagnosis not present

## 2024-03-30 DIAGNOSIS — Z860101 Personal history of adenomatous and serrated colon polyps: Secondary | ICD-10-CM | POA: Diagnosis not present

## 2024-03-30 DIAGNOSIS — D123 Benign neoplasm of transverse colon: Secondary | ICD-10-CM

## 2024-03-30 DIAGNOSIS — D125 Benign neoplasm of sigmoid colon: Secondary | ICD-10-CM

## 2024-03-30 DIAGNOSIS — K635 Polyp of colon: Secondary | ICD-10-CM

## 2024-03-30 DIAGNOSIS — Z1211 Encounter for screening for malignant neoplasm of colon: Secondary | ICD-10-CM | POA: Diagnosis not present

## 2024-03-30 DIAGNOSIS — Z860100 Personal history of colon polyps, unspecified: Secondary | ICD-10-CM

## 2024-03-30 MED ORDER — SODIUM CHLORIDE 0.9 % IV SOLN: 500.0000 mL | Freq: Once | INTRAVENOUS | Status: DC

## 2024-03-30 NOTE — Patient Instructions (Signed)
 RESUME Eliquis  tomorrow at prior dose.   YOU HAD AN ENDOSCOPIC PROCEDURE TODAY AT THE [facility]:   Refer to the procedure report that was given to you for any specific questions about what was found during the examination.  If the procedure report does not answer your questions, please call your gastroenterologist to clarify.  If you requested that your care partner not be given the details of your procedure findings, then the procedure report has been included in a sealed envelope for you to review at your convenience later.  YOU SHOULD EXPECT: Some feelings of bloating in the abdomen. Passage of more gas than usual.  Walking can help get rid of the air that was put into your GI tract during the procedure and reduce the bloating. If you had a lower endoscopy (such as a colonoscopy or flexible sigmoidoscopy) you may notice spotting of blood in your stool or on the toilet paper. If you underwent a bowel prep for your procedure, you may not have a normal bowel movement for a few days.  Please Note:  You might notice some irritation and congestion in your nose or some drainage.  This is from the oxygen used during your procedure.  There is no need for concern and it should clear up in a day or so.  SYMPTOMS TO REPORT IMMEDIATELY:  Following lower endoscopy (colonoscopy or flexible sigmoidoscopy):  Excessive amounts of blood in the stool  Significant tenderness or worsening of abdominal pains  Swelling of the abdomen that is new, acute  Fever of 100F or higher  For urgent or emergent issues, a gastroenterologist can be reached at any hour by calling [phone]. Do not use MyChart messaging for urgent concerns.    DIET:  We do recommend a small meal at first, but then you may proceed to your regular diet.  Drink plenty of fluids but you should avoid alcoholic beverages for 24 hours.  ACTIVITY:  You should plan to take it easy for the rest of today and you should NOT DRIVE or use  heavy machinery until tomorrow (because of the sedation medicines used during the test).    FOLLOW UP: Our staff will call the number listed on your records the next business day following your procedure.  We will call around 7:15- 8:00 am to check on you and address any questions or concerns that you may have regarding the information given to you following your procedure. If we do not reach you, we will leave a message.     If any biopsies were taken you will be contacted by phone or by letter within the next 1-3 weeks.  Please call us  at [phone] if you have not heard about the biopsies in 3 weeks.    SIGNATURES/CONFIDENTIALITY: You and/or your care partner have signed paperwork which will be entered into your electronic medical record.  These signatures attest to the fact that that the information above on your After Visit Summary has been reviewed and is understood.  Full responsibility of the confidentiality of this discharge information lies with you and/or your care-partner.

## 2024-03-30 NOTE — Op Note (Signed)
 [facility] Patient Name: [facility] Procedure Date: 03/30/2024 9:11 AM MRN: [id number] Endoscopist: Elspeth P. Leigh , MD, [id number] Age: 79 Referring MD:  Date of Birth: 07-Aug-1945 Gender: Female Account #: [id number] Procedure:                Colonoscopy Indications:              High risk colon cancer surveillance: Personal                            history of colonic polyps - 11 polyps removed                            09/2019 - most adenomas / sessile serrated Medicines:                Monitored Anesthesia Care Procedure:                Pre-Anesthesia Assessment:                           - Prior to the procedure, a History and Physical                            was performed, and patient medications and                            allergies were reviewed. The patient's tolerance of                            previous anesthesia was also reviewed. The risks                            and benefits of the procedure and the sedation                            options and risks were discussed with the patient.                            All questions were answered, and informed consent                            was obtained. Prior Anticoagulants: The patient has                            taken Eliquis  (apixaban ), last dose was 2 days                            prior to procedure. ASA Grade Assessment: II - A                            patient with mild systemic disease. After reviewing                            the risks and benefits, the patient was deemed in  satisfactory condition to undergo the procedure.                           After obtaining informed consent, the colonoscope                            was passed under direct vision. Throughout the                            procedure, the patient's blood pressure, pulse, and                            oxygen saturations were monitored continuously. The                             CF [id number] #[id number] was introduced through the anus                            and advanced to the the cecum, identified by                            appendiceal orifice and ileocecal valve. The                            colonoscopy was performed without difficulty. The                            patient tolerated the procedure well. The quality                            of the bowel preparation was good. The ileocecal                            valve, appendiceal orifice, and rectum were                            photographed. Scope In: 9:25:57 AM Scope Out: 9:49:08 AM Scope Withdrawal Time: 0 hours 15 minutes 35 seconds  Total Procedure Duration: 0 hours 23 minutes 11 seconds  Findings:                 The perianal and digital rectal examinations were                            normal.                           Two flat polyps were found in the transverse colon.                            The polyps were 2 to 3 mm in size. These polyps                            were removed with a cold snare. Resection and  retrieval were complete.                           Two sessile polyps were found in the sigmoid colon.                            The polyps were 3 to 4 mm in size. These polyps                            were removed with a cold snare. Resection was                            complete, but the polyp tissue was only partially                            retrieved.                           A few small-mouthed diverticula were found in the                            left colon.                           Internal hemorrhoids were found during retroflexion.                           The exam was otherwise without abnormality. Complications:            No immediate complications. Estimated blood loss:                            Minimal. Estimated Blood Loss:     Estimated blood loss was minimal. Impression:               - Two 2 to 3 mm polyps in  the transverse colon,                            removed with a cold snare. Resected and retrieved.                           - Two 3 to 4 mm polyps in the sigmoid colon,                            removed with a cold snare. Complete resection.                            Partial retrieval.                           - Diverticulosis in the left colon.                           - Internal hemorrhoids.                           -  The examination was otherwise normal. Recommendation:           - Patient has a contact number available for                            emergencies. The signs and symptoms of potential                            delayed complications were discussed with the                            patient. Return to normal activities tomorrow.                            Written discharge instructions were provided to the                            patient.                           - Resume previous diet.                           - Continue present medications.                           - Resume Eliquis  tomorrow.                           - Await pathology results. Likely no further                            surveillance exams are needed due to no high risk                            lesions on this exam and the patient's age (she                            would not likely be due for another exam for                            another 5 years, at which time she will be 79 years                            old) Elspeth P. Leigh, MD 03/30/2024 9:54:33 AM This report has been signed electronically.

## 2024-03-30 NOTE — Progress Notes (Signed)
 Report given to PACU, vss

## 2024-03-30 NOTE — Progress Notes (Signed)
 I have reviewed the patient's medical history in detail and updated the computerized patient record.

## 2024-03-30 NOTE — Progress Notes (Signed)
 Called to room to assist during endoscopic procedure.  Patient ID and intended procedure confirmed with present staff. Received instructions for my participation in the procedure from the performing physician.

## 2024-03-30 NOTE — Progress Notes (Signed)
 [facility] History and Physical   Primary Care Physician:  Caro Harlene POUR, NP   Reason for Procedure:   History of colon polyps  Plan:    colonoscopy     HPI: Kambree D Goodlin is a 79 y.o. female  here for colonoscopy surveillance - 11 polyps removed 09/2019 - adenomas / SSPs.   Patient denies any bowel symptoms at this time. No family history of colon cancer known. Otherwise feels well without any cardiopulmonary symptoms. History of AF, controlled. Eliquis  held for 2 days for this exam.  I have discussed risks / benefits of anesthesia and endoscopic procedure with Meli D Hanko and they wish to proceed with the exams as outlined today.   The patient was provided an opportunity to ask questions and all were answered. The patient agreed with the plan.    Past Medical History:  Diagnosis Date   Arthritis    self report   Bone fracture    Right Foot   H/O hernia repair    History of hysterectomy    History of mammogram    Dr.Elizabeth Gari   History of MRI    Dr.Elizabeth Gari.   History of removal of skin mole 03/09/1997   Hyperlipidemia    Hypertension    Persistent atrial fibrillation (HCC)    Scalp hematoma    Wears glasses     Past Surgical History:  Procedure Laterality Date   ABDOMINAL HYSTERECTOMY     BREAST BIOPSY  03/09/1997   Dr.Finder   BREAST BIOPSY Right 11/07/2019   FIBROADENOMA WITH CALCIFICATIONS   BREAST BIOPSY Left 11/07/2019   FIBROADENOMA WITH CALCIFICATIONS   CARDIOVERSION N/A 12/25/2019   Procedure: CARDIOVERSION;  Surgeon: Ladona Heinz, MD;  Location: [facility] ENDOSCOPY;  Service: Cardiovascular;  Laterality: N/A;   CARDIOVERSION N/A 02/06/2020   Procedure: CARDIOVERSION;  Surgeon: Ladona Heinz, MD;  Location: [facility] ENDOSCOPY;  Service: Cardiovascular;  Laterality: N/A;   CARDIOVERSION N/A 06/10/2022   Procedure: CARDIOVERSION;  Surgeon: Michele Richardson, DO;  Location: [facility] ENDOSCOPY;  Service: Cardiovascular;  Laterality: N/A;    CARDIOVERSION N/A 02/01/2023   Procedure: CARDIOVERSION (CATH LAB);  Surgeon: Kate Lonni CROME, MD;  Location: [facility] INVASIVE CV LAB;  Service: Cardiovascular;  Laterality: N/A;   CARDIOVERSION N/A 03/23/2023   Procedure: CARDIOVERSION;  Surgeon: Raford Riggs, MD;  Location: [facility] INVASIVE CV LAB;  Service: Cardiovascular;  Laterality: N/A;   CHOLECYSTECTOMY     ~ 6 yrs ago   COLONOSCOPY  2009   Dr.Chesley   CYSTECTOMY  09/04/2019   Dr.Gray   GALLBLADDER SURGERY  03/10/2015   Dr.Amid ( [place]  )   POLYPECTOMY     12 polyps removed at [facility]    VENTRAL HERNIA REPAIR  2016    Prior to Admission medications  Medication Sig Start Date End Date Taking? Authorizing Provider  acetaminophen  (TYLENOL ) 650 MG CR tablet Take 1,300 mg by mouth in the morning and at bedtime.   Yes [provider]  amiodarone  (PACERONE ) 200 MG tablet TAKE 1/2 TABLET BY MOUTH EVERY DAY 12/14/23  Yes Ladona Heinz, MD  loratadine (CLARITIN) 10 MG tablet Take 10 mg by mouth daily as needed for allergies or rhinitis.   Yes [provider]  metoprolol  succinate (TOPROL -XL) 25 MG 24 hr tablet Take 1 tablet (25 mg total) by mouth in the morning. With or immediately following a meal 08/04/23  Yes Ladona Heinz, MD  Multiple Minerals (CALCIUM /MAGNESIUM/ZINC PO) Take 1 tablet by mouth daily. Vit d3  Yes [provider]  Multiple Vitamins-Minerals (COMPLETE MULTIVITAMIN/MINERAL PO) Take 1 tablet by mouth in the morning. One a day   Yes [provider]  Multiple Vitamins-Minerals (EMERGEN-C IMMUNE PO) Take 5 mLs by mouth daily as needed. With 8 oz of water   Yes [provider]  Multiple Vitamins-Minerals (EMERGEN-C VITAMIN C) CHEW Chew 2 Chip by mouth daily.   Yes [provider]  Psyllium 60.3 % POWD Take 5 mLs by mouth daily.   Yes [provider]  rosuvastatin  (CRESTOR ) 10 MG tablet TAKE 1 TABLET(10 MG) BY MOUTH DAILY 10/19/23  Yes Eubanks, Jessica K, NP   spironolactone  (ALDACTONE ) 25 MG tablet Take 1 tablet (25 mg total) by mouth every morning. 12/31/23  Yes Ladona Heinz, MD  valsartan -hydrochlorothiazide  (DIOVAN -HCT) 320-25 MG tablet TAKE 1 TABLET BY MOUTH EVERY MORNING 12/31/23  Yes Ladona Heinz, MD  vitamin B-12 (CYANOCOBALAMIN) 1000 MCG tablet Take 1,000 mcg by mouth in the morning.   Yes [provider]  ELIQUIS  5 MG TABS tablet TAKE 1 TABLET BY MOUTH TWICE DAILY 12/14/23   Ladona Heinz, MD  Garlic 1000 MG CAPS Take 1,000 mg by mouth as needed. Periodically Patient not taking: Reported on 03/30/2024    [provider]  Misc Natural Products (ELDERBERRY IMMUNE COMPLEX PO) Take 550 mg by mouth in the morning.    [provider]  NON FORMULARY Apply 1 application  topically as needed (ankle pain). Body balm with magnesium 12mg     [provider]  Omega-3 Krill Oil 500 MG CAPS Take 500 mg by mouth in the morning.    [provider]  OVER THE COUNTER MEDICATION Apply 1 Application topically daily as needed (Pain). veterinary liniment gel    [provider]    Current Outpatient Medications  Medication Sig Dispense Refill   acetaminophen  (TYLENOL ) 650 MG CR tablet Take 1,300 mg by mouth in the morning and at bedtime.     amiodarone  (PACERONE ) 200 MG tablet TAKE 1/2 TABLET BY MOUTH EVERY DAY 45 tablet 3   loratadine (CLARITIN) 10 MG tablet Take 10 mg by mouth daily as needed for allergies or rhinitis.     metoprolol  succinate (TOPROL -XL) 25 MG 24 hr tablet Take 1 tablet (25 mg total) by mouth in the morning. With or immediately following a meal 90 tablet 2   Multiple Minerals (CALCIUM /MAGNESIUM/ZINC PO) Take 1 tablet by mouth daily. Vit d3     Multiple Vitamins-Minerals (COMPLETE MULTIVITAMIN/MINERAL PO) Take 1 tablet by mouth in the morning. One a day     Multiple Vitamins-Minerals (EMERGEN-C IMMUNE PO) Take 5 mLs by mouth daily as needed. With 8 oz of water     Multiple Vitamins-Minerals (EMERGEN-C  VITAMIN C) CHEW Chew 2 Chip by mouth daily.     Psyllium 60.3 % POWD Take 5 mLs by mouth daily.     rosuvastatin  (CRESTOR ) 10 MG tablet TAKE 1 TABLET(10 MG) BY MOUTH DAILY 90 tablet 1   spironolactone  (ALDACTONE ) 25 MG tablet Take 1 tablet (25 mg total) by mouth every morning. 90 tablet 2   valsartan -hydrochlorothiazide  (DIOVAN -HCT) 320-25 MG tablet TAKE 1 TABLET BY MOUTH EVERY MORNING 90 tablet 2   vitamin B-12 (CYANOCOBALAMIN) 1000 MCG tablet Take 1,000 mcg by mouth in the morning.     ELIQUIS  5 MG TABS tablet TAKE 1 TABLET BY MOUTH TWICE DAILY 180 tablet 1   Garlic 1000 MG CAPS Take 1,000 mg by mouth as needed. Periodically (Patient not taking: Reported on  03/30/2024)     Misc Natural Products (ELDERBERRY IMMUNE COMPLEX PO) Take 550 mg by mouth in the morning.     NON FORMULARY Apply 1 application  topically as needed (ankle pain). Body balm with magnesium 12mg      Omega-3 Krill Oil 500 MG CAPS Take 500 mg by mouth in the morning.     OVER THE COUNTER MEDICATION Apply 1 Application topically daily as needed (Pain). veterinary liniment gel     Current Facility-Administered Medications  Medication Dose Route Frequency Provider Last Rate Last Admin   0.9 %  sodium chloride  infusion  500 mL Intravenous Once Keslee Harrington, Elspeth SQUIBB, MD        Allergies as of 03/30/2024 - Review Complete 03/30/2024  Allergen Reaction Noted   Amlodipine  Other (See Comments) 04/18/2021    Family History  Problem Relation Age of Onset   Breast cancer Mother    Alzheimer's disease Mother    Dementia Mother    Parkinson's disease Mother    Colon polyps Mother    Stroke Father    Dementia Father    Breast cancer Sister    Allergies Daughter    Allergies Daughter    Allergies Son    Colon cancer Neg Hx    Esophageal cancer Neg Hx    Stomach cancer Neg Hx    Rectal cancer Neg Hx     Social History   Socioeconomic History   Marital status: Married    Spouse name: Marinda    Number of children: 3    Years of education: 20   Highest education level: Not on file  Occupational History   Not on file  Tobacco Use   Smoking status: Never   Smokeless tobacco: Never   Tobacco comments:    Never smoked 01/05/23  Vaping Use   Vaping status: Never Used  Substance and Sexual Activity   Alcohol use: Yes    Alcohol/week: 1.0 standard drink of alcohol    Types: 1 Glasses of wine per week    Comment: occasionally   Drug use: Never   Sexual activity: Not Currently  Other Topics Concern   Not on file  Social History Narrative   Tobacco use, amount per day now: N/A   Past tobacco use, amount per day: N/A   How many years did you use tobacco: N/A   Alcohol use (drinks per week): Very Limited.   Diet: Regular Vegetables, Meat, Fruits, Milk, Water, and Desserts.   Do you drink/eat things with caffeine: Yes   Marital status:  Married                                What year were you married? 1969   Do you live in a house, apartment, assisted living, condo, trailer, etc.? House.   Is it one or more stories? Ranch ( 1 story )   How many persons live in your home? 2   Do you have pets in your home?( please list) No.    Highest Level of education completed: Graduate MED   Current or past profession: Education   Do you exercise?  N/A                                   Type and how often?   Do you have a living will? Yes   Do  you have a DNR form?   No                                If not, do you want to discuss one?   Do you have signed POA/HPOA forms?  No                      If so, please bring to you appointment    Do you have any difficulty bathing or dressing yourself? No.   Do you have difficulty preparing food or eating? No.   Do you have difficulty managing your medications? No.   Do you have any difficulty managing your finances? No.   Do you have any difficulty affording your medications? No.      Social Drivers of Health   Tobacco Use: Low Risk (03/30/2024)   Patient History     Smoking Tobacco Use: Never    Smokeless Tobacco Use: Never    Passive Exposure: Not on file  Financial Resource Strain: Not on file  Food Insecurity: Not on file  Transportation Needs: Not on file  Physical Activity: Not on file  Stress: Not on file  Social Connections: Not on file  Intimate Partner Violence: Not on file  Depression (PHQ2-9): Low Risk (01/14/2024)   Depression (PHQ2-9)    PHQ-2 Score: 0  Alcohol Screen: Not on file  Housing: Not on file  Utilities: Not on file  Health Literacy: Not on file    Review of Systems: All other review of systems negative except as mentioned in the HPI.  Physical Exam: Vital signs BP (!) 147/74   Pulse 60   Temp (!) 97.4 F (36.3 C) (Temporal)   Ht 5' 7 (1.702 m)   Wt 272 lb (123.4 kg)   SpO2 97%   BMI 42.60 kg/m   General:   Alert,  Well-developed, pleasant and cooperative in NAD Lungs:  Clear throughout to auscultation.   Heart:  Regular rate and rhythm Abdomen:  Soft, nontender and nondistended.   Neuro/Psych:  Alert and cooperative. Normal mood and affect. A and O x 3  Marcey Naval, MD [facility]

## 2024-03-30 NOTE — Progress Notes (Signed)
 0937 BP 187/96, Labetalol given IV, MD update, vss

## 2024-03-30 NOTE — Progress Notes (Signed)
 9071  Pt experienced laryngeal spasm with jaw thrust performed. Nasopharyngeal airway size   8.0 placed without trauma, vss

## 2024-03-31 ENCOUNTER — Telehealth: Payer: Self-pay | Admitting: Gastroenterology

## 2024-03-31 ENCOUNTER — Telehealth: Payer: Self-pay

## 2024-03-31 NOTE — Telephone Encounter (Signed)
 Attempted to reach patient for follow up phone call. No answer, unable to leave voicemail due to busy line.

## 2024-03-31 NOTE — Telephone Encounter (Signed)
 Patient states she is doing okay. States she had an amazing experience from the beginning to the end and was very adult nurse.

## 2024-04-05 ENCOUNTER — Ambulatory Visit: Payer: Self-pay | Admitting: Gastroenterology

## 2024-04-05 LAB — SURGICAL PATHOLOGY

## 2024-05-04 ENCOUNTER — Encounter: Payer: Medicare Other | Admitting: Nurse Practitioner

## 2024-05-19 ENCOUNTER — Ambulatory Visit: Admitting: Nurse Practitioner
